# Patient Record
Sex: Male | Born: 1945 | Race: White | Hispanic: No | Smoking: Never smoker
Health system: Southern US, Community
[De-identification: ages and names within clinical notes are randomized; demographics above are authoritative.]

## PROBLEM LIST (undated history)

## (undated) DIAGNOSIS — M4800 Spinal stenosis, site unspecified: Secondary | ICD-10-CM

## (undated) DIAGNOSIS — C61 Malignant neoplasm of prostate: Secondary | ICD-10-CM

## (undated) DIAGNOSIS — J309 Allergic rhinitis, unspecified: Secondary | ICD-10-CM

## (undated) DIAGNOSIS — E119 Type 2 diabetes mellitus without complications: Secondary | ICD-10-CM

## (undated) DIAGNOSIS — I2510 Atherosclerotic heart disease of native coronary artery without angina pectoris: Secondary | ICD-10-CM

## (undated) DIAGNOSIS — N529 Male erectile dysfunction, unspecified: Secondary | ICD-10-CM

## (undated) DIAGNOSIS — N62 Hypertrophy of breast: Secondary | ICD-10-CM

## (undated) DIAGNOSIS — I10 Essential (primary) hypertension: Secondary | ICD-10-CM

## (undated) DIAGNOSIS — K219 Gastro-esophageal reflux disease without esophagitis: Secondary | ICD-10-CM

## (undated) DIAGNOSIS — Z9289 Personal history of other medical treatment: Secondary | ICD-10-CM

## (undated) DIAGNOSIS — E785 Hyperlipidemia, unspecified: Secondary | ICD-10-CM

## (undated) DIAGNOSIS — E669 Obesity, unspecified: Secondary | ICD-10-CM

## (undated) DIAGNOSIS — M1990 Unspecified osteoarthritis, unspecified site: Secondary | ICD-10-CM

## (undated) HISTORY — DX: Personal history of other medical treatment: Z92.89

## (undated) HISTORY — DX: Allergic rhinitis, unspecified: J30.9

## (undated) HISTORY — DX: Unspecified osteoarthritis, unspecified site: M19.90

## (undated) HISTORY — DX: Hypertrophy of breast: N62

## (undated) HISTORY — DX: Obesity, unspecified: E66.9

## (undated) HISTORY — PX: LAPAROSCOPIC CHOLECYSTECTOMY: SUR755

## (undated) HISTORY — DX: Male erectile dysfunction, unspecified: N52.9

## (undated) HISTORY — DX: Hyperlipidemia, unspecified: E78.5

## (undated) HISTORY — DX: Atherosclerotic heart disease of native coronary artery without angina pectoris: I25.10

## (undated) HISTORY — DX: Malignant neoplasm of prostate: C61

## (undated) HISTORY — DX: Spinal stenosis, site unspecified: M48.00

---

## 2001-06-19 ENCOUNTER — Encounter: Payer: Self-pay | Admitting: Emergency Medicine

## 2001-06-19 ENCOUNTER — Emergency Department (HOSPITAL_COMMUNITY): Admission: EM | Admit: 2001-06-19 | Discharge: 2001-06-19 | Payer: Self-pay | Admitting: Emergency Medicine

## 2004-06-02 ENCOUNTER — Ambulatory Visit (HOSPITAL_COMMUNITY): Admission: RE | Admit: 2004-06-02 | Discharge: 2004-06-02 | Payer: Self-pay | Admitting: Gastroenterology

## 2008-12-03 ENCOUNTER — Observation Stay (HOSPITAL_COMMUNITY): Admission: EM | Admit: 2008-12-03 | Discharge: 2008-12-04 | Payer: Self-pay | Admitting: Emergency Medicine

## 2008-12-03 ENCOUNTER — Ambulatory Visit: Payer: Self-pay | Admitting: Internal Medicine

## 2009-07-20 ENCOUNTER — Emergency Department (HOSPITAL_COMMUNITY): Admission: EM | Admit: 2009-07-20 | Discharge: 2009-07-21 | Payer: Self-pay | Admitting: Emergency Medicine

## 2009-07-31 ENCOUNTER — Inpatient Hospital Stay (HOSPITAL_COMMUNITY): Admission: EM | Admit: 2009-07-31 | Discharge: 2009-08-02 | Payer: Self-pay | Admitting: Emergency Medicine

## 2009-07-31 ENCOUNTER — Encounter (INDEPENDENT_AMBULATORY_CARE_PROVIDER_SITE_OTHER): Payer: Self-pay | Admitting: General Surgery

## 2011-03-17 LAB — COMPREHENSIVE METABOLIC PANEL
ALT: 229 U/L — ABNORMAL HIGH (ref 0–53)
AST: 58 U/L — ABNORMAL HIGH (ref 0–37)
Albumin: 2.8 g/dL — ABNORMAL LOW (ref 3.5–5.2)
Albumin: 3 g/dL — ABNORMAL LOW (ref 3.5–5.2)
Albumin: 3.7 g/dL (ref 3.5–5.2)
Alkaline Phosphatase: 121 U/L — ABNORMAL HIGH (ref 39–117)
Alkaline Phosphatase: 89 U/L (ref 39–117)
BUN: 20 mg/dL (ref 6–23)
CO2: 23 mEq/L (ref 19–32)
CO2: 27 mEq/L (ref 19–32)
Calcium: 8.4 mg/dL (ref 8.4–10.5)
Chloride: 102 mEq/L (ref 96–112)
Creatinine, Ser: 1.26 mg/dL (ref 0.4–1.5)
GFR calc Af Amer: >60 mL/min (ref >60)
GFR calc non Af Amer: 51 mL/min — ABNORMAL LOW (ref >60)
GFR calc non Af Amer: 58 mL/min — ABNORMAL LOW (ref >60)
Glucose, Bld: 182 mg/dL — ABNORMAL HIGH (ref 70–99)
Potassium: 3.4 mEq/L — ABNORMAL LOW (ref 3.5–5.1)
Potassium: 3.8 mEq/L (ref 3.5–5.1)
Sodium: 143 mEq/L (ref 135–145)
Total Bilirubin: 3.5 mg/dL — ABNORMAL HIGH (ref 0.3–1.2)
Total Protein: 6.2 g/dL (ref 6.0–8.3)

## 2011-03-17 LAB — CBC
HCT: 34.4 % — ABNORMAL LOW (ref 39.0–52.0)
MCHC: 34.9 g/dL (ref 30.0–36.0)
MCV: 93.2 fL (ref 78.0–100.0)
Platelets: 163 10*3/uL (ref 150–400)
Platelets: 165 10*3/uL (ref 150–400)
WBC: 13.4 10*3/uL — ABNORMAL HIGH (ref 4.0–10.5)

## 2011-03-17 LAB — DIFFERENTIAL
Lymphocytes Relative: 5 % — ABNORMAL LOW (ref 12–46)
Lymphs Abs: 0.6 10*3/uL — ABNORMAL LOW (ref 0.7–4.0)
Neutrophils Relative %: 90 % — ABNORMAL HIGH (ref 43–77)

## 2011-03-17 LAB — GLUCOSE, CAPILLARY

## 2011-03-17 LAB — PROTIME-INR
INR: 1.3 (ref 0.00–1.49)
Prothrombin Time: 16.5 seconds — ABNORMAL HIGH (ref 11.6–15.2)

## 2011-03-17 LAB — POCT CARDIAC MARKERS: Troponin i, poc: <0.05 ng/mL (ref 0.00–0.09)

## 2011-04-24 NOTE — H&P (Signed)
NAME:  Andrew Williamson, Andrew Williamson NO.:  [id number]   MEDICAL RECORD NO.:  [id number]          PATIENT TYPE:  INP   LOCATION:  5127                         FACILITY:  [facility]   PHYSICIAN:  Cherylynn Ridges, M.D.    DATE OF BIRTH:  1946-07-08   DATE OF ADMISSION:  07/31/2009  DATE OF DISCHARGE:                              HISTORY & PHYSICAL   IDENTIFICATION/CHIEF COMPLAINT:  The patient is a 65 year old gentleman  with abdominal pain in the epigastrium and possible cholangitis and  acute cholecystitis.   HISTORY OF PRESENT ILLNESS:  The patient has had 3 episodes of  epigastric abdominal pain with nausea, fevers, and chills.  The first  one was in December 2009.  His last episode was about 2 weeks ago where  he ended up going through the ER at [facility] where  laboratory studies sent over from the outpatient urgent care center did  not demonstrate any abnormality and then his last attack started  yesterday evening,  worsened,  until today he went in to be seen in the  urgent care center at the [facility] at [facility], was  found to have a fever and LFTs demonstrating abnormal LFTs.  He was  tachycardic.  He had a fever of 101 and he was sent to [facility] for a CT scan and subsequent workup.   His past medical history is significant only for:  1. Hypertension.  2. Diabetes for which he is currently not taking any medications.  3. Reflux.   PAST SURGICAL HISTORY:  He has only had wisdom teeth removed and he has  had a colonoscopy.  No other operation.   ALLERGIES:  He is allergic to  ? No other medication.   CURRENT MEDICATIONS:  1. Lisinopril 10 mg a day.  2. Omeprazole.  3. Baby aspirin a day.   REVIEW OF SYSTEMS:  The patient has had dark and maybe yellowish stools,  but cannot confirm that.  He has possibly scleral icterus.  He has had  some chills and fevers up to 101.7 taken by him at home.   PHYSICAL EXAMINATION:  VITAL  SIGNS:  On exam here, his maximum  temperature has been 101.0, he is currently 99.7.  He is complaining of  no pain, maybe a 1-2/10.  HEENT:  He is normocephalic and atraumatic.  He does seem to have a mild  scleral icterus.  NECK:  Supple.  No bruits and no palpable masses.  LUNGS:  Clear to auscultation.  CARDIAC:  Regular rhythm.  He is tachycardic at about 102.  No murmurs,  gallops, or heaves.  ABDOMEN:  Soft, nontender.  No palpable masses.  Nonpalpable spleen or  liver.  No rebound or guarding.  No palpable hernias.  RECTAL:  Not performed.  NEUROLOGIC:  Cranial nerves II through XII are grossly intact.  Deep  tendon reflexes are symmetrical and brisk bilaterally.  EXTREMITIES:  He has got no cyanosis, clubbing, or edema.  He has got no  adenopathy.  PSYCHIATRIC:  He has got normal mentation, normal mood.  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   LABORATORY STUDIES:  His LFTs are elevated globally with an alk phos  over 140, AST and ALT which are elevated, and a total bilirubin 3.5,  lipase is normal.  His white count was 81191 done at the urgent care  clinic, hemoglobin 13, hematocrit 40.  CT scan demonstrates some  pericholecystic fluid and also some fluid around his appendix which  could be run down from the right upper quadrant.   IMPRESSION:  Based on my examination, the patient does not have  peritonitis, probably has some subacute cholecystitis __________  cholangitis with a rising LFTs and total bilirubin.   PLAN:  Get a GI consultation in case the patient needs preoperative  ERCP.  There is no indication that his duct is dilated on the  ultrasound, however, an official report is pending.  CT scan  demonstrating fluid in and around the gallbladder and also in the right  lower quadrant seems to probably be run down from the gallbladder fossa;  however, the appendix can be visualized laparoscopically when the  patient's gallbladder is removed.  The plan is to get GI to  see the  patient and then he will need a laparoscopic cholecystectomy.  I do  think that it can be done laparoscopically.  In the meantime, we will  place him on IV antibiotics.  He has got a doses of ciprofloxacin and  Flagyl.  I think the Zosyn would be fine and we will only put him on  single therapy of Zosyn.  We will see him again tomorrow.  He can have  clear liquids up until midnight, then he can be n.p.o. after midnight  except for sips of his medication.      Cherylynn Ridges, M.D.  Electronically Signed     JOW/MEDQ  D:  07/31/2009  T:  08/01/2009  Job:  [id number]

## 2011-04-24 NOTE — Op Note (Signed)
NAME:  Andrew Williamson, FAHR NO.:  [id number]   MEDICAL RECORD NO.:  [id number]           PATIENT TYPE:   LOCATION:                                 FACILITY:   PHYSICIAN:  Gabrielle Dare. Janee Morn, M.D.DATE OF BIRTH:  12-07-1946   DATE OF PROCEDURE:  08/01/2009  DATE OF DISCHARGE:                               OPERATIVE REPORT   PREOPERATIVE DIAGNOSES:  1. Acute cholecystitis.  2. Possible choledocholithiasis.   POSTOPERATIVE DIAGNOSIS:  Acute cholecystitis.   PROCEDURE:  Laparoscopic cholecystectomy with intraoperative  cholangiogram.   SURGEON:  Gabrielle Dare. Janee Morn, MD   ASSISTANT:  Brayton El, PA-C   ANESTHESIA:  General endotracheal.   HISTORY OF PRESENT ILLNESS:  Mr. Reuss is a 65 year old gentleman who  was admitted to the hospital yesterday with abdominal pain.  He had  elevated transaminases and elevated bilirubin.  He was treated with  intravenous antibiotics and bowel rest.  GI consultation was requested.  This morning, his transaminases are down somewhat, but still abnormal.  Bilirubin is about the same.  We are proceeding with laparoscopic  cholecystectomy with intraoperative cholangiogram for acute  cholecystitis and possible choledocholithiasis.   PROCEDURE IN DETAIL:  Informed consent was obtained.  The patient was  identified in the preop holding area.  He was currently receiving  intravenous antibiotics.  He was brought to the operating room.  General  endotracheal anesthesia was administered by the anesthesia staff.  His  abdomen was prepped and draped in sterile fashion.  We performed a time-  out procedure.  Infraumbilical region was infiltrated with 0.25%  Marcaine with epinephrine.  Infraumbilical incision was made.  Subcutaneous tissues were dissected down revealing the anterior fascia.  This was divided sharply along the midline.  The peritoneal cavity was  entered under direct vision without difficulty. A 0 Vicryl pursestring  suture was  placed around the fascial opening and a Hasson trocar was  inserted into the abdomen.  The abdomen was insufflated with carbon  dioxide in standard fashion.  Under direct vision, an 11-mm epigastric  and two 5-mm lateral ports were placed.  0.25% Marcaine with epinephrine  was used in all port sites.  Laparoscopic exploration revealed a lot of  filmy omental adhesions on the dome of the gallbladder.  These were  carefully taken down using sharp dissection and cautery to achieve good  hemostasis.  This revealed the dome of the gallbladder, which was  retracted superomedially.  There were further filmy omental adhesions  were present along the length of the gallbladder.  These were gradually  and carefully swept down revealing the infundibulum, this was retracted  inferolaterally.  Further dissection swept off the other adhesions  exposed the tapering portion of the infundibulum.  Careful dissection  was begun laterally and progressed medially identifying the cystic duct  and the cystic artery.  Dissection continued until a distal large window  was created between the infundibulum of the gallbladder, cystic duct,  and the liver.  Once we had excellent visualization, a clip was placed  on the infundibular cystic duct junction.  Small nick was made  in the  cystic duct, it was milked out, and there was a bit of grunge in the  bile, but there were no large stones.  Reddick cholangiogram catheter  was inserted and intraoperative cholangiogram was obtained demonstrating  no common bile duct filling defects and good flow of contrast into the  duodenum.  Cholangiogram catheter was removed.  Three clips were  initially placed proximally on the cystic duct and then it was divided.  Upon inspection, however, these clips did not completely traverse with  the cystic duct, so the middle one was removed and cystic duct was  closed with an Endoloop of 0 PDS.  This achieved excellent closure.  At  this time,  the cystic artery was clipped twice proximally, once  distally, and divided.  Gallbladder was then taken off the liver bed  with Bovie cautery achieving excellent hemostasis along the way.  Gallbladder was placed in an Endocatch bag and removed from the abdomen  via the infraumbilical port site.  Liver bed was rechecked and  meticulous hemostasis was ensured.  Clips remained in good position and  the bed was dry.  The area was copiously irrigated and the irrigation  fluid returned clear.  There were 2 small areas of bleeding on the  omentum from where the adhesions had been, these were carefully  cauterized.  Checking underlying structures, the liver bed was rechecked  and was completely dry.  The remainder of the irrigation fluid was  evacuated, the Hasson trocar was removed, and the infraumbilical fascia  was closed under direct vision by tying a 0 Vicryl pursestring suture.  An additional figure-of-eight 0 Vicryl pursestring suture was placed  achieving excellent closure.  The pneumoperitoneum was then released  after removing the ports under direct vision.  All 4 wounds were  copiously irrigated, the skin of the infraumbilical and epigastric  wounds were closed with running 4-0 Vicryl subcuticular stitch, and all  4 wounds were closed with Dermabond.  Sponge, needle, and instrument  counts were all correct.  The patient tolerated the procedure well  without apparent complication.  He was taken to recovery room in stable  condition.  We will check followup CMET in the morning.      Gabrielle Dare Janee Morn, M.D.  Electronically Signed     BET/MEDQ  D:  08/01/2009  T:  08/02/2009  Job:  [id number]   cc:   Venita Lick. Russella Dar, MD, Clementeen Graham

## 2011-04-24 NOTE — H&P (Signed)
NAME:  Andrew Williamson, QUAM NO.:  [id number]   MEDICAL RECORD NO.:  [id number]          PATIENT TYPE:  EMS   LOCATION:  ED                           FACILITY:  [facility]   PHYSICIAN:  Gardiner Barefoot, MD    DATE OF BIRTH:  1946-10-31   DATE OF ADMISSION:  12/03/2008  DATE OF DISCHARGE:                              HISTORY & PHYSICAL   CHIEF COMPLAINTS:  Fever and chills.   HISTORY OF PRESENT ILLNESS:  This is a 65 year old male with a history  of hypertension and diet-controlled diabetes who presented with a fever  102 with chills, started the evening of presentation.  The patient  complains of epigastric bloating, which is consistent with his acid  reflux and some back soreness bilaterally with lower back, which he  equates to playing football with some other family members.  The patient  reports no sick contacts and no travel.  The patient did have emesis one  time when he presented to the emergency room. The patient, otherwise,  feels well and at this time is not having any chills.  The patient also  noted his blood pressure was elevated at home, higher than his typical  baseline.  The patient reports he has not heard of any other family  members that have been sick.   PAST MEDICAL HISTORY:  1. Hypertension.  2. Diabetes.  3. Hypercholesterolemia.  4. Acid reflux disease.   MEDICATIONS:  1. Lisinopril 10 mg daily.  2. Protonix 40 mg daily.  3. Aspirin 81 mg daily.  4. Pravastatin 10 mg daily.   ALLERGIES:  NO KNOWN DRUG ALLERGIES.   FAMILY HISTORY:  Noncontributory.   SOCIAL HISTORY:  No tobacco and occasional alcohol.   REVIEW OF SYSTEMS:  Negative except as per history of present illness.   PHYSICAL EXAM:  VITALS:  Temperature is 102.4, pulse 110-131,  respirations 18-20, blood pressure was 160/102 initially and most recent  is 107/42, O2 sats 95%.  GENERAL:  The patient is awake, alert, oriented x3, appears in no acute  distress does not appear ill,  quite pleasant.  CARDIOVASCULAR:  Regular  rate and rhythm with no murmurs, rubs or gallops.  LUNGS: Clear to auscultation bilaterally.  ABDOMEN: Soft, nontender, nondistended with positive bowel sounds.  No  hepatosplenomegaly.  EXTREMITIES:  No cyanosis, clubbing or edema.   LABORATORY DATA:  Sodium 138, potassium 4.8, chloride 105, bicarb 25,  BUN 22, creatinine 1.5, glucose 147, albumin 4.4, AST 287, ALT 296, alk  phos is 71, total bili is 1.9.  WBCs 12.4 with 89% neutrophils,  hemoglobin 15, lipase 17.  Ultrasound was normal.   IMPRESSION AND PLAN:  1. Transaminitis.  This may be secondary to a gastroenteritis process      with elevated AST, ALT.  He is minimally elevated T bili and alk      phos is normal so unlikely a gallbladder process.  His ultrasound      is normal as well.  I suspect this is a transient process as he now      obviously feels better.  Other  possibilities include medications,      particularly pravastatin, which can cause transaminitis as well and      therefore, if it continues to increase, this may need to be held.      Of note, the patient did complain of some back pain; however, it      does not radiate from the epigastric pain does not radiate.  It      does feel like a muscular pain that he equates to having done      exercise with his family members during the daytime.  2. Hypertension.  The patient's blood pressure has been elevated much      above his typical baseline, more or less tachycardic. I suspect      this is secondary to his acute illness; however, this will be      monitored today well.  He will continue his home medications.  3. Diabetes.  This is diet-controlled, his blood sugar is mildly      elevated.  We will continue to monitor that; however, will hold off      on any new medications.  4. DVT prophylaxis with Lovenox.      Gardiner Barefoot, MD  Electronically Signed     RWC/MEDQ  D:  12/03/2008  T:  12/03/2008  Job:   [id number]   cc:   Oley Balm. Georgina Pillion, M.D.  Fax: [phone]

## 2011-04-24 NOTE — Discharge Summary (Signed)
NAME:  [name], [name]:  [id number]   MEDICAL RECORD NO.:  [id number]          PATIENT TYPE:  INP   LOCATION:  5127                         FACILITY:  [facility]   PHYSICIAN:  Gabrielle Dare. Janee Morn, M.D.DATE OF BIRTH:  1946-07-04   DATE OF ADMISSION:  07/31/2009  DATE OF DISCHARGE:                               DISCHARGE SUMMARY   DISCHARGING PHYSICIAN:  Gabrielle Dare. Janee Morn, MD   HISTORY OF PRESENT ILLNESS:  Andrew Williamson is a patient who is a 65-year-  old male who presented with abdominal pain and nausea and vomiting with  fever and chills.  He was worked up in the emergency room including a CT  scan, which subsequently showed evidence of pericholecystic fluid and  elevated white blood cell count of 17.9 indicating acute cholecystitis.  However, his LFTs were elevated including bilirubin of 3.5.  It was  thought that Gastroenterology may need to be consulted for preoperative  ERCP.  There was no indication that his duct was dilated; however, an  official report was pending and GI was supposedly consulted by the  emergency room physician.   SUMMARY OF HOSPITAL COURSE:  The patient was admitted on July 31, 2009, started on IV fluids and antibiotics; however, on the morning of  August 01, 2009, after seeing the patient, gastroenterologist had not  seen the patient yet, the patient was feeling better and his enzymes  were slightly better.  The decision at that point was made to just go  ahead and perform laparoscopic cholecystectomy with intraoperative  cholangiogram and if necessary postoperative ERCP could be performed.   Procedure performed on August 01, 2009, was laparoscopic cholecystectomy  with intraoperative cholangiogram.  No evidence of common duct  obstruction was identified during that procedure.  The patient tolerated  this well and was admitted to the floor in a stable condition.  Postoperatively, the patient has had no complications.  His total  bilirubin  has dropped down to 1.3 and his other liver enzymes are  essentially normalized.  He is feeling well and tolerating regular diet  and is determined to be stable for discharge as of August 02, 2009.   DISCHARGE DIAGNOSIS:  Acute cholecystitis suspicious for passed common  duct stone, status post laparoscopic cholecystectomy.   PLAN:  The patient will follow up in our office in approximately 2  weeks.  We will otherwise follow discharge instructions and activity  restrictions and he will be given prescription for Percocet pain  medicine to be used as needed for severe pain.     Brayton El, PA-C      Gabrielle Dare. Janee Morn, M.D.  Electronically Signed   KB/MEDQ  D:  08/02/2009  T:  08/02/2009  Job:  [id number]

## 2011-04-27 NOTE — Op Note (Signed)
NAME:  Andrew Williamson, Andrew Williamson                        ACCOUNT NO.:  [id number]   MEDICAL RECORD NO.:  [id number]                   PATIENT TYPE:  AMB   LOCATION:  ENDO                                 FACILITY:  [facility]   PHYSICIAN:  John C. Madilyn Fireman, M.D.                 DATE OF BIRTH:  08-03-1946   DATE OF PROCEDURE:  06/02/2004  DATE OF DISCHARGE:                                 OPERATIVE REPORT   PROCEDURE:  Colonoscopy.   INDICATIONS FOR PROCEDURE:  Average risk colon cancer screening.   DESCRIPTION OF PROCEDURE:  The patient was placed in the left lateral  decubitus position and placed on the pulse monitor with continuous low-flow  oxygen delivered by nasal cannula.  He was sedated with 75 mcg IV fentanyl  and 8 mg IV Versed.  The Olympus video colonoscope was inserted into the  rectum and advanced to the cecum, confirmed by transillumination at  McBurney's point and visualization of the ileocecal valve and appendiceal  orifice.  Prep was excellent.  The cecum, ascending, transverse, descending,  and sigmoid colon all appeared normal with no masses, polyps, diverticula,  or other mucosal abnormalities.  The rectum likewise appeared normal, and  retroflexed view of the anus revealed no obvious internal hemorrhoids.  The  scope was then withdrawn, and the patient returned to the recovery room in  stable condition.  He tolerated the procedure well, and there were no  immediate complications.   IMPRESSION:  Normal colonoscopy.   PLAN:  Next colon screening by sigmoidoscopy in 5 years.                                               John C. Madilyn Fireman, M.D.    JCH/MEDQ  D:  06/02/2004  T:  06/02/2004  Job:  [id number]   cc:   Oley Balm. Georgina Pillion, M.D.  [address]  [place]  [place] [address]  Fax: [phone]

## 2011-09-14 LAB — CBC
MCHC: 34.5 g/dL (ref 30.0–36.0)
MCV: 92.4 fL (ref 78.0–100.0)
MCV: 92.8 fL (ref 78.0–100.0)
RBC: 4.72 MIL/uL (ref 4.22–5.81)
RDW: 13.3 % (ref 11.5–15.5)
WBC: 12.4 10*3/uL — ABNORMAL HIGH (ref 4.0–10.5)

## 2011-09-14 LAB — URINALYSIS, ROUTINE W REFLEX MICROSCOPIC
Glucose, UA: NEGATIVE mg/dL
Hgb urine dipstick: NEGATIVE
Protein, ur: NEGATIVE mg/dL
Specific Gravity, Urine: 1.025 (ref 1.005–1.030)
pH: 6 (ref 5.0–8.0)

## 2011-09-14 LAB — DIFFERENTIAL
Eosinophils Absolute: 0 10*3/uL (ref 0.0–0.7)
Eosinophils Absolute: 0.1 10*3/uL (ref 0.0–0.7)
Lymphocytes Relative: 20 % (ref 12–46)
Lymphocytes Relative: 4 % — ABNORMAL LOW (ref 12–46)
Lymphs Abs: 0.5 10*3/uL — ABNORMAL LOW (ref 0.7–4.0)
Lymphs Abs: 1.5 10*3/uL (ref 0.7–4.0)
Monocytes Relative: 13 % — ABNORMAL HIGH (ref 3–12)
Monocytes Relative: 6 % (ref 3–12)
Neutrophils Relative %: 66 % (ref 43–77)
Neutrophils Relative %: 89 % — ABNORMAL HIGH (ref 43–77)

## 2011-09-14 LAB — COMPREHENSIVE METABOLIC PANEL
ALT: 138 U/L — ABNORMAL HIGH (ref 0–53)
ALT: 296 U/L — ABNORMAL HIGH (ref 0–53)
AST: 287 U/L — ABNORMAL HIGH (ref 0–37)
AST: 50 U/L — ABNORMAL HIGH (ref 0–37)
Albumin: 4.4 g/dL (ref 3.5–5.2)
CO2: 25 mEq/L (ref 19–32)
Calcium: 8.7 mg/dL (ref 8.4–10.5)
Chloride: 105 mEq/L (ref 96–112)
Creatinine, Ser: 1.21 mg/dL (ref 0.4–1.5)
GFR calc Af Amer: >60 mL/min (ref >60)
GFR calc Af Amer: >60 mL/min (ref >60)
GFR calc non Af Amer: >60 mL/min (ref >60)
GFR calc non Af Amer: >60 mL/min (ref >60)
Glucose, Bld: 122 mg/dL — ABNORMAL HIGH (ref 70–99)
Potassium: 4.8 mEq/L (ref 3.5–5.1)
Sodium: 138 mEq/L (ref 135–145)
Sodium: 141 mEq/L (ref 135–145)
Total Bilirubin: 1.9 mg/dL — ABNORMAL HIGH (ref 0.3–1.2)
Total Protein: 5.9 g/dL — ABNORMAL LOW (ref 6.0–8.3)

## 2012-01-10 DIAGNOSIS — B351 Tinea unguium: Secondary | ICD-10-CM | POA: Diagnosis not present

## 2012-01-10 DIAGNOSIS — R7309 Other abnormal glucose: Secondary | ICD-10-CM | POA: Diagnosis not present

## 2012-01-10 DIAGNOSIS — Z125 Encounter for screening for malignant neoplasm of prostate: Secondary | ICD-10-CM | POA: Diagnosis not present

## 2012-01-10 DIAGNOSIS — Z0000 Encounter for general adult medical examination without abnormal findings: Secondary | ICD-10-CM | POA: Diagnosis not present

## 2012-01-10 DIAGNOSIS — I10 Essential (primary) hypertension: Secondary | ICD-10-CM | POA: Diagnosis not present

## 2012-01-10 DIAGNOSIS — E782 Mixed hyperlipidemia: Secondary | ICD-10-CM | POA: Diagnosis not present

## 2012-01-15 DIAGNOSIS — Z1211 Encounter for screening for malignant neoplasm of colon: Secondary | ICD-10-CM | POA: Diagnosis not present

## 2012-02-07 DIAGNOSIS — B351 Tinea unguium: Secondary | ICD-10-CM | POA: Diagnosis not present

## 2012-03-04 DIAGNOSIS — H35349 Macular cyst, hole, or pseudohole, unspecified eye: Secondary | ICD-10-CM | POA: Diagnosis not present

## 2012-03-04 DIAGNOSIS — H5210 Myopia, unspecified eye: Secondary | ICD-10-CM | POA: Diagnosis not present

## 2012-03-04 DIAGNOSIS — E119 Type 2 diabetes mellitus without complications: Secondary | ICD-10-CM | POA: Diagnosis not present

## 2012-03-04 DIAGNOSIS — H43399 Other vitreous opacities, unspecified eye: Secondary | ICD-10-CM | POA: Diagnosis not present

## 2012-03-20 DIAGNOSIS — B351 Tinea unguium: Secondary | ICD-10-CM | POA: Diagnosis not present

## 2012-07-07 DIAGNOSIS — E119 Type 2 diabetes mellitus without complications: Secondary | ICD-10-CM | POA: Diagnosis not present

## 2012-07-07 DIAGNOSIS — L259 Unspecified contact dermatitis, unspecified cause: Secondary | ICD-10-CM | POA: Diagnosis not present

## 2012-07-07 DIAGNOSIS — K219 Gastro-esophageal reflux disease without esophagitis: Secondary | ICD-10-CM | POA: Diagnosis not present

## 2012-07-07 DIAGNOSIS — I10 Essential (primary) hypertension: Secondary | ICD-10-CM | POA: Diagnosis not present

## 2012-09-08 DIAGNOSIS — Z23 Encounter for immunization: Secondary | ICD-10-CM | POA: Diagnosis not present

## 2012-09-23 DIAGNOSIS — I10 Essential (primary) hypertension: Secondary | ICD-10-CM | POA: Diagnosis not present

## 2012-10-20 DIAGNOSIS — E119 Type 2 diabetes mellitus without complications: Secondary | ICD-10-CM | POA: Diagnosis not present

## 2012-10-20 DIAGNOSIS — H35349 Macular cyst, hole, or pseudohole, unspecified eye: Secondary | ICD-10-CM | POA: Diagnosis not present

## 2013-01-12 DIAGNOSIS — I10 Essential (primary) hypertension: Secondary | ICD-10-CM | POA: Diagnosis not present

## 2013-01-12 DIAGNOSIS — Z125 Encounter for screening for malignant neoplasm of prostate: Secondary | ICD-10-CM | POA: Diagnosis not present

## 2013-01-12 DIAGNOSIS — Z23 Encounter for immunization: Secondary | ICD-10-CM | POA: Diagnosis not present

## 2013-01-12 DIAGNOSIS — Z0000 Encounter for general adult medical examination without abnormal findings: Secondary | ICD-10-CM | POA: Diagnosis not present

## 2013-01-12 DIAGNOSIS — E119 Type 2 diabetes mellitus without complications: Secondary | ICD-10-CM | POA: Diagnosis not present

## 2013-01-12 DIAGNOSIS — E785 Hyperlipidemia, unspecified: Secondary | ICD-10-CM | POA: Diagnosis not present

## 2013-03-12 DIAGNOSIS — E119 Type 2 diabetes mellitus without complications: Secondary | ICD-10-CM | POA: Diagnosis not present

## 2013-03-12 DIAGNOSIS — H5200 Hypermetropia, unspecified eye: Secondary | ICD-10-CM | POA: Diagnosis not present

## 2013-03-12 DIAGNOSIS — H52229 Regular astigmatism, unspecified eye: Secondary | ICD-10-CM | POA: Diagnosis not present

## 2013-03-12 DIAGNOSIS — H35349 Macular cyst, hole, or pseudohole, unspecified eye: Secondary | ICD-10-CM | POA: Diagnosis not present

## 2013-03-12 DIAGNOSIS — H5210 Myopia, unspecified eye: Secondary | ICD-10-CM | POA: Diagnosis not present

## 2013-03-12 DIAGNOSIS — H43399 Other vitreous opacities, unspecified eye: Secondary | ICD-10-CM | POA: Diagnosis not present

## 2013-03-12 DIAGNOSIS — H524 Presbyopia: Secondary | ICD-10-CM | POA: Diagnosis not present

## 2013-03-26 DIAGNOSIS — L821 Other seborrheic keratosis: Secondary | ICD-10-CM | POA: Diagnosis not present

## 2013-03-26 DIAGNOSIS — L570 Actinic keratosis: Secondary | ICD-10-CM | POA: Diagnosis not present

## 2013-03-26 DIAGNOSIS — L719 Rosacea, unspecified: Secondary | ICD-10-CM | POA: Diagnosis not present

## 2013-03-26 DIAGNOSIS — L723 Sebaceous cyst: Secondary | ICD-10-CM | POA: Diagnosis not present

## 2013-07-13 DIAGNOSIS — E785 Hyperlipidemia, unspecified: Secondary | ICD-10-CM | POA: Diagnosis not present

## 2013-07-13 DIAGNOSIS — E119 Type 2 diabetes mellitus without complications: Secondary | ICD-10-CM | POA: Diagnosis not present

## 2013-07-13 DIAGNOSIS — K219 Gastro-esophageal reflux disease without esophagitis: Secondary | ICD-10-CM | POA: Diagnosis not present

## 2013-07-13 DIAGNOSIS — I10 Essential (primary) hypertension: Secondary | ICD-10-CM | POA: Diagnosis not present

## 2013-10-27 DIAGNOSIS — Z23 Encounter for immunization: Secondary | ICD-10-CM | POA: Diagnosis not present

## 2014-01-15 DIAGNOSIS — Z23 Encounter for immunization: Secondary | ICD-10-CM | POA: Diagnosis not present

## 2014-01-15 DIAGNOSIS — Z125 Encounter for screening for malignant neoplasm of prostate: Secondary | ICD-10-CM | POA: Diagnosis not present

## 2014-01-15 DIAGNOSIS — I10 Essential (primary) hypertension: Secondary | ICD-10-CM | POA: Diagnosis not present

## 2014-01-15 DIAGNOSIS — E119 Type 2 diabetes mellitus without complications: Secondary | ICD-10-CM | POA: Diagnosis not present

## 2014-01-15 DIAGNOSIS — Z0000 Encounter for general adult medical examination without abnormal findings: Secondary | ICD-10-CM | POA: Diagnosis not present

## 2014-01-15 DIAGNOSIS — Z1331 Encounter for screening for depression: Secondary | ICD-10-CM | POA: Diagnosis not present

## 2014-01-15 DIAGNOSIS — E785 Hyperlipidemia, unspecified: Secondary | ICD-10-CM | POA: Diagnosis not present

## 2014-01-15 DIAGNOSIS — K219 Gastro-esophageal reflux disease without esophagitis: Secondary | ICD-10-CM | POA: Diagnosis not present

## 2014-03-22 DIAGNOSIS — E119 Type 2 diabetes mellitus without complications: Secondary | ICD-10-CM | POA: Diagnosis not present

## 2014-03-22 DIAGNOSIS — H43819 Vitreous degeneration, unspecified eye: Secondary | ICD-10-CM | POA: Diagnosis not present

## 2014-05-07 DIAGNOSIS — J069 Acute upper respiratory infection, unspecified: Secondary | ICD-10-CM | POA: Diagnosis not present

## 2014-06-09 HISTORY — PX: COLONOSCOPY: SHX174

## 2014-06-22 DIAGNOSIS — Z1211 Encounter for screening for malignant neoplasm of colon: Secondary | ICD-10-CM | POA: Diagnosis not present

## 2014-07-19 DIAGNOSIS — Z79899 Other long term (current) drug therapy: Secondary | ICD-10-CM | POA: Diagnosis not present

## 2014-07-19 DIAGNOSIS — E119 Type 2 diabetes mellitus without complications: Secondary | ICD-10-CM | POA: Diagnosis not present

## 2014-07-19 DIAGNOSIS — E785 Hyperlipidemia, unspecified: Secondary | ICD-10-CM | POA: Diagnosis not present

## 2014-07-19 DIAGNOSIS — K219 Gastro-esophageal reflux disease without esophagitis: Secondary | ICD-10-CM | POA: Diagnosis not present

## 2014-07-19 DIAGNOSIS — R972 Elevated prostate specific antigen [PSA]: Secondary | ICD-10-CM | POA: Diagnosis not present

## 2014-07-19 DIAGNOSIS — IMO0002 Reserved for concepts with insufficient information to code with codable children: Secondary | ICD-10-CM | POA: Diagnosis not present

## 2014-07-19 DIAGNOSIS — I10 Essential (primary) hypertension: Secondary | ICD-10-CM | POA: Diagnosis not present

## 2014-09-03 DIAGNOSIS — Z23 Encounter for immunization: Secondary | ICD-10-CM | POA: Diagnosis not present

## 2014-09-20 DIAGNOSIS — M2041 Other hammer toe(s) (acquired), right foot: Secondary | ICD-10-CM | POA: Diagnosis not present

## 2015-01-21 DIAGNOSIS — Z125 Encounter for screening for malignant neoplasm of prostate: Secondary | ICD-10-CM | POA: Diagnosis not present

## 2015-01-21 DIAGNOSIS — K219 Gastro-esophageal reflux disease without esophagitis: Secondary | ICD-10-CM | POA: Diagnosis not present

## 2015-01-21 DIAGNOSIS — Z0000 Encounter for general adult medical examination without abnormal findings: Secondary | ICD-10-CM | POA: Diagnosis not present

## 2015-01-21 DIAGNOSIS — I10 Essential (primary) hypertension: Secondary | ICD-10-CM | POA: Diagnosis not present

## 2015-01-21 DIAGNOSIS — E785 Hyperlipidemia, unspecified: Secondary | ICD-10-CM | POA: Diagnosis not present

## 2015-01-21 DIAGNOSIS — E119 Type 2 diabetes mellitus without complications: Secondary | ICD-10-CM | POA: Diagnosis not present

## 2015-02-28 DIAGNOSIS — E291 Testicular hypofunction: Secondary | ICD-10-CM | POA: Diagnosis not present

## 2015-02-28 DIAGNOSIS — R3912 Poor urinary stream: Secondary | ICD-10-CM | POA: Diagnosis not present

## 2015-02-28 DIAGNOSIS — N401 Enlarged prostate with lower urinary tract symptoms: Secondary | ICD-10-CM | POA: Diagnosis not present

## 2015-02-28 DIAGNOSIS — R972 Elevated prostate specific antigen [PSA]: Secondary | ICD-10-CM | POA: Diagnosis not present

## 2015-03-10 DIAGNOSIS — E291 Testicular hypofunction: Secondary | ICD-10-CM | POA: Diagnosis not present

## 2015-03-10 DIAGNOSIS — R972 Elevated prostate specific antigen [PSA]: Secondary | ICD-10-CM | POA: Diagnosis not present

## 2015-03-28 DIAGNOSIS — H43812 Vitreous degeneration, left eye: Secondary | ICD-10-CM | POA: Diagnosis not present

## 2015-03-28 DIAGNOSIS — H2513 Age-related nuclear cataract, bilateral: Secondary | ICD-10-CM | POA: Diagnosis not present

## 2015-03-28 DIAGNOSIS — E119 Type 2 diabetes mellitus without complications: Secondary | ICD-10-CM | POA: Diagnosis not present

## 2015-04-14 DIAGNOSIS — L821 Other seborrheic keratosis: Secondary | ICD-10-CM | POA: Diagnosis not present

## 2015-04-14 DIAGNOSIS — L718 Other rosacea: Secondary | ICD-10-CM | POA: Diagnosis not present

## 2015-04-14 DIAGNOSIS — L570 Actinic keratosis: Secondary | ICD-10-CM | POA: Diagnosis not present

## 2015-04-19 DIAGNOSIS — E785 Hyperlipidemia, unspecified: Secondary | ICD-10-CM | POA: Diagnosis not present

## 2015-04-19 DIAGNOSIS — E1165 Type 2 diabetes mellitus with hyperglycemia: Secondary | ICD-10-CM | POA: Diagnosis not present

## 2015-04-19 DIAGNOSIS — I10 Essential (primary) hypertension: Secondary | ICD-10-CM | POA: Diagnosis not present

## 2015-04-19 DIAGNOSIS — E119 Type 2 diabetes mellitus without complications: Secondary | ICD-10-CM | POA: Diagnosis not present

## 2015-05-16 DIAGNOSIS — C61 Malignant neoplasm of prostate: Secondary | ICD-10-CM | POA: Diagnosis not present

## 2015-05-16 DIAGNOSIS — R972 Elevated prostate specific antigen [PSA]: Secondary | ICD-10-CM | POA: Diagnosis not present

## 2015-06-09 DIAGNOSIS — C61 Malignant neoplasm of prostate: Secondary | ICD-10-CM | POA: Diagnosis not present

## 2015-06-09 DIAGNOSIS — R3912 Poor urinary stream: Secondary | ICD-10-CM | POA: Diagnosis not present

## 2015-06-09 DIAGNOSIS — N138 Other obstructive and reflux uropathy: Secondary | ICD-10-CM | POA: Diagnosis not present

## 2015-06-09 DIAGNOSIS — N401 Enlarged prostate with lower urinary tract symptoms: Secondary | ICD-10-CM | POA: Diagnosis not present

## 2015-06-27 ENCOUNTER — Other Ambulatory Visit: Payer: Self-pay | Admitting: Urology

## 2015-07-22 DIAGNOSIS — E119 Type 2 diabetes mellitus without complications: Secondary | ICD-10-CM | POA: Diagnosis not present

## 2015-07-22 DIAGNOSIS — I10 Essential (primary) hypertension: Secondary | ICD-10-CM | POA: Diagnosis not present

## 2015-07-22 DIAGNOSIS — C61 Malignant neoplasm of prostate: Secondary | ICD-10-CM | POA: Diagnosis not present

## 2015-07-26 DIAGNOSIS — C61 Malignant neoplasm of prostate: Secondary | ICD-10-CM | POA: Diagnosis not present

## 2015-07-26 DIAGNOSIS — M6281 Muscle weakness (generalized): Secondary | ICD-10-CM | POA: Diagnosis not present

## 2015-07-28 DIAGNOSIS — Z01818 Encounter for other preprocedural examination: Secondary | ICD-10-CM | POA: Diagnosis not present

## 2015-07-28 DIAGNOSIS — C61 Malignant neoplasm of prostate: Secondary | ICD-10-CM | POA: Diagnosis not present

## 2015-08-04 DIAGNOSIS — C61 Malignant neoplasm of prostate: Secondary | ICD-10-CM | POA: Diagnosis not present

## 2015-08-04 DIAGNOSIS — M6281 Muscle weakness (generalized): Secondary | ICD-10-CM | POA: Diagnosis not present

## 2015-08-05 ENCOUNTER — Encounter (HOSPITAL_COMMUNITY): Payer: Self-pay

## 2015-08-05 ENCOUNTER — Encounter (HOSPITAL_COMMUNITY)
Admission: RE | Admit: 2015-08-05 | Discharge: 2015-08-05 | Disposition: A | Discharge status: Home / Self Care | Payer: Medicare Other | Source: Ambulatory Visit | Attending: Urology | Admitting: Urology

## 2015-08-05 DIAGNOSIS — C61 Malignant neoplasm of prostate: Secondary | ICD-10-CM | POA: Diagnosis not present

## 2015-08-05 DIAGNOSIS — Z01818 Encounter for other preprocedural examination: Secondary | ICD-10-CM | POA: Insufficient documentation

## 2015-08-05 HISTORY — DX: Essential (primary) hypertension: I10

## 2015-08-05 HISTORY — DX: Type 2 diabetes mellitus without complications: E11.9

## 2015-08-05 HISTORY — DX: Gastro-esophageal reflux disease without esophagitis: K21.9

## 2015-08-05 LAB — BASIC METABOLIC PANEL
ANION GAP: 6 (ref 5–15)
BUN: 22 mg/dL — ABNORMAL HIGH (ref 6–20)
CALCIUM: 9.4 mg/dL (ref 8.9–10.3)
CO2: 28 mmol/L (ref 22–32)
CREATININE: 1.2 mg/dL (ref 0.61–1.24)
Chloride: 110 mmol/L (ref 101–111)
GFR, EST NON AFRICAN AMERICAN: 60 mL/min — AB (ref >60)
Glucose, Bld: 152 mg/dL — ABNORMAL HIGH (ref 65–99)
Potassium: 4.5 mmol/L (ref 3.5–5.1)
SODIUM: 144 mmol/L (ref 135–145)

## 2015-08-05 LAB — CBC
HEMATOCRIT: 38.6 % — AB (ref 39.0–52.0)
Hemoglobin: 12.9 g/dL — ABNORMAL LOW (ref 13.0–17.0)
MCH: 31.5 pg (ref 26.0–34.0)
MCHC: 33.4 g/dL (ref 30.0–36.0)
MCV: 94.4 fL (ref 78.0–100.0)
Platelets: 206 10*3/uL (ref 150–400)
RBC: 4.09 MIL/uL — ABNORMAL LOW (ref 4.22–5.81)
RDW: 12.7 % (ref 11.5–15.5)
WBC: 7.3 10*3/uL (ref 4.0–10.5)

## 2015-08-05 LAB — ABO/RH: ABO/RH(D): O POS

## 2015-08-05 NOTE — Patient Instructions (Addendum)
Andrew Williamson  08/05/2015   Your procedure is scheduled on: Sept. 1, 2016  Report to [facility] Main  Entrance take [facility]  elevators to 3rd floor to  [place] at  5:15 AM.  Call this number if you have problems the morning of surgery [phone]   Remember: ONLY 1 PERSON MAY GO WITH YOU TO SHORT STAY TO GET  READY MORNING OF [facility].  Do not eat food after midnight [place] night.  Wednesday morning follow clear liquid diet.  Then nothing by mouth after midnight Wednesday night.   CLEAR LIQUID DIET   Foods Allowed                                                                     Foods Excluded  Coffee and tea, regular and decaf                             liquids that you cannot  Plain Jell-O in any flavor                                             see through such as: Fruit ices (not with fruit pulp)                                     milk, soups, orange juice  Iced Popsicles                                    All solid food Carbonated beverages, regular and diet                                    Cranberry, grape and apple juices Sports drinks like Gatorade Lightly seasoned clear broth or consume(fat free) Sugar, honey syrup  Sample Menu Breakfast                                Lunch                                     Supper Cranberry juice                    Beef broth                            Chicken broth Jell-O                                     Grape juice  Apple juice Coffee or tea                        Jell-O                                      Popsicle                                                Coffee or tea                        Coffee or tea  _____________________________________________________________________      Take these medicines the morning of surgery with A SIP OF WATER:  Prilosec WHAT IS A BLOOD TRANSFUSION? Blood Transfusion Information  A transfusion is the replacement of  blood or some of its parts. Blood is made up of multiple cells which provide different functions.  Red blood cells carry oxygen and are used for blood loss replacement.  White blood cells fight against infection.  Platelets control bleeding.  Plasma helps clot blood.  Other blood products are available for specialized needs, such as hemophilia or other clotting disorders. BEFORE THE TRANSFUSION  Who gives blood for transfusions?   Healthy volunteers who are fully evaluated to make sure their blood is safe. This is blood bank blood. Transfusion therapy is the safest it has ever been in the practice of medicine. Before blood is taken from a donor, a complete history is taken to make sure that person has no history of diseases nor engages in risky social behavior (examples are intravenous drug use or sexual activity with multiple partners). The donor's travel history is screened to minimize risk of transmitting infections, such as malaria. The donated blood is tested for signs of infectious diseases, such as HIV and hepatitis. The blood is then tested to be sure it is compatible with you in order to minimize the chance of a transfusion reaction. If you or a relative donates blood, this is often done in anticipation of surgery and is not appropriate for emergency situations. It takes many days to process the donated blood. RISKS AND COMPLICATIONS Although transfusion therapy is very safe and saves many lives, the main dangers of transfusion include:   Getting an infectious disease.  Developing a transfusion reaction. This is an allergic reaction to something in the blood you were given. Every precaution is taken to prevent this. The decision to have a blood transfusion has been considered carefully by your caregiver before blood is given. Blood is not given unless the benefits outweigh the risks. AFTER THE TRANSFUSION  Right after receiving a blood transfusion, you will usually feel much better  and more energetic. This is especially true if your red blood cells have gotten low (anemic). The transfusion raises the level of the red blood cells which carry oxygen, and this usually causes an energy increase.  The nurse administering the transfusion will monitor you carefully for complications. HOME CARE INSTRUCTIONS  No special instructions are needed after a transfusion. You may find your energy is better. Speak with your caregiver about any limitations on activity for underlying diseases you may have. SEEK MEDICAL CARE IF:   Your condition is not improving after your transfusion.  You develop redness or irritation at the intravenous (IV) site. SEEK IMMEDIATE MEDICAL CARE IF:  Any of the following symptoms occur over the next 12 hours:  Shaking chills.  You have a temperature by mouth above 102 F (38.9 C), not controlled by medicine.  Chest, back, or muscle pain.  People around you feel you are not acting correctly or are confused.  Shortness of breath or difficulty breathing.  Dizziness and fainting.  You get a rash or develop hives.  You have a decrease in urine output.  Your urine turns a dark color or changes to pink, red, or brown. Any of the following symptoms occur over the next 10 days:  You have a temperature by mouth above 102 F (38.9 C), not controlled by medicine.  Shortness of breath.  Weakness after normal activity.  The white part of the eye turns yellow (jaundice).  You have a decrease in the amount of urine or are urinating less often.  Your urine turns a dark color or changes to pink, red, or brown. Document Released: 11/23/2000 Document Revised: 02/18/2012 Document Reviewed: 07/12/2008 ExitCare Patient Information 2014 ExitCare, [place].  _______________________________________________________________________                              Dennis Bast may not have any metal on your body including hair pins and              piercings  Do not wear  jewelry,  lotions, powders or perfumes, deodorant                      Men may shave face and neck.   Do not bring valuables to the hospital. [place].  Contacts, dentures or bridgework may not be worn into surgery.  Leave suitcase in the car. After surgery it may be brought to your room.  :  Special Instructions: coughing and deep breathing exercises, leg exercises              Please read over the following fact sheets you were given: _____________________________________________________________________             [facility] - Preparing for Surgery Before surgery, you can play an important role.  Because skin is not sterile, your skin needs to be as free of germs as possible.  You can reduce the number of germs on your skin by washing with CHG (chlorahexidine gluconate) soap before surgery.  CHG is an antiseptic cleaner which kills germs and bonds with the skin to continue killing germs even after washing. Please DO NOT use if you have an allergy to CHG or antibacterial soaps.  If your skin becomes reddened/irritated stop using the CHG and inform your nurse when you arrive at Short Stay. Do not shave (including legs and underarms) for at least 48 hours prior to the first CHG shower.  You may shave your face/neck. Please follow these instructions carefully:  1.  Shower with CHG Soap the night before surgery and the  morning of Surgery.  2.  If you choose to wash your hair, wash your hair first as usual with your  normal  shampoo.  3.  After you shampoo, rinse your hair and body thoroughly to remove the  shampoo.  4.  Use CHG as you would any other liquid soap.  You can apply chg directly  to the skin and wash                       Gently with a scrungie or clean washcloth.  5.  Apply the CHG Soap to your body ONLY FROM THE NECK DOWN.   Do not use on face/ open                           Wound or open sores. Avoid  contact with eyes, ears mouth and genitals (private parts).                       Wash face,  Genitals (private parts) with your normal soap.             6.  Wash thoroughly, paying special attention to the area where your surgery  will be performed.  7.  Thoroughly rinse your body with warm water from the neck down.  8.  DO NOT shower/wash with your normal soap after using and rinsing off  the CHG Soap.                9.  Pat yourself dry with a clean towel.            10.  Wear clean pajamas.            11.  Place clean sheets on your bed the night of your first shower and do not  sleep with pets. Day of Surgery : Do not apply any lotions/deodorants the morning of surgery.  Please wear clean clothes to the hospital/surgery center.  FAILURE TO FOLLOW THESE INSTRUCTIONS MAY RESULT IN THE CANCELLATION OF YOUR SURGERY PATIENT SIGNATURE_________________________________  NURSE SIGNATURE__________________________________  ________________________________________________________________________

## 2015-08-05 NOTE — Progress Notes (Signed)
   08/05/15 1311  OBSTRUCTIVE SLEEP APNEA  Have you ever been diagnosed with sleep apnea through a sleep study? No  Do you snore loudly (loud enough to be heard through closed doors)?  1  Do you often feel tired, fatigued, or sleepy during the daytime? 0  Has anyone observed you stop breathing during your sleep? 0  Do you have, or are you being treated for high blood pressure? 1  Age over 69 years old? 1  Neck circumference greater than 40 cm/16 inches? 0  Gender: 1  Obstructive Sleep Apnea Score 4

## 2015-08-08 NOTE — Progress Notes (Signed)
2012 - EKG at [facility]

## 2015-08-10 NOTE — H&P (Signed)
Reason For Visit Seen today for a preop visit.   Active Problems Problems  1. Preop examination (H37.169)   Assessed By: Jimmey Ralph (Urology); Last Assessed: 27 Jul 2015 2. Prostate cancer (C61)   Assessed By: Jimmey Ralph (Urology); Last Assessed: 27 Jul 2015  History of Present Illness 69 YO male patient of Dr. Ralene Muskrat seen today for preop visit. Scheduled for RRR LAP 08/11/15.    GU hx:  S/P prostate biopsy done for a PSA of 3.9 with a rising level over the last few years.  He has a T1c Nx Mx Gleason 6 tumor in 6/12 cores. His Prostate volume is 73ml. He has an IPSS of 17 and a SHIM that is low at 5 He is able to get an erection but it is not sufficient for penetration. The CAPRA score is 2.      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     Interval Hx:   Today denies f/c, cough/congestion, or CP.   Past Medical History Problems  1. History of arthritis (Z87.39) 2. History of diabetes mellitus (Z86.39) 3. History of esophageal reflux (Z87.19) 4. History of hypercholesterolemia (Z86.39) 5. History of hypertension (Z86.79)  Surgical History Problems  1. History of Cholecystectomy  Current Meds 1. Aspirin 81 MG TABS;  Therapy: (Recorded:21Mar2016) to Recorded 2. Krill Oil 300 MG Oral Capsule;  Therapy: (Recorded:18Aug2016) to Recorded 3. Lisinopril 10 MG Oral Tablet;  Therapy: (Recorded:21Mar2016) to Recorded 4. MetFORMIN HCl - 500 MG Oral Tablet;  Therapy: (Recorded:21Mar2016) to Recorded 5. Multi-Vitamin Oral Tablet;  Therapy: (Recorded:21Mar2016) to Recorded 6. Naproxen TABS;  Therapy: (Recorded:18Aug2016) to Recorded 7. Noritate 1 % External Cream;  Therapy: (Recorded:18Aug2016) to Recorded 8. Omeprazole 40 MG Oral Capsule Delayed Release;  Therapy: (Recorded:21Mar2016) to Recorded 9. Pravastatin Sodium 20 MG Oral Tablet;  Therapy: (Recorded:21Mar2016) to Recorded  Allergies Medication  1. Thimerosal  Family History Problems  1. Family  history of congestive heart failure (Z82.49) : Father 2. Family history of kidney stones (Z84.1) : Mother 3. Family history of myocardial infarction (Z82.49) : Mother  Social History Problems  1. Chews tobacco (Z72.0) 2. Daily caffeine consumption, 4-5 servings a day 3. Father deceased 27. Minimum alcohol consumption 5. Mother deceased 60. Never a smoker 7. Retired 46. Widower  Review of Systems Genitourinary, constitutional, skin, eye, otolaryngeal, hematologic/lymphatic, cardiovascular, pulmonary, endocrine, musculoskeletal, gastrointestinal, neurological and psychiatric system(s) were reviewed and pertinent findings if present are noted and are otherwise negative.    Vitals Vital Signs [Data Includes: Last 1 Day]  Recorded: 18Aug2016 09:42AM  Blood Pressure: 136 / 67 Temperature: 97.9 F Heart Rate: 78  Physical Exam Constitutional: Well nourished and well developed . No acute distress. The patient appears well hydrated.  ENT:. The ears and nose are normal in appearance.  Neck: The appearance of the neck is normal.  Pulmonary: No respiratory distress.  Cardiovascular: Heart rate and rhythm are normal.  Abdomen: The abdomen is rounded. The abdomen is soft and nontender. No suprapubic tenderness.  Skin: Normal skin turgor.  Neuro/Psych:. Mood and affect are appropriate.    Results/Data Urine [Data Includes: Last 1 Day]   [id number]  COLOR YELLOW   APPEARANCE CLEAR   SPECIFIC GRAVITY 1.025   pH 5.5   GLUCOSE NEGATIVE   BILIRUBIN NEGATIVE   KETONE NEGATIVE   BLOOD NEGATIVE   PROTEIN NEGATIVE   NITRITE NEGATIVE   LEUKOCYTE ESTERASE NEGATIVE    The following clinical lab reports were reviewed:  UA: negative.  Assessment Assessed  1. Prostate cancer (C61) 2. Preop examination (Z01.818)  Plan Health Maintenance  1. UA With REFLEX; [Do Not Release]; Status:Complete;   Done: [id number] 09:35AM  Cleared to proceed with upcoming RRR LAP with Dr. Gaspar Bidding Electronically signed by : Jimmey Ralph, ANP-C; Jul 28 2015  9:53AM EST

## 2015-08-11 ENCOUNTER — Encounter (HOSPITAL_COMMUNITY): Payer: Self-pay | Admitting: *Deleted

## 2015-08-11 ENCOUNTER — Inpatient Hospital Stay (HOSPITAL_COMMUNITY): Payer: Medicare Other | Admitting: Registered Nurse

## 2015-08-11 ENCOUNTER — Encounter (HOSPITAL_COMMUNITY)
Admission: RE | Disposition: A | Discharge status: Home / Self Care | Payer: Self-pay | Source: Ambulatory Visit | Attending: Urology

## 2015-08-11 ENCOUNTER — Inpatient Hospital Stay (HOSPITAL_COMMUNITY)
Admission: RE | Admit: 2015-08-11 | Discharge: 2015-08-12 | DRG: 708 | Disposition: A | Discharge status: Home / Self Care | Payer: Medicare Other | Source: Ambulatory Visit | Attending: Urology | Admitting: Urology

## 2015-08-11 DIAGNOSIS — M1990 Unspecified osteoarthritis, unspecified site: Secondary | ICD-10-CM | POA: Diagnosis present

## 2015-08-11 DIAGNOSIS — F17220 Nicotine dependence, chewing tobacco, uncomplicated: Secondary | ICD-10-CM | POA: Diagnosis present

## 2015-08-11 DIAGNOSIS — K219 Gastro-esophageal reflux disease without esophagitis: Secondary | ICD-10-CM | POA: Diagnosis present

## 2015-08-11 DIAGNOSIS — E119 Type 2 diabetes mellitus without complications: Secondary | ICD-10-CM | POA: Diagnosis present

## 2015-08-11 DIAGNOSIS — C61 Malignant neoplasm of prostate: Principal | ICD-10-CM | POA: Diagnosis present

## 2015-08-11 DIAGNOSIS — Z79899 Other long term (current) drug therapy: Secondary | ICD-10-CM | POA: Diagnosis not present

## 2015-08-11 DIAGNOSIS — Z7982 Long term (current) use of aspirin: Secondary | ICD-10-CM | POA: Diagnosis not present

## 2015-08-11 DIAGNOSIS — I10 Essential (primary) hypertension: Secondary | ICD-10-CM | POA: Diagnosis present

## 2015-08-11 DIAGNOSIS — E291 Testicular hypofunction: Secondary | ICD-10-CM | POA: Diagnosis present

## 2015-08-11 HISTORY — PX: ROBOT ASSISTED LAPAROSCOPIC RADICAL PROSTATECTOMY: SHX5141

## 2015-08-11 LAB — HEMOGLOBIN AND HEMATOCRIT, BLOOD
HCT: 34.2 % — ABNORMAL LOW (ref 39.0–52.0)
Hemoglobin: 11.6 g/dL — ABNORMAL LOW (ref 13.0–17.0)

## 2015-08-11 LAB — GLUCOSE, CAPILLARY
GLUCOSE-CAPILLARY: 149 mg/dL — AB (ref 65–99)
GLUCOSE-CAPILLARY: 138 mg/dL — AB (ref 65–99)
Glucose-Capillary: 124 mg/dL — ABNORMAL HIGH (ref 65–99)
Glucose-Capillary: 124 mg/dL — ABNORMAL HIGH (ref 65–99)
Glucose-Capillary: 140 mg/dL — ABNORMAL HIGH (ref 65–99)

## 2015-08-11 LAB — TYPE AND SCREEN
ABO/RH(D): O POS
ANTIBODY SCREEN: NEGATIVE

## 2015-08-11 SURGERY — ROBOTIC ASSISTED LAPAROSCOPIC RADICAL PROSTATECTOMY
Anesthesia: General

## 2015-08-11 MED ORDER — EPHEDRINE SULFATE 50 MG/ML IJ SOLN
INTRAMUSCULAR | Status: AC
  Filled 2015-08-11: qty 1

## 2015-08-11 MED ORDER — CEFAZOLIN SODIUM-DEXTROSE 2-3 GM-% IV SOLR
2.0000 g | INTRAVENOUS | Status: AC
  Administered 2015-08-11: 2 g via INTRAVENOUS

## 2015-08-11 MED ORDER — HEPARIN SODIUM (PORCINE) 1000 UNIT/ML IJ SOLN
INTRAMUSCULAR | Status: AC
  Filled 2015-08-11: qty 1

## 2015-08-11 MED ORDER — METFORMIN HCL 500 MG PO TABS
1000.0000 mg | ORAL_TABLET | Freq: Two times a day (BID) | ORAL | Status: DC
  Administered 2015-08-11 – 2015-08-12 (×2): 1000 mg via ORAL
  Filled 2015-08-11 (×3): qty 2

## 2015-08-11 MED ORDER — BUPIVACAINE-EPINEPHRINE 0.25% -1:200000 IJ SOLN
INTRAMUSCULAR | Status: DC | PRN
  Administered 2015-08-11: 30 mL

## 2015-08-11 MED ORDER — ROCURONIUM BROMIDE 100 MG/10ML IV SOLN
INTRAVENOUS | Status: AC
  Filled 2015-08-11: qty 1

## 2015-08-11 MED ORDER — NEOSTIGMINE METHYLSULFATE 10 MG/10ML IV SOLN
INTRAVENOUS | Status: AC
  Filled 2015-08-11: qty 1

## 2015-08-11 MED ORDER — LIDOCAINE HCL (CARDIAC) 20 MG/ML IV SOLN
INTRAVENOUS | Status: AC
  Filled 2015-08-11: qty 5

## 2015-08-11 MED ORDER — HYDROMORPHONE HCL 1 MG/ML IJ SOLN
INTRAMUSCULAR | Status: DC | PRN
  Administered 2015-08-11 (×4): 0.5 mg via INTRAVENOUS

## 2015-08-11 MED ORDER — ROCURONIUM BROMIDE 100 MG/10ML IV SOLN
INTRAVENOUS | Status: DC | PRN
  Administered 2015-08-11: 30 mg via INTRAVENOUS
  Administered 2015-08-11: 10 mg via INTRAVENOUS
  Administered 2015-08-11: 20 mg via INTRAVENOUS

## 2015-08-11 MED ORDER — HYDROMORPHONE HCL 2 MG/ML IJ SOLN
INTRAMUSCULAR | Status: AC
  Filled 2015-08-11: qty 1

## 2015-08-11 MED ORDER — SODIUM CHLORIDE 0.9 % IR SOLN
Status: DC | PRN
  Administered 2015-08-11: 1000 mL

## 2015-08-11 MED ORDER — SULFAMETHOXAZOLE-TRIMETHOPRIM 800-160 MG PO TABS: 1.0000 | ORAL_TABLET | Freq: Two times a day (BID) | ORAL | Status: DC

## 2015-08-11 MED ORDER — PRAVASTATIN SODIUM 20 MG PO TABS
20.0000 mg | ORAL_TABLET | Freq: Every morning | ORAL | Status: DC
  Administered 2015-08-11 – 2015-08-12 (×2): 20 mg via ORAL
  Filled 2015-08-11 (×2): qty 1

## 2015-08-11 MED ORDER — SODIUM CHLORIDE 0.9 % IJ SOLN
INTRAMUSCULAR | Status: AC
  Filled 2015-08-11: qty 10

## 2015-08-11 MED ORDER — LACTATED RINGERS IR SOLN
Status: DC | PRN
  Administered 2015-08-11: 1000 mL

## 2015-08-11 MED ORDER — MIDAZOLAM HCL 5 MG/5ML IJ SOLN
INTRAMUSCULAR | Status: DC | PRN
  Administered 2015-08-11: 2 mg via INTRAVENOUS

## 2015-08-11 MED ORDER — HYDROMORPHONE HCL 1 MG/ML IJ SOLN
INTRAMUSCULAR | Status: AC
  Filled 2015-08-11: qty 1

## 2015-08-11 MED ORDER — PROPOFOL 10 MG/ML IV BOLUS
INTRAVENOUS | Status: DC | PRN
  Administered 2015-08-11: 170 mg via INTRAVENOUS

## 2015-08-11 MED ORDER — HYDROCODONE-ACETAMINOPHEN 5-325 MG PO TABS
1.0000 | ORAL_TABLET | ORAL | Status: DC | PRN
Start: 2015-08-11 — End: 2016-10-17

## 2015-08-11 MED ORDER — BUPIVACAINE-EPINEPHRINE (PF) 0.25% -1:200000 IJ SOLN
INTRAMUSCULAR | Status: AC
  Filled 2015-08-11: qty 30

## 2015-08-11 MED ORDER — LACTATED RINGERS IV SOLN
INTRAVENOUS | Status: DC | PRN
  Administered 2015-08-11: 07:00:00 via INTRAVENOUS

## 2015-08-11 MED ORDER — ONDANSETRON HCL 4 MG/2ML IJ SOLN
INTRAMUSCULAR | Status: AC
  Filled 2015-08-11: qty 2

## 2015-08-11 MED ORDER — HYDROMORPHONE HCL 1 MG/ML IJ SOLN
0.2500 mg | INTRAMUSCULAR | Status: DC | PRN
  Administered 2015-08-11 (×2): 0.5 mg via INTRAVENOUS

## 2015-08-11 MED ORDER — LIDOCAINE HCL (CARDIAC) 20 MG/ML IV SOLN
INTRAVENOUS | Status: DC | PRN
  Administered 2015-08-11: 100 mg via INTRAVENOUS

## 2015-08-11 MED ORDER — ONDANSETRON HCL 4 MG/2ML IJ SOLN: 4.0000 mg | INTRAMUSCULAR | Status: DC | PRN

## 2015-08-11 MED ORDER — ONDANSETRON HCL 4 MG/2ML IJ SOLN
INTRAMUSCULAR | Status: DC | PRN
Start: 2015-08-11 — End: 2015-08-11
  Administered 2015-08-11: 4 mg via INTRAVENOUS

## 2015-08-11 MED ORDER — CEFAZOLIN SODIUM-DEXTROSE 2-3 GM-% IV SOLR
INTRAVENOUS | Status: AC
  Filled 2015-08-11: qty 50

## 2015-08-11 MED ORDER — GLYCOPYRROLATE 0.2 MG/ML IJ SOLN
INTRAMUSCULAR | Status: DC | PRN
  Administered 2015-08-11: .8 mg via INTRAVENOUS

## 2015-08-11 MED ORDER — LISINOPRIL 10 MG PO TABS
10.0000 mg | ORAL_TABLET | Freq: Every morning | ORAL | Status: DC
  Administered 2015-08-11 – 2015-08-12 (×2): 10 mg via ORAL
  Filled 2015-08-11 (×2): qty 1

## 2015-08-11 MED ORDER — INFLUENZA VAC SPLIT QUAD 0.5 ML IM SUSY
0.5000 mL | PREFILLED_SYRINGE | INTRAMUSCULAR | Status: DC
  Filled 2015-08-11 (×2): qty 0.5

## 2015-08-11 MED ORDER — LACTATED RINGERS IV SOLN: INTRAVENOUS | Status: DC

## 2015-08-11 MED ORDER — HYDROCODONE-ACETAMINOPHEN 5-325 MG PO TABS
1.0000 | ORAL_TABLET | ORAL | Status: DC | PRN
Start: 2015-08-11 — End: 2015-08-11

## 2015-08-11 MED ORDER — PROPOFOL 10 MG/ML IV BOLUS
INTRAVENOUS | Status: AC
  Filled 2015-08-11: qty 20

## 2015-08-11 MED ORDER — NEOSTIGMINE METHYLSULFATE 10 MG/10ML IV SOLN
INTRAVENOUS | Status: DC | PRN
  Administered 2015-08-11: 5 mg via INTRAVENOUS

## 2015-08-11 MED ORDER — EPHEDRINE SULFATE 50 MG/ML IJ SOLN
INTRAMUSCULAR | Status: DC | PRN
  Administered 2015-08-11: 5 mg via INTRAVENOUS

## 2015-08-11 MED ORDER — SUCCINYLCHOLINE CHLORIDE 20 MG/ML IJ SOLN
INTRAMUSCULAR | Status: DC | PRN
  Administered 2015-08-11: 100 mg via INTRAVENOUS

## 2015-08-11 MED ORDER — PANTOPRAZOLE SODIUM 40 MG PO TBEC
80.0000 mg | DELAYED_RELEASE_TABLET | Freq: Every day | ORAL | Status: DC
  Administered 2015-08-12: 80 mg via ORAL
  Filled 2015-08-11: qty 2

## 2015-08-11 MED ORDER — PROMETHAZINE HCL 25 MG/ML IJ SOLN
6.2500 mg | INTRAMUSCULAR | Status: DC | PRN
Start: 2015-08-11 — End: 2015-08-11

## 2015-08-11 MED ORDER — FENTANYL CITRATE (PF) 100 MCG/2ML IJ SOLN
INTRAMUSCULAR | Status: DC | PRN
  Administered 2015-08-11 (×5): 50 ug via INTRAVENOUS

## 2015-08-11 MED ORDER — SODIUM CHLORIDE 0.9 % IV BOLUS (SEPSIS)
1000.0000 mL | Freq: Once | INTRAVENOUS | Status: AC
  Administered 2015-08-11: 1000 mL via INTRAVENOUS

## 2015-08-11 MED ORDER — ACETAMINOPHEN 500 MG PO TABS
1000.0000 mg | ORAL_TABLET | Freq: Four times a day (QID) | ORAL | Status: AC
  Administered 2015-08-11 – 2015-08-12 (×4): 1000 mg via ORAL
  Filled 2015-08-11 (×6): qty 2

## 2015-08-11 MED ORDER — MIDAZOLAM HCL 2 MG/2ML IJ SOLN
INTRAMUSCULAR | Status: AC
  Filled 2015-08-11: qty 4

## 2015-08-11 MED ORDER — INSULIN ASPART 100 UNIT/ML ~~LOC~~ SOLN: 0.0000 [IU] | Freq: Three times a day (TID) | SUBCUTANEOUS | Status: DC

## 2015-08-11 MED ORDER — FENTANYL CITRATE (PF) 250 MCG/5ML IJ SOLN
INTRAMUSCULAR | Status: AC
  Filled 2015-08-11: qty 25

## 2015-08-11 MED ORDER — OXYCODONE HCL 5 MG PO TABS: 5.0000 mg | ORAL_TABLET | ORAL | Status: DC | PRN

## 2015-08-11 MED ORDER — HYDROMORPHONE HCL 1 MG/ML IJ SOLN: 0.5000 mg | INTRAMUSCULAR | Status: DC | PRN

## 2015-08-11 MED ORDER — GLYCOPYRROLATE 0.2 MG/ML IJ SOLN
INTRAMUSCULAR | Status: AC
  Filled 2015-08-11: qty 4

## 2015-08-11 MED ORDER — SODIUM CHLORIDE 0.45 % IV SOLN
INTRAVENOUS | Status: DC
  Administered 2015-08-11 (×2): via INTRAVENOUS

## 2015-08-11 SURGICAL SUPPLY — 50 items
APPLICATOR SURGIFLO ENDO (HEMOSTASIS) ×3 IMPLANT
CABLE HIGH FREQUENCY MONO STRZ (ELECTRODE) ×3 IMPLANT
CATH FOLEY 2WAY SLVR 18FR 30CC (CATHETERS) ×3 IMPLANT
CATH ROBINSON RED A/P 16FR (CATHETERS) ×3 IMPLANT
CATH TIEMANN FOLEY 18FR 5CC (CATHETERS) ×3 IMPLANT
CHLORAPREP W/TINT 26ML (MISCELLANEOUS) ×3 IMPLANT
CLOTH BEACON ORANGE TIMEOUT ST (SAFETY) ×3 IMPLANT
COVER SURGICAL LIGHT HANDLE (MISCELLANEOUS) ×3 IMPLANT
COVER TIP SHEARS 8 DVNC (MISCELLANEOUS) ×1 IMPLANT
COVER TIP SHEARS 8MM DA VINCI (MISCELLANEOUS) ×2
CUTTER ECHEON FLEX ENDO 45 340 (ENDOMECHANICALS) ×3 IMPLANT
DECANTER SPIKE VIAL GLASS SM (MISCELLANEOUS) ×3 IMPLANT
DRAPE SURG IRRIG POUCH 19X23 (DRAPES) ×3 IMPLANT
DRSG TEGADERM 2-3/8X2-3/4 SM (GAUZE/BANDAGES/DRESSINGS) IMPLANT
DRSG TEGADERM 4X4.75 (GAUZE/BANDAGES/DRESSINGS) ×3 IMPLANT
DRSG TEGADERM 6X8 (GAUZE/BANDAGES/DRESSINGS) IMPLANT
ELECT REM PT RETURN 9FT ADLT (ELECTROSURGICAL) ×3
ELECTRODE REM PT RTRN 9FT ADLT (ELECTROSURGICAL) ×1 IMPLANT
GLOVE BIO SURGEON STRL SZ 6.5 (GLOVE) ×2 IMPLANT
GLOVE BIO SURGEONS STRL SZ 6.5 (GLOVE) ×1
GLOVE SURG SS PI 8.0 STRL IVOR (GLOVE) ×6 IMPLANT
GOWN STRL REUS W/TWL LRG LVL3 (GOWN DISPOSABLE) ×6 IMPLANT
GOWN STRL REUS W/TWL XL LVL3 (GOWN DISPOSABLE) ×6 IMPLANT
HOLDER FOLEY CATH W/STRAP (MISCELLANEOUS) ×3 IMPLANT
IV LACTATED RINGERS 1000ML (IV SOLUTION) ×3 IMPLANT
KIT ACCESSORY DA VINCI DISP (KITS) ×2
KIT ACCESSORY DVNC DISP (KITS) ×1 IMPLANT
LIQUID BAND (GAUZE/BANDAGES/DRESSINGS) ×3 IMPLANT
MANIFOLD NEPTUNE II (INSTRUMENTS) ×3 IMPLANT
NDL SAFETY ECLIPSE 18X1.5 (NEEDLE) ×1 IMPLANT
NEEDLE HYPO 18GX1.5 SHARP (NEEDLE) ×2
PACK ROBOT UROLOGY CUSTOM (CUSTOM PROCEDURE TRAY) ×3 IMPLANT
PAD POSITIONING PINK XL (MISCELLANEOUS) ×3 IMPLANT
RELOAD GREEN ECHELON 45 (STAPLE) ×3 IMPLANT
SEALER TISSUE G2 CVD JAW 45CM (ENDOMECHANICALS) ×3 IMPLANT
SET TUBE IRRIG SUCTION NO TIP (IRRIGATION / IRRIGATOR) ×3 IMPLANT
SHEET LAVH (DRAPES) ×3 IMPLANT
SOLUTION ELECTROLUBE (MISCELLANEOUS) ×3 IMPLANT
SURGIFLO W/THROMBIN 8M KIT (HEMOSTASIS) ×3 IMPLANT
SUT MNCRL AB 4-0 PS2 18 (SUTURE) ×6 IMPLANT
SUT VIC AB 0 CT1 36 (SUTURE) ×3 IMPLANT
SUT VIC AB 2-0 SH 27 (SUTURE) ×2
SUT VIC AB 2-0 SH 27X BRD (SUTURE) ×1 IMPLANT
SUT VICRYL 0 UR6 27IN ABS (SUTURE) ×6 IMPLANT
SUT VLOC BARB 180 ABS3/0GR12 (SUTURE) ×6
SUTURE VLOC BRB 180 ABS3/0GR12 (SUTURE) ×2 IMPLANT
SYR 27GX1/2 1ML LL SAFETY (SYRINGE) ×3 IMPLANT
TAPE CLOTH 4X10 WHT NS (GAUZE/BANDAGES/DRESSINGS) ×3 IMPLANT
TOWEL OR NON WOVEN STRL DISP B (DISPOSABLE) ×6 IMPLANT
WATER STERILE IRR 1500ML POUR (IV SOLUTION) ×3 IMPLANT

## 2015-08-11 NOTE — Interval H&P Note (Signed)
History and Physical Interval Note:    08/11/2015 7:03 AM  Andrew Williamson  has presented today for surgery, with the diagnosis of PROSTATE CANCER   The various methods of treatment have been discussed with the patient and family. After consideration of risks, benefits and other options for treatment, the patient has consented to  Procedure(s): ROBOTIC [place] (N/A) as a surgical intervention .  The patient's history has been reviewed, patient examined, no change in status, stable for surgery.  I have reviewed the patient's chart and labs.  Questions were answered to the patient's satisfaction.     Vivia Rosenburg J

## 2015-08-11 NOTE — Discharge Instructions (Signed)

## 2015-08-11 NOTE — Progress Notes (Signed)
Utilization review completed.  

## 2015-08-11 NOTE — Progress Notes (Signed)
Patient ID: Andrew Williamson, male   DOB: May 02, 1946, 70 y.o.   MRN: [id number]   He is doing well post op.   He had some dizziness and nausea but that has resolved.  Hgb 11.9.   Good UOP with clearing urine.  BP 161/74 mmHg  Pulse 84  Temp(Src) 97.6 F (36.4 C) (Oral)  Resp 15  Ht 5\' 9"  (1.753 m)  Wt 94.802 kg (209 lb)  BMI 30.85 kg/m2  SpO2 98%   Imp: Doing well post RALP.  Plan:  Anticipate drain removal and discharge tomorrow.

## 2015-08-11 NOTE — Transfer of Care (Signed)
Immediate Anesthesia Transfer of Care Note  Patient: Andrew Williamson  Procedure(s) Performed: Procedure(s): ROBOTIC ASSISTED LAPAROSCOPIC RADICAL PROSTATECTOMY (N/A)  Patient Location: PACU  Anesthesia Type:General  Level of Consciousness: awake, alert , oriented and patient cooperative  Airway & Oxygen Therapy: Patient Spontanous Breathing and Patient connected to face mask oxygen  Post-op Assessment: Report given to RN, Post -op Vital signs reviewed and stable and Patient moving all extremities  Post vital signs: Reviewed and stable  Last Vitals: There were no vitals filed for this visit.  Complications: No apparent anesthesia complications

## 2015-08-11 NOTE — Discharge Summary (Signed)
Date of admission: 08/11/2015  Date of discharge: 08/12/2015  Admission diagnosis: Prostate Cancer  Discharge diagnosis: Prostate Cancer  History and Physical: For full details, please see admission history and physical. Briefly, Andrew Williamson is a 69 y.o. gentleman with localized prostate cancer.  After discussing management/treatment options, he elected to proceed with surgical treatment.  Hospital Course: Andrew Williamson was taken to the operating room on 08/11/2015 and underwent a robotic assisted laparoscopic radical prostatectomy. He tolerated this procedure well and without complications. Postoperatively, he was able to be transferred to a regular hospital room following recovery from anesthesia.  He was able to begin ambulating the night of surgery. He remained hemodynamically stable overnight.  He had excellent urine output with appropriately minimal output from his pelvic drain and his pelvic drain was removed on POD #1.  He was transitioned to oral pain medication, tolerated a clear liquid diet, and had met all discharge criteria and was able to be discharged home later on POD#1.  Laboratory values:  Recent Labs  08/11/15 1048  HGB 11.6*  HCT 34.2*    Disposition: Home  Discharge instruction: He was instructed to be ambulatory but to refrain from heavy lifting, strenuous activity, or driving. He was instructed on urethral catheter care.  Discharge medications:     Medication List    STOP taking these medications        aspirin EC 81 MG tablet     KRILL OIL PO     multivitamin with minerals Tabs tablet     naproxen sodium 220 MG tablet  Commonly known as:  ANAPROX      TAKE these medications        HYDROcodone-acetaminophen 5-325 MG per tablet  Commonly known as:  NORCO/VICODIN  Take 1-2 tablets by mouth every 4 (four) hours as needed for moderate pain.     lisinopril 10 MG tablet  Commonly known as:  PRINIVIL,ZESTRIL  Take 10 mg by mouth every morning.     metFORMIN 500 MG tablet  Commonly known as:  GLUCOPHAGE  Take 2 tablets by mouth 2 (two) times daily.     metronidazole 1 % cream  Commonly known as:  NORITATE  Apply 1 application topically daily as needed (rosacea flare up.).     omeprazole 40 MG capsule  Commonly known as:  PRILOSEC  Take 1 capsule by mouth every morning.     pravastatin 20 MG tablet  Commonly known as:  PRAVACHOL  Take 20 mg by mouth every morning.     sulfamethoxazole-trimethoprim 800-160 MG per tablet  Commonly known as:  BACTRIM DS,SEPTRA DS  Take 1 tablet by mouth 2 (two) times daily. Start the day prior to foley removal appointment        Followup: He will followup in 1 week for catheter removal and to discuss his surgical pathology results.

## 2015-08-11 NOTE — Anesthesia Postprocedure Evaluation (Signed)
  Anesthesia Post-op Note  Patient: Andrew Williamson  Procedure(s) Performed: Procedure(s) (LRB): ROBOTIC ASSISTED LAPAROSCOPIC RADICAL PROSTATECTOMY (N/A)  Patient Location: PACU  Anesthesia Type: General  Level of Consciousness: awake and alert   Airway and Oxygen Therapy: Patient Spontanous Breathing  Post-op Pain: mild  Post-op Assessment: Post-op Vital signs reviewed, Patient's Cardiovascular Status Stable, Respiratory Function Stable, Patent Airway and No signs of Nausea or vomiting  Last Vitals:  Filed Vitals:   08/11/15 1032  BP: 163/74  Pulse: 95  Temp: 36.4 C  Resp: 14    Post-op Vital Signs: stable   Complications: No apparent anesthesia complications

## 2015-08-11 NOTE — Anesthesia Procedure Notes (Signed)
Procedure Name: Intubation Date/Time: 08/11/2015 7:28 AM Performed by: Carleene Cooper A Pre-anesthesia Checklist: Patient identified, Emergency Drugs available, Suction available, Patient being monitored and Timeout performed Patient Re-evaluated:Patient Re-evaluated prior to inductionOxygen Delivery Method: Circle system utilized Preoxygenation: Pre-oxygenation with 100% oxygen Intubation Type: IV induction Ventilation: Mask ventilation without difficulty Laryngoscope Size: Glidescope and 4 Grade View: Grade I Tube type: Glide rite Tube size: 7.5 mm Number of attempts: 1 Airway Equipment and Method: Video-laryngoscopy Placement Confirmation: ETT inserted through vocal cords under direct vision,  positive ETCO2 and breath sounds checked- equal and bilateral Secured at: 22 cm Tube secured with: Tape Dental Injury: Teeth and Oropharynx as per pre-operative assessment  Comments: Elective Glidescope intubation due to the presence of an anterior airway.

## 2015-08-11 NOTE — Anesthesia Preprocedure Evaluation (Signed)
Anesthesia Evaluation  Patient identified by MRN, date of birth, ID band Patient awake    Reviewed: Allergy & Precautions, NPO status , Patient's Chart, lab work & pertinent test results  Airway Mallampati: II  TM Distance: >3 FB Neck ROM: Full    Dental no notable dental hx.    Pulmonary neg pulmonary ROS,  breath sounds clear to auscultation  Pulmonary exam normal       Cardiovascular hypertension, Normal cardiovascular examRhythm:Regular Rate:Normal     Neuro/Psych negative neurological ROS  negative psych ROS   GI/Hepatic Neg liver ROS, GERD-  Medicated,  Endo/Other  diabetes  Renal/GU negative Renal ROS  negative genitourinary   Musculoskeletal negative musculoskeletal ROS (+)   Abdominal   Peds negative pediatric ROS (+)  Hematology negative hematology ROS (+)   Anesthesia Other Findings   Reproductive/Obstetrics negative OB ROS                             Anesthesia Physical Anesthesia Plan  ASA: II  Anesthesia Plan: General   Post-op Pain Management:    Induction: Intravenous  Airway Management Planned: Oral ETT  Additional Equipment:   Intra-op Plan:   Post-operative Plan: Extubation in OR  Informed Consent: I have reviewed the patients History and Physical, chart, labs and discussed the procedure including the risks, benefits and alternatives for the proposed anesthesia with the patient or authorized representative who has indicated his/her understanding and acceptance.   Dental advisory given  Plan Discussed with: CRNA and Surgeon  Anesthesia Plan Comments:         Anesthesia Quick Evaluation

## 2015-08-11 NOTE — Op Note (Addendum)
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 Robotic assisted laparoscopic radical prostatectomy bilateral nerve sparing  Surgeon: Irine Seal M.D.  Assistant: Debbrah Alar PA   Resident assistant:  Jearld Adjutant MD   Anesthesia: General  Complications: None  EBL: 100 mL   Specimens: 1. Prostate and seminal vesicles  Disposition of specimens: Pathology  Drains: 1. 20 Fr coude catheter 2. # 19 Blake pelvic drain  Indication: Andrew Williamson is a 69 y.o. year old patient with clinically localized prostate cancer.  After a thorough review of the management options for treatment of prostate cancer, he elected to proceed with surgical therapy and the above procedure(s).  We have discussed the potential benefits and risks of the procedure, side effects of the proposed treatment, the likelihood of the patient achieving the goals of the procedure, and any potential problems that might occur during the procedure or recuperation. Informed consent has been obtained.  Description of procedure:  The patient was taken to the operating room and a general anesthetic was administered. He was given preoperative antibiotics, placed in the dorsal lithotomy position.  PAS hose were placed and a red rubber rectal catheter was placed, secured with tape and attached to an asepto syringe.  He was then prepped with chloroprep on the abdomen and betadine on the genitalia and then draped in the usual sterile fashion . Next a preoperative timeout was performed.   A urethral catheter was placed into the bladder and a site was selected near the umbilicus for placement of the camera port. This was placed using a standard open Hassan technique which allowed entry into the peritoneal cavity under direct vision and without  difficulty. A 12 mm port was placed and a pneumoperitoneum established. The camera was then used to inspect the abdomen and there was no evidence of any intra-abdominal injuries or other abnormalities. The remaining abdominal ports were then placed. 8 mm robotic ports were placed in the right lower quadrant, left lower quadrant, and far left lateral abdominal wall. A 5 mm port was placed in the right upper quadrant and a 12 mm port was placed in the right lateral abdominal wall for laparoscopic assistance. All ports were placed under direct vision without difficulty. The surgical cart was then docked.   Utilizing the cautery scissors, the bladder was reflected posteriorly allowing entry into the space of Retzius and identification of the endopelvic fascia and prostate. The periprostatic fat was then removed from the prostate allowing full exposure of the endopelvic fascia. The endopelvic fascia was then incised from the apex back to the base of the prostate bilaterally and the underlying levator muscle fibers were swept laterally off the prostate thereby isolating the dorsal venous complex. The dorsal vein was then stapled and divided with a 45 mm Flex Echelon stapler. Attention then turned to the bladder neck which was divided anteriorly thereby allowing entry into the bladder and exposure of the urethral catheter. The catheter balloon was deflated and the catheter was brought into the operative field and used to retract the prostate anteriorly. The posterior bladder neck was then examined and was divided allowing further dissection between the bladder and prostate posteriorly until the vasa deferentia and seminal vessels were identified. The vasa deferentia were isolated, divided, and lifted anteriorly. The seminal vesicles were dissected down to their tips with care to control the seminal vascular  arterial blood supply. These structures were then lifted anteriorly and the space between Denonvillier's fascia and  the anterior rectum was developed with a combination of sharp and blunt dissection. This isolated the vascular pedicles of the prostate.  The lateral prostatic fascia was then sharply incised allowing release of the neurovascular bundles bilaterally. The vascular pedicles of the prostate were then ligated with the Enseal  between the prostate and neurovascular bundles and divided with sharp cold scissor dissection resulting in bilateral neurovascular bundle preservation. The neurovascular bundles were then separated off the apex of the prostate and urethra bilaterally.  The urethra was then sharply transected allowing the prostate specimen to be disarticulated. The pelvis was copiously irrigated and hemostasis was ensured. There was no evidence for rectal injury.  Attention then turned to the urethral anastomosis. A 2-0 Vicryl slip knot was placed between Denonvillier's fascia, the posterior bladder neck, and the posterior urethra to reapproximate these structures. A double-armed 3-0 Monocryl suture was then used to perform a 360 running tension-free anastomosis between the bladder neck and urethra.   A new urethral catheter was then placed into the bladder and irrigated. There were no blood clots within the bladder and the anastomosis appeared to be watertight. A #19 Blake drain was then brought through the left lateral 8 mm port site and positioned appropriately within the pelvis. It was secured to the skin with a nylon suture. The surgical cart was then undocked. The right lateral 12 mm port site was closed at the fascial level with a 0 Vicryl suture placed laparoscopically. All remaining ports were then removed under direct vision.  The prostate specimen was removed intact within the Endopouch retrieval bag via the periumbilical camera port site. This fascial opening was closed with running 0 Vicryl sutures. 0.25% Marcaine was then injected into all port sites and all incisions were reapproximated at  the skin level with subculicular 4-0 monocryl with 2-0 vicryl deep subcutaneous layer in the midline wound and Dermabond. The patient appeared to tolerate the procedure well and without complications. The patient was able to be extubated and transferred to the recovery unit in satisfactory condition.

## 2015-08-12 ENCOUNTER — Encounter (HOSPITAL_COMMUNITY): Payer: Self-pay | Admitting: Urology

## 2015-08-12 LAB — HEMOGLOBIN AND HEMATOCRIT, BLOOD
HEMATOCRIT: 32.4 % — AB (ref 39.0–52.0)
HEMOGLOBIN: 11.1 g/dL — AB (ref 13.0–17.0)

## 2015-08-12 LAB — BASIC METABOLIC PANEL
ANION GAP: 6 (ref 5–15)
BUN: 10 mg/dL (ref 6–20)
CALCIUM: 8.5 mg/dL — AB (ref 8.9–10.3)
CO2: 28 mmol/L (ref 22–32)
Chloride: 110 mmol/L (ref 101–111)
Creatinine, Ser: 0.98 mg/dL (ref 0.61–1.24)
GLUCOSE: 115 mg/dL — AB (ref 65–99)
POTASSIUM: 3.8 mmol/L (ref 3.5–5.1)
Sodium: 144 mmol/L (ref 135–145)

## 2015-08-12 LAB — CREATININE, FLUID (PLEURAL, PERITONEAL, JP DRAINAGE): CREAT FL: 0.9 mg/dL

## 2015-08-12 LAB — GLUCOSE, CAPILLARY
Glucose-Capillary: 117 mg/dL — ABNORMAL HIGH (ref 65–99)
Glucose-Capillary: 118 mg/dL — ABNORMAL HIGH (ref 65–99)

## 2015-08-12 MED ORDER — LIP MEDEX EX OINT
TOPICAL_OINTMENT | CUTANEOUS | Status: DC | PRN
  Filled 2015-08-12: qty 7

## 2015-08-12 NOTE — Progress Notes (Signed)
1 Day Post-Op Subjective: Patient reports feeling fine. Hungry  Objective: Vital signs in last 24 hours: Temp:  [97.4 F (36.3 C)-98.6 F (37 C)] 98.6 F (37 C) (09/02 0458) Pulse Rate:  [84-95] 91 (09/02 0458) Resp:  [14-20] 20 (09/02 0458) BP: (114-163)/(45-74) 119/45 mmHg (09/02 0458) SpO2:  [95 %-100 %] 95 % (09/02 0458) Weight:  [94.802 kg (209 lb)] 94.802 kg (209 lb) (09/01 1134)  Intake/Output from previous day: 09/01 0701 - 09/02 0700 In: 5917.9 [P.O.:720; I.V.:4197.9; IV Piggyback:1000] Out: 9935 [Urine:4125; Drains:170; Blood:125] Intake/Output this shift: Total I/O In: 1327.1 [I.V.:1327.1] Out: 7017 [Urine:3500; Drains:40]  Physical Exam:  Constitutional: Vital signs reviewed. WD WN in NAD   Eyes: PERRL, No scleral icterus.   Pulmonary/Chest: Normal effort Abdominal: Soft. Non-tender,wounds look fine.    Lab Results:  Recent Labs  08/11/15 1048 08/12/15 0454  HGB 11.6* 11.1*  HCT 34.2* 32.4*   BMET  Recent Labs  08/12/15 0454  NA 144  K 3.8  CL 110  CO2 28  GLUCOSE 115*  BUN 10  CREATININE 0.98  CALCIUM 8.5*   No results for input(s): LABPT, INR in the last 72 hours. No results for input(s): LABURIN in the last 72 hours. No results found for this or any previous visit.  Studies/Results: No results found.  Assessment/Plan:   Will SL IV, place on reg diet. JP out later if drain fluid cr  nml.   LOS: 1 day   Andrew Williamson 08/12/2015, 6:34 AM

## 2015-09-03 DIAGNOSIS — Z23 Encounter for immunization: Secondary | ICD-10-CM | POA: Diagnosis not present

## 2015-09-08 DIAGNOSIS — M6281 Muscle weakness (generalized): Secondary | ICD-10-CM | POA: Diagnosis not present

## 2015-09-08 DIAGNOSIS — C61 Malignant neoplasm of prostate: Secondary | ICD-10-CM | POA: Diagnosis not present

## 2015-09-23 DIAGNOSIS — M6281 Muscle weakness (generalized): Secondary | ICD-10-CM | POA: Diagnosis not present

## 2015-09-23 DIAGNOSIS — C61 Malignant neoplasm of prostate: Secondary | ICD-10-CM | POA: Diagnosis not present

## 2015-10-21 DIAGNOSIS — M6281 Muscle weakness (generalized): Secondary | ICD-10-CM | POA: Diagnosis not present

## 2015-10-21 DIAGNOSIS — C61 Malignant neoplasm of prostate: Secondary | ICD-10-CM | POA: Diagnosis not present

## 2015-12-13 DIAGNOSIS — C61 Malignant neoplasm of prostate: Secondary | ICD-10-CM | POA: Diagnosis not present

## 2015-12-21 DIAGNOSIS — Z0000 Encounter for general adult medical examination without abnormal findings: Secondary | ICD-10-CM | POA: Diagnosis not present

## 2015-12-21 DIAGNOSIS — R3912 Poor urinary stream: Secondary | ICD-10-CM | POA: Diagnosis not present

## 2015-12-21 DIAGNOSIS — Z8546 Personal history of malignant neoplasm of prostate: Secondary | ICD-10-CM | POA: Diagnosis not present

## 2015-12-21 DIAGNOSIS — N5231 Erectile dysfunction following radical prostatectomy: Secondary | ICD-10-CM | POA: Diagnosis not present

## 2015-12-21 DIAGNOSIS — N393 Stress incontinence (female) (male): Secondary | ICD-10-CM | POA: Diagnosis not present

## 2016-01-26 DIAGNOSIS — Z79899 Other long term (current) drug therapy: Secondary | ICD-10-CM | POA: Diagnosis not present

## 2016-01-26 DIAGNOSIS — Z0000 Encounter for general adult medical examination without abnormal findings: Secondary | ICD-10-CM | POA: Diagnosis not present

## 2016-01-26 DIAGNOSIS — E119 Type 2 diabetes mellitus without complications: Secondary | ICD-10-CM | POA: Diagnosis not present

## 2016-01-26 DIAGNOSIS — I10 Essential (primary) hypertension: Secondary | ICD-10-CM | POA: Diagnosis not present

## 2016-01-26 DIAGNOSIS — E7800 Pure hypercholesterolemia, unspecified: Secondary | ICD-10-CM | POA: Diagnosis not present

## 2016-01-26 DIAGNOSIS — Z23 Encounter for immunization: Secondary | ICD-10-CM | POA: Diagnosis not present

## 2016-01-26 DIAGNOSIS — M549 Dorsalgia, unspecified: Secondary | ICD-10-CM | POA: Diagnosis not present

## 2016-01-26 DIAGNOSIS — K219 Gastro-esophageal reflux disease without esophagitis: Secondary | ICD-10-CM | POA: Diagnosis not present

## 2016-03-13 DIAGNOSIS — Z8546 Personal history of malignant neoplasm of prostate: Secondary | ICD-10-CM | POA: Diagnosis not present

## 2016-03-19 DIAGNOSIS — Z0000 Encounter for general adult medical examination without abnormal findings: Secondary | ICD-10-CM | POA: Diagnosis not present

## 2016-03-19 DIAGNOSIS — Z8546 Personal history of malignant neoplasm of prostate: Secondary | ICD-10-CM | POA: Diagnosis not present

## 2016-03-19 DIAGNOSIS — N393 Stress incontinence (female) (male): Secondary | ICD-10-CM | POA: Diagnosis not present

## 2016-03-19 DIAGNOSIS — N5231 Erectile dysfunction following radical prostatectomy: Secondary | ICD-10-CM | POA: Diagnosis not present

## 2016-04-16 DIAGNOSIS — M545 Low back pain: Secondary | ICD-10-CM | POA: Diagnosis not present

## 2016-04-16 DIAGNOSIS — M5416 Radiculopathy, lumbar region: Secondary | ICD-10-CM | POA: Diagnosis not present

## 2016-04-19 DIAGNOSIS — H43812 Vitreous degeneration, left eye: Secondary | ICD-10-CM | POA: Diagnosis not present

## 2016-04-19 DIAGNOSIS — H2513 Age-related nuclear cataract, bilateral: Secondary | ICD-10-CM | POA: Diagnosis not present

## 2016-04-19 DIAGNOSIS — E119 Type 2 diabetes mellitus without complications: Secondary | ICD-10-CM | POA: Diagnosis not present

## 2016-04-19 DIAGNOSIS — H01024 Squamous blepharitis left upper eyelid: Secondary | ICD-10-CM | POA: Diagnosis not present

## 2016-04-30 DIAGNOSIS — M545 Low back pain: Secondary | ICD-10-CM | POA: Diagnosis not present

## 2016-05-11 DIAGNOSIS — M545 Low back pain: Secondary | ICD-10-CM | POA: Diagnosis not present

## 2016-05-16 DIAGNOSIS — M4806 Spinal stenosis, lumbar region: Secondary | ICD-10-CM | POA: Diagnosis not present

## 2016-05-16 DIAGNOSIS — M545 Low back pain: Secondary | ICD-10-CM | POA: Diagnosis not present

## 2016-05-17 DIAGNOSIS — M4806 Spinal stenosis, lumbar region: Secondary | ICD-10-CM | POA: Diagnosis not present

## 2016-05-17 DIAGNOSIS — M545 Low back pain: Secondary | ICD-10-CM | POA: Diagnosis not present

## 2016-05-21 DIAGNOSIS — M4806 Spinal stenosis, lumbar region: Secondary | ICD-10-CM | POA: Diagnosis not present

## 2016-05-24 DIAGNOSIS — M545 Low back pain: Secondary | ICD-10-CM | POA: Diagnosis not present

## 2016-05-24 DIAGNOSIS — M4806 Spinal stenosis, lumbar region: Secondary | ICD-10-CM | POA: Diagnosis not present

## 2016-05-25 DIAGNOSIS — M4806 Spinal stenosis, lumbar region: Secondary | ICD-10-CM | POA: Diagnosis not present

## 2016-05-25 DIAGNOSIS — M545 Low back pain: Secondary | ICD-10-CM | POA: Diagnosis not present

## 2016-05-29 DIAGNOSIS — M545 Low back pain: Secondary | ICD-10-CM | POA: Diagnosis not present

## 2016-05-29 DIAGNOSIS — M4806 Spinal stenosis, lumbar region: Secondary | ICD-10-CM | POA: Diagnosis not present

## 2016-05-31 DIAGNOSIS — M4806 Spinal stenosis, lumbar region: Secondary | ICD-10-CM | POA: Diagnosis not present

## 2016-05-31 DIAGNOSIS — M545 Low back pain: Secondary | ICD-10-CM | POA: Diagnosis not present

## 2016-06-05 DIAGNOSIS — M545 Low back pain: Secondary | ICD-10-CM | POA: Diagnosis not present

## 2016-06-05 DIAGNOSIS — M4806 Spinal stenosis, lumbar region: Secondary | ICD-10-CM | POA: Diagnosis not present

## 2016-06-11 DIAGNOSIS — M4806 Spinal stenosis, lumbar region: Secondary | ICD-10-CM | POA: Diagnosis not present

## 2016-06-11 DIAGNOSIS — M545 Low back pain: Secondary | ICD-10-CM | POA: Diagnosis not present

## 2016-06-13 DIAGNOSIS — M4806 Spinal stenosis, lumbar region: Secondary | ICD-10-CM | POA: Diagnosis not present

## 2016-07-02 DIAGNOSIS — M4806 Spinal stenosis, lumbar region: Secondary | ICD-10-CM | POA: Diagnosis not present

## 2016-07-12 DIAGNOSIS — Z8546 Personal history of malignant neoplasm of prostate: Secondary | ICD-10-CM | POA: Diagnosis not present

## 2016-07-19 DIAGNOSIS — N393 Stress incontinence (female) (male): Secondary | ICD-10-CM | POA: Diagnosis not present

## 2016-07-19 DIAGNOSIS — N5231 Erectile dysfunction following radical prostatectomy: Secondary | ICD-10-CM | POA: Diagnosis not present

## 2016-07-19 DIAGNOSIS — Z8546 Personal history of malignant neoplasm of prostate: Secondary | ICD-10-CM | POA: Diagnosis not present

## 2016-07-26 DIAGNOSIS — M4806 Spinal stenosis, lumbar region: Secondary | ICD-10-CM | POA: Diagnosis not present

## 2016-07-26 DIAGNOSIS — E7800 Pure hypercholesterolemia, unspecified: Secondary | ICD-10-CM | POA: Diagnosis not present

## 2016-07-26 DIAGNOSIS — Z79899 Other long term (current) drug therapy: Secondary | ICD-10-CM | POA: Diagnosis not present

## 2016-07-26 DIAGNOSIS — Z7984 Long term (current) use of oral hypoglycemic drugs: Secondary | ICD-10-CM | POA: Diagnosis not present

## 2016-07-26 DIAGNOSIS — I10 Essential (primary) hypertension: Secondary | ICD-10-CM | POA: Diagnosis not present

## 2016-07-26 DIAGNOSIS — K219 Gastro-esophageal reflux disease without esophagitis: Secondary | ICD-10-CM | POA: Diagnosis not present

## 2016-07-26 DIAGNOSIS — E119 Type 2 diabetes mellitus without complications: Secondary | ICD-10-CM | POA: Diagnosis not present

## 2016-07-31 DIAGNOSIS — M4806 Spinal stenosis, lumbar region: Secondary | ICD-10-CM | POA: Diagnosis not present

## 2016-08-24 DIAGNOSIS — M4806 Spinal stenosis, lumbar region: Secondary | ICD-10-CM | POA: Diagnosis not present

## 2016-08-28 DIAGNOSIS — Z23 Encounter for immunization: Secondary | ICD-10-CM | POA: Diagnosis not present

## 2016-10-05 DIAGNOSIS — R142 Eructation: Secondary | ICD-10-CM | POA: Diagnosis not present

## 2016-10-05 DIAGNOSIS — R079 Chest pain, unspecified: Secondary | ICD-10-CM | POA: Diagnosis not present

## 2016-10-17 ENCOUNTER — Encounter: Payer: Self-pay | Admitting: Physician Assistant

## 2016-10-17 ENCOUNTER — Ambulatory Visit (INDEPENDENT_AMBULATORY_CARE_PROVIDER_SITE_OTHER): Payer: Medicare Other | Admitting: Physician Assistant

## 2016-10-17 ENCOUNTER — Encounter (INDEPENDENT_AMBULATORY_CARE_PROVIDER_SITE_OTHER): Payer: Self-pay

## 2016-10-17 VITALS — BP 130/60 | HR 87 | Ht 69.0 in | Wt 210.8 lb

## 2016-10-17 DIAGNOSIS — I10 Essential (primary) hypertension: Secondary | ICD-10-CM

## 2016-10-17 DIAGNOSIS — E7800 Pure hypercholesterolemia, unspecified: Secondary | ICD-10-CM | POA: Diagnosis not present

## 2016-10-17 DIAGNOSIS — R072 Precordial pain: Secondary | ICD-10-CM | POA: Diagnosis not present

## 2016-10-17 NOTE — Patient Instructions (Addendum)
Medication Instructions:  Your physician recommends that you continue on your current medications as directed. Please refer to the Current Medication list given to you today.  Labwork: NONE  Testing/Procedures: 1. Your physician has requested that you have en exercise stress myoview. For further information please visit [email or web address]. Please follow instruction sheet, as given.  2.3 Your physician has requested that you have an echocardiogram. Echocardiography is a painless test that uses sound waves to create images of your heart. It provides your doctor with information about the size and shape of your heart and how well your heart's chambers and valves are working. This procedure takes approximately one hour. There are no restrictions for this procedure.  Follow-Up: DR. Marlou Porch AS NEEDED   Any Other Special Instructions Will Be Listed Below (If Applicable).  If you need a refill on your cardiac medications before your next appointment, please call your pharmacy.

## 2016-10-17 NOTE — Progress Notes (Signed)
Cardiology Office Note:    Date:  10/17/2016   ID:  Andrew Williamson, DOB Apr 11, 1946, MRN [id number]  PCP:  Lujean Amel, MD  Cardiologist:  New - seen by Dr. Candee Furbish today  Electrophysiologist:  n/a Urologist: Dr. Irine Seal  Referring MD: Lujean Amel, MD   Chief Complaint  Patient presents with  . Chest Pain    History of Present Illness:    Andrew Williamson is a 70 y.o. male with a hx of HTN, DM2, HL, prostate cancer status post radical prostatectomy in 9/16 who is referred by his PCP for evaluation of chest pain.  He has a history of spinal stenosis. He rides a stationary bike for exercise. He usually rides every day of the week for 30 minutes. About 2 weeks ago, he started to notice epigastric discomfort described as pressure with some sensation in his neck. This would last until he stopped exercising. It did not cause him to stop exercising. He was not short of breath. He was eating right before exercise. He changed the timing of his meals after seeing his PCP. Since that time, he has not had recurrent symptoms. He denies any other symptoms of exertional chest discomfort. He denies orthopnea, PND or edema. He denies decreased exercise tolerance. He denies syncope.  PAD Screen 10/17/2016  Previous PAD dx? No  Previous surgical procedure? No  Pain with walking? Yes  Subsides with rest? No  Feet/toe relief with dangling? No  Painful, non-healing ulcers? No  Extremities discolored? No     Prior CV studies that were reviewed today include:    None   Past Medical History:  Diagnosis Date  . Allergic rhinitis   . 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 DJD (degenerative joint disease)    hips  . ED (erectile dysfunction)   . GERD (gastroesophageal reflux disease)   . Gynecomastia, male   . HLD (hyperlipidemia)    hx of ? elevated LFTs on statin Rx  . Hypertension   . Obesity   . Prostate cancer [facility])    s/p radical  prostatectomy  . Spinal stenosis     Past Surgical History:  Procedure Laterality Date  . COLONOSCOPY  06/2014   FU 10 years  . LAPAROSCOPIC CHOLECYSTECTOMY     2012  . ROBOT ASSISTED LAPAROSCOPIC RADICAL PROSTATECTOMY N/A 08/11/2015   Procedure: ROBOTIC ASSISTED LAPAROSCOPIC RADICAL PROSTATECTOMY;  Surgeon: Irine Seal, MD;  Location: [facility];  Service: Urology;  Laterality: N/A;    Current Medications: Current Meds  Medication Sig  . gabapentin (NEURONTIN) 300 MG capsule Take 300 mg by mouth 3 (three) times daily.   Marland Kitchen lisinopril (PRINIVIL,ZESTRIL) 10 MG tablet Take 10 mg by mouth every morning.  . metFORMIN (GLUCOPHAGE) 500 MG tablet Take 2 tablets by mouth 2 (two) times daily.  . metronidazole (NORITATE) 1 % cream Apply 1 application topically daily as needed (rosacea flare up.).  Marland Kitchen Multiple Vitamins-Minerals (CENTRUM SILVER PO) Take 1 tablet by mouth daily.  Marland Kitchen omeprazole (PRILOSEC) 40 MG capsule Take 1 capsule by mouth every morning.  . pravastatin (PRAVACHOL) 20 MG tablet Take 20 mg by mouth every morning.     Allergies:   Thimerosal   Social History   Social History  . Marital status: Widowed    Spouse name: N/A  . Number of children: N/A  . Years of education: N/A   Social History Main Topics  . Smoking status: Never Smoker  . Smokeless tobacco:  Current User    Types: Chew  . Alcohol use Yes     Comment: rarely  . Drug use: No  . Sexual activity: Not Asked   Other Topics Concern  . 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   ROS:     Please see the history of present illness.    Review of Systems  Cardiovascular: Positive for chest pain.   All other systems reviewed and are negative.   EKGs/Labs/Other Test Reviewed:    EKG:  EKG is  ordered today.  The ekg ordered today demonstrates NSR, HR 87, normal axis, IVCD, PAC, QTc 450 ms  ECG from primary care dated 10/05/16 was personally reviewed and demonstrates NSR, HR 81, normal axis, nonspecific ST-T wave changes, QTc 416; no significant change when compared to prior tracing dated 07/09/11  Recent Labs: No results found for requested labs within last 8760 hours.  Labs from PCP dated 07/26/16: Hemoglobin A1c 6.1, BUN 21, creatinine 1.03, K4.1, ALT 21  Recent Lipid Panel No results found for: CHOL, TRIG, HDL, CHOLHDL, VLDL, LDLCALC, LDLDIRECT Labs from PCP dated 01/26/16: TC 145, TG 151, HDL 36, LDL 79  Physical Exam:    VS:  BP 130/60 (BP Location: Left Arm, Cuff Size: Normal)   Pulse 87   Ht 5\' 9"  (1.753 m)   Wt 210 lb 12.8 oz (95.6 kg)   BMI 31.13 kg/m     Wt Readings from Last 3 Encounters:  10/17/16 210 lb 12.8 oz (95.6 kg)  08/11/15 209 lb (94.8 kg)  08/05/15 209 lb (94.8 kg)     Physical Exam  Constitutional: He is oriented to person, place, and time. He appears well-developed and well-nourished. No distress.  HENT:  Head: Normocephalic and atraumatic.  Eyes: No scleral icterus.  Neck: No JVD present. Carotid bruit is not present. No thyromegaly present.  Cardiovascular: Normal rate, regular rhythm, S1 normal and S2 normal.   No murmur heard. Pulses:      Dorsalis pedis pulses are 2+ on the right side, and 2+ on the left side.       Posterior tibial pulses are 2+ on the right side, and 2+ on the left side.  Pulmonary/Chest: Breath sounds normal. He has no wheezes. He has no rhonchi. He has no rales.  Abdominal: Soft. There is no hepatomegaly. There is no tenderness.  Musculoskeletal: He exhibits no edema.  Neurological: He is alert and oriented  to person, place, and time.  Skin: Skin is warm and dry.  Psychiatric: He has a normal mood and affect.    ASSESSMENT:    1. Precordial pain   2. Essential hypertension   3. Pure hypercholesterolemia    PLAN:    In order of problems listed above:  1. Chest pain - He presents for evaluation of chest pain with riding his stationary bike.  Symptoms were mild and did not interrupt his exercise.  He noted excessive belching and changed his eating habits. Since that time he  has not had recurrent chest pain. His symptoms have some typical features.  He has a lot of CRFs including DM, HTN, HL, FHx CAD.  Reviewed with Dr. Candee Furbish who also saw the patient.  -  Echocardiogram   -  ETT-Myoview  -  FU prn unless testing is abnormal  2. HTN - BP is controlled on current Rx.   3. HL - Optimal LDL on Pravastatin.     Medication Adjustments/Labs and Tests Ordered: Current medicines are reviewed at length with the patient today.  Concerns regarding medicines are outlined above.  Medication changes, Labs and Tests ordered today are outlined in the Patient Instructions noted below. Patient Instructions  Medication Instructions:  Your physician recommends that you continue on your current medications as directed. Please refer to the Current Medication list given to you today.  Labwork: NONE  Testing/Procedures: 1. Your physician has requested that you have en exercise stress myoview. For further information please visit [email or web address]. Please follow instruction sheet, as given.  2.3 Your physician has requested that you have an echocardiogram. Echocardiography is a painless test that uses sound waves to create images of your heart. It provides your doctor with information about the size and shape of your heart and how well your heart's chambers and valves are working. This procedure takes approximately one hour. There are no restrictions for this procedure.  Follow-Up: DR. Marlou Porch AS  NEEDED   Any Other Special Instructions Will Be Listed Below (If Applicable).  If you need a refill on your cardiac medications before your next appointment, please call your pharmacy.  Signed, [name], PA-C  10/17/2016 3:41 PM    [facility] [address] [place], [place], [place]  [address] Phone: [phone]; Fax: [phone]   Personally seen and examined. Agree with above.  Given his current risk factors as displayed above, we will proceed with nuclear stress test. He is willing to proceed. Heart is regular rate and rhythm.  Candee Furbish, MD

## 2016-10-22 DIAGNOSIS — M48061 Spinal stenosis, lumbar region without neurogenic claudication: Secondary | ICD-10-CM | POA: Diagnosis not present

## 2016-11-08 ENCOUNTER — Telehealth (HOSPITAL_COMMUNITY): Payer: Self-pay | Admitting: *Deleted

## 2016-11-08 NOTE — Telephone Encounter (Signed)
Patient given detailed instructions per Myocardial Perfusion Study Information Sheet for the test on 11/13/16 at 1000. Patient notified to arrive 15 minutes early and that it is imperative to arrive on time for appointment to keep from having the test rescheduled.  If you need to cancel or reschedule your appointment, please call the office within 24 hours of your appointment. Failure to do so may result in a cancellation of your appointment, and a $50 no show fee. Patient verbalized understanding.Brooklee Michelin, Ranae [place]ms

## 2016-11-13 ENCOUNTER — Ambulatory Visit (HOSPITAL_COMMUNITY): Payer: Medicare Other | Attending: Cardiology

## 2016-11-13 ENCOUNTER — Encounter: Payer: Self-pay | Admitting: Physician Assistant

## 2016-11-13 ENCOUNTER — Encounter: Payer: Self-pay | Admitting: *Deleted

## 2016-11-13 ENCOUNTER — Ambulatory Visit (HOSPITAL_BASED_OUTPATIENT_CLINIC_OR_DEPARTMENT_OTHER): Payer: Medicare Other

## 2016-11-13 ENCOUNTER — Other Ambulatory Visit: Payer: Self-pay

## 2016-11-13 DIAGNOSIS — I10 Essential (primary) hypertension: Secondary | ICD-10-CM | POA: Insufficient documentation

## 2016-11-13 DIAGNOSIS — R072 Precordial pain: Secondary | ICD-10-CM | POA: Diagnosis not present

## 2016-11-13 DIAGNOSIS — E119 Type 2 diabetes mellitus without complications: Secondary | ICD-10-CM | POA: Diagnosis not present

## 2016-11-13 DIAGNOSIS — E785 Hyperlipidemia, unspecified: Secondary | ICD-10-CM | POA: Diagnosis not present

## 2016-11-13 LAB — MYOCARDIAL PERFUSION IMAGING
CHL CUP NUCLEAR SDS: 6
CHL CUP RESTING HR STRESS: 81 {beats}/min
CSEPEDS: 0 s
CSEPPHR: 144 {beats}/min
Estimated workload: 7 METS
Exercise duration (min): 6 min
LVDIAVOL: 89 mL (ref 62–150)
LVSYSVOL: 40 mL
MPHR: 150 {beats}/min
Percent HR: 96 %
RATE: 0.22
RPE: 18
SRS: 4
SSS: 8
TID: 1

## 2016-11-13 MED ORDER — TECHNETIUM TC 99M TETROFOSMIN IV KIT
32.9000 | PACK | Freq: Once | INTRAVENOUS | Status: AC | PRN
  Administered 2016-11-13: 32.9 via INTRAVENOUS
  Filled 2016-11-13: qty 33

## 2016-11-13 MED ORDER — TECHNETIUM TC 99M TETROFOSMIN IV KIT
10.4000 | PACK | Freq: Once | INTRAVENOUS | Status: AC | PRN
  Administered 2016-11-13: 10.4 via INTRAVENOUS
  Filled 2016-11-13: qty 11

## 2016-11-14 ENCOUNTER — Telehealth: Payer: Self-pay | Admitting: *Deleted

## 2016-11-14 ENCOUNTER — Encounter: Payer: Self-pay | Admitting: Physician Assistant

## 2016-11-14 MED ORDER — NITROGLYCERIN 0.4 MG SL SUBL: 0.4000 mg | SUBLINGUAL_TABLET | SUBLINGUAL | 3 refills | Status: DC | PRN

## 2016-11-14 NOTE — Telephone Encounter (Signed)
Pt notified of echo and myoview results and findings by phone with verbal understanding. Pt s/w [name], PA about abnormal myoview results as well as to the recommendation for a cath. Per Brynda Rim. PA pt needs to be seen to discuss cath, Rx NTG has been called into [facility]. I have gone over as to how and when to use NTG. Pt agreeable to plan of care with verbal understanding to instructions. Pt scheduled to see Scoot W. PA 12/12 @ 11:45;

## 2016-11-20 ENCOUNTER — Encounter: Payer: Self-pay | Admitting: Physician Assistant

## 2016-11-20 ENCOUNTER — Encounter: Payer: Self-pay | Admitting: *Deleted

## 2016-11-20 ENCOUNTER — Ambulatory Visit (INDEPENDENT_AMBULATORY_CARE_PROVIDER_SITE_OTHER): Payer: Medicare Other | Admitting: Physician Assistant

## 2016-11-20 VITALS — BP 130/60 | HR 78 | Ht 69.0 in | Wt 215.0 lb

## 2016-11-20 DIAGNOSIS — E119 Type 2 diabetes mellitus without complications: Secondary | ICD-10-CM | POA: Insufficient documentation

## 2016-11-20 DIAGNOSIS — I259 Chronic ischemic heart disease, unspecified: Secondary | ICD-10-CM

## 2016-11-20 DIAGNOSIS — E7800 Pure hypercholesterolemia, unspecified: Secondary | ICD-10-CM | POA: Insufficient documentation

## 2016-11-20 DIAGNOSIS — I10 Essential (primary) hypertension: Secondary | ICD-10-CM | POA: Diagnosis not present

## 2016-11-20 DIAGNOSIS — I209 Angina pectoris, unspecified: Secondary | ICD-10-CM

## 2016-11-20 LAB — CBC
HEMATOCRIT: 39.2 % (ref 38.5–50.0)
HEMOGLOBIN: 13.3 g/dL (ref 13.2–17.1)
MCH: 31.5 pg (ref 27.0–33.0)
MCHC: 33.9 g/dL (ref 32.0–36.0)
MCV: 92.9 fL (ref 80.0–100.0)
MPV: 10.2 fL (ref 7.5–12.5)
Platelets: 230 10*3/uL (ref 140–400)
RBC: 4.22 MIL/uL (ref 4.20–5.80)
RDW: 13 % (ref 11.0–15.0)
WBC: 8 10*3/uL (ref 3.8–10.8)

## 2016-11-20 MED ORDER — ISOSORBIDE MONONITRATE ER 30 MG PO TB24: 15.0000 mg | ORAL_TABLET | Freq: Every day | ORAL | 3 refills | Status: DC

## 2016-11-20 NOTE — Progress Notes (Signed)
Cardiology Office Note:    Date:  11/20/2016   ID:  [name], DOB July 14, 1946, MRN [id number]  PCP:  Lujean Amel, MD  Cardiologist:   Dr. Candee Furbish   Electrophysiologist:  n/a Urologist: Dr. Irine Seal  Referring MD: Lujean Amel, MD   Chief Complaint  Patient presents with  . Follow-up    chest pain, abnormal Myoview    History of Present Illness:    Andrew Williamson is a 70 y.o. male with a hx of HTN, DM2, HL, prostate cancer status post radical prostatectomy in 9/16.  He was recently seen by Dr. Candee Furbish and me for evaluation of chest pain.  This would occur while riding a stationary bike but seemed to resolve after he stopped eating before exercise.  ETT-Myoview was arranged.  This returned abnormal with possible inferolateral ischemia.  He returns to discuss his test results and to arrange cardiac catheterization.    He is here alone today. He continues to ride a stationary bike. He notes some discomfort in his chest while exercising. This is unchanged. He denies rest symptoms. He denies orthopnea, PND or edema. He denies significant dyspnea. He denies syncope.   Prior CV studies that were reviewed today include:    Echo 12/17:  EF 55-60, normal wall motion, grade 1 diastolic dysfunction  Myoview 12/17: EF 55, inf-lateral ischemia; intermediate risk  Past Medical History:  Diagnosis Date  . Allergic rhinitis   . 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 DJD (degenerative joint disease)    hips  . ED (erectile dysfunction)   . GERD (gastroesophageal reflux disease)   . Gynecomastia, male   . History of echocardiogram    Echo 12/17: EF 55-60, normal wall motion, grade 1 diastolic dysfunction  . History of nuclear stress test    Myoview 12/17: EF 55, inf-lateral ischemia; intermediate risk  . HLD (hyperlipidemia)    hx of ? elevated LFTs on statin Rx  . Hypertension   . Obesity   . Prostate cancer [facility])    s/p  radical prostatectomy  . Spinal stenosis     Past Surgical History:  Procedure Laterality Date  . COLONOSCOPY  06/2014   FU 10 years  . LAPAROSCOPIC CHOLECYSTECTOMY     2012  . ROBOT ASSISTED LAPAROSCOPIC RADICAL PROSTATECTOMY N/A 08/11/2015   Procedure: ROBOTIC ASSISTED LAPAROSCOPIC RADICAL PROSTATECTOMY;  Surgeon: Irine Seal, MD;  Location: [facility];  Service: Urology;  Laterality: N/A;    Current Medications: Current Meds  Medication Sig  . aspirin EC 81 MG tablet Take 81 mg by mouth daily.  Marland Kitchen gabapentin (NEURONTIN) 300 MG capsule Take 300 mg by mouth 3 (three) times daily.   Marland Kitchen lisinopril (PRINIVIL,ZESTRIL) 10 MG tablet Take 10 mg by mouth every morning.  . metFORMIN (GLUCOPHAGE) 500 MG tablet Take 2 tablets by mouth 2 (two) times daily.  . metronidazole (NORITATE) 1 % cream Apply 1 application topically daily as needed (rosacea flare up.).  Marland Kitchen Multiple Vitamins-Minerals (CENTRUM SILVER PO) Take 1 tablet by mouth daily.  . nitroGLYCERIN (NITROSTAT) 0.4 MG SL tablet Place 1 tablet (0.4 mg total) under the tongue every 5 (five) minutes as needed.  Marland Kitchen omeprazole (PRILOSEC) 40 MG capsule Take 1 capsule by mouth every morning.  . pravastatin (PRAVACHOL) 20 MG tablet Take 20 mg by mouth every morning.     Allergies:   Thimerosal   Social History   Social History  . Marital status: Widowed  Spouse name: N/A  . Number of children: N/A  . Years of education: N/A   Social History Main Topics  . Smoking status: Never Smoker  . Smokeless tobacco: Current User    Types: Chew  . Alcohol use Yes     Comment: rarely  . Drug use: No  . Sexual activity: Not Asked   Other Topics Concern  . 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   ROS:   Please see the history of present illness.    ROS All other systems reviewed and are negative.   EKGs/Labs/Other Test Reviewed:    EKG:  EKG  ordered today.  The ekg ordered today demonstrates NSR, HR 78, normal axis, QTC 430 ms   Recent Labs: No results found for requested labs within last 8760 hours.  Labs from PCP dated 07/26/16: Hemoglobin A1c 6.1, BUN 21, creatinine 1.03, K4.1, ALT 21  Recent Lipid Panel No results found for: CHOL, TRIG, HDL, CHOLHDL, VLDL, LDLCALC, LDLDIRECT Labs from PCP dated 01/26/16: TC 145, TG 151, HDL 36, LDL 79  Physical Exam:    VS:  BP 130/60   Pulse 78   Ht 5\' 9"  (1.753 m)   Wt 215 lb (97.5 kg)   BMI 31.75 kg/m     Wt Readings from Last 3 Encounters:  11/20/16 215 lb (97.5 kg)  11/13/16 210 lb (95.3 kg)  10/17/16 210 lb 12.8 oz (95.6 kg)     Physical Exam  Constitutional: He is oriented to person, place, and time. He appears well-developed and well-nourished. No distress.  HENT:  Head: Normocephalic and atraumatic.  Eyes: No scleral icterus.  Neck: No JVD present.  Cardiovascular: Normal rate, regular rhythm and normal heart sounds.   No murmur heard. Pulmonary/Chest: Effort normal. He has no wheezes. He has no rales.  Abdominal: Soft. There is no tenderness.  Musculoskeletal: He exhibits no edema.  Neurological: He is alert and oriented to person, place, and time.  Skin: Skin is warm and dry.  Psychiatric: He has a normal mood and affect.    ASSESSMENT:    1. Chest pain due to myocardial ischemia, unspecified ischemic chest pain type ([name])   2. Essential hypertension   3. Pure hypercholesterolemia   4. Type 2 diabetes mellitus without complication, without long-term current use of insulin (HCC)    PLAN:    In order of problems listed  above:  1. Chest pain - He does have a history of exertional chest discomfort. This seems to be with more extreme activities. Recent nuclear stress test indicates probable inferolateral ischemia. Risk factors for coronary artery disease including diabetes, hypertension and family history of CAD. I have reviewed his stress test with Dr. Marlou Porch. We have recommended proceeding with cardiac catheterization.  Risks and benefits of cardiac catheterization have been discussed with the patient.  These include bleeding, infection, kidney damage, stroke, heart attack, death.  The patient understands these risks and is willing to proceed. He would like to wait  until he comes back from a trip to [place] next week.  As his symptoms have been stable, he should be safe to travel.  He knows to go to the ED if his symptoms change.   -  Proceed with LHC with Dr. Irish Lack 12/26  -  Add Imdur 15 QD - he does not take PDE-5 inhibitors  2. HTN - BP stable.   3. HL -  Continue statin.  4. DM - Hold Metformin 24 hours before and 48 hours after his catheterization.    Medication Adjustments/Labs and Tests Ordered: Current medicines are reviewed at length with the patient today.  Concerns regarding medicines are outlined above.  Medication changes, Labs and Tests ordered today are outlined in the Patient Instructions noted below. Patient Instructions  Medication Instructions:  1. START IMDUR 15 MG DAILY (THIS WILL BE 1/2 TABLET OF THE 30 MG TABLET)  Labwork: TODAY BMET, CBC , PT/INR  Testing/Procedures: Your physician has requested that you have a cardiac catheterization. Cardiac catheterization is used to diagnose and/or treat various heart conditions. Doctors may recommend this procedure for a number of different reasons. The most common reason is to evaluate chest pain. Chest pain can be a symptom of coronary artery disease (CAD), and cardiac catheterization can show whether plaque is narrowing or blocking your  heart's arteries. This procedure is also used to evaluate the valves, as well as measure the blood flow and oxygen levels in different parts of your heart. For further information please visit HugeFiesta.[email or web address]. Please follow instruction sheet, as given.  Follow-Up: 12/21/16 @ 12:15 WITH SCOTT WEAVER, PAC SAME DAY DR. Marlou Porch IS THE OFFICE  Any Other Special Instructions Will Be Listed Below (If Applicable).  If you need a refill on your cardiac medications before your next appointment, please call your pharmacy.  Signed, [name], PA-C  11/20/2016 4:46 PM    [facility] [address], [place], [place]  [address] Phone: [phone]; Fax: [phone]

## 2016-11-20 NOTE — Patient Instructions (Addendum)
Medication Instructions:  1. START IMDUR 15 MG DAILY (THIS WILL BE 1/2 TABLET OF THE 30 MG TABLET)  Labwork: TODAY BMET, CBC , PT/INR  Testing/Procedures: Your physician has requested that you have a cardiac catheterization. Cardiac catheterization is used to diagnose and/or treat various heart conditions. Doctors may recommend this procedure for a number of different reasons. The most common reason is to evaluate chest pain. Chest pain can be a symptom of coronary artery disease (CAD), and cardiac catheterization can show whether plaque is narrowing or blocking your heart's arteries. This procedure is also used to evaluate the valves, as well as measure the blood flow and oxygen levels in different parts of your heart. For further information please visit HugeFiesta.[email or web address]. Please follow instruction sheet, as given.  Follow-Up: 12/21/16 @ 12:15 WITH SCOTT WEAVER, PAC SAME DAY DR. Marlou Porch IS THE OFFICE  Any Other Special Instructions Will Be Listed Below (If Applicable).  If you need a refill on your cardiac medications before your next appointment, please call your pharmacy.

## 2016-11-21 ENCOUNTER — Telehealth: Payer: Self-pay | Admitting: Physician Assistant

## 2016-11-21 ENCOUNTER — Telehealth: Payer: Self-pay | Admitting: Cardiology

## 2016-11-21 LAB — BASIC METABOLIC PANEL
BUN: 22 mg/dL (ref 7–25)
CALCIUM: 9.5 mg/dL (ref 8.6–10.3)
CO2: 26 mmol/L (ref 20–31)
Chloride: 105 mmol/L (ref 98–110)
Creat: 1.22 mg/dL — ABNORMAL HIGH (ref 0.70–1.18)
Glucose, Bld: 120 mg/dL — ABNORMAL HIGH (ref 65–99)
POTASSIUM: 4.5 mmol/L (ref 3.5–5.3)
SODIUM: 144 mmol/L (ref 135–146)

## 2016-11-21 LAB — PROTIME-INR
INR: 1
PROTHROMBIN TIME: 10.5 s (ref 9.0–11.5)

## 2016-11-21 NOTE — Telephone Encounter (Signed)
[address] [place], [place]   11/21/2016 7:53 PM

## 2016-11-21 NOTE — Telephone Encounter (Signed)
New message  Pt is calling in regards to instructions to take an asprin prior to procedure for cath  Is the asprin regular 325mg  or 81mg ?  Please call back and advise

## 2016-11-21 NOTE — Telephone Encounter (Signed)
Pt called to verify his ASA dose of the day of the cath. I verified with the pt Aspirin 81 mg the morning of cath. Pt said thank you.

## 2016-11-21 NOTE — Telephone Encounter (Signed)
S/w pt who asked to change his cath date and time due to he cannot get his family to take him on 12/04/16 as originally scheduled. Pt has been reschedule to 12/05/16 @ 10 am and will need to arrive by 8 am at [facility]. Pt thanked me for moving the date and time. Pt aware to still follow instructions that were given to him the other day.

## 2016-11-21 NOTE — Telephone Encounter (Signed)
Andrew Williamson is calling to have his heart cath rescheduled . He is asking that it not be scheduled for early morning . Please call

## 2016-11-22 ENCOUNTER — Telehealth: Payer: Self-pay | Admitting: Cardiology

## 2016-11-22 NOTE — Telephone Encounter (Signed)
Patient made of lab results. Patient was told that his kidney function (BUN, Creatinine) is stable/within acceptable limits, his glucose (sugar) is high, and that all other parameters are within acceptable limits and no further intervention or testing required. Patient instructed to continue with current treatment plan. Patient verbalizes understanding and appreciated the call.

## 2016-11-22 NOTE — Telephone Encounter (Signed)
Mr.Santillana is calling to get test result s

## 2016-12-04 ENCOUNTER — Encounter: Payer: Self-pay | Admitting: *Deleted

## 2016-12-05 ENCOUNTER — Other Ambulatory Visit (HOSPITAL_COMMUNITY): Payer: Medicare Other

## 2016-12-05 ENCOUNTER — Ambulatory Visit (HOSPITAL_COMMUNITY)
Admission: RE | Admit: 2016-12-05 | Discharge: 2016-12-05 | Disposition: A | Discharge status: Home / Self Care | Payer: Medicare Other | Source: Ambulatory Visit | Attending: Interventional Cardiology | Admitting: Interventional Cardiology

## 2016-12-05 ENCOUNTER — Other Ambulatory Visit: Payer: Self-pay | Admitting: *Deleted

## 2016-12-05 ENCOUNTER — Encounter (HOSPITAL_COMMUNITY)
Admission: RE | Disposition: A | Discharge status: Home / Self Care | Payer: Self-pay | Source: Ambulatory Visit | Attending: Interventional Cardiology

## 2016-12-05 DIAGNOSIS — I2510 Atherosclerotic heart disease of native coronary artery without angina pectoris: Secondary | ICD-10-CM

## 2016-12-05 DIAGNOSIS — J309 Allergic rhinitis, unspecified: Secondary | ICD-10-CM | POA: Insufficient documentation

## 2016-12-05 DIAGNOSIS — Z6831 Body mass index (BMI) 31.0-31.9, adult: Secondary | ICD-10-CM | POA: Diagnosis not present

## 2016-12-05 DIAGNOSIS — K219 Gastro-esophageal reflux disease without esophagitis: Secondary | ICD-10-CM | POA: Diagnosis not present

## 2016-12-05 DIAGNOSIS — R809 Proteinuria, unspecified: Secondary | ICD-10-CM | POA: Insufficient documentation

## 2016-12-05 DIAGNOSIS — R9439 Abnormal result of other cardiovascular function study: Secondary | ICD-10-CM

## 2016-12-05 DIAGNOSIS — Z8249 Family history of ischemic heart disease and other diseases of the circulatory system: Secondary | ICD-10-CM | POA: Diagnosis not present

## 2016-12-05 DIAGNOSIS — E7800 Pure hypercholesterolemia, unspecified: Secondary | ICD-10-CM | POA: Diagnosis not present

## 2016-12-05 DIAGNOSIS — Z7982 Long term (current) use of aspirin: Secondary | ICD-10-CM | POA: Diagnosis not present

## 2016-12-05 DIAGNOSIS — I10 Essential (primary) hypertension: Secondary | ICD-10-CM | POA: Insufficient documentation

## 2016-12-05 DIAGNOSIS — E785 Hyperlipidemia, unspecified: Secondary | ICD-10-CM | POA: Insufficient documentation

## 2016-12-05 DIAGNOSIS — Z7984 Long term (current) use of oral hypoglycemic drugs: Secondary | ICD-10-CM | POA: Diagnosis not present

## 2016-12-05 DIAGNOSIS — F17220 Nicotine dependence, chewing tobacco, uncomplicated: Secondary | ICD-10-CM | POA: Diagnosis not present

## 2016-12-05 DIAGNOSIS — E119 Type 2 diabetes mellitus without complications: Secondary | ICD-10-CM | POA: Diagnosis not present

## 2016-12-05 DIAGNOSIS — I259 Chronic ischemic heart disease, unspecified: Secondary | ICD-10-CM

## 2016-12-05 DIAGNOSIS — Z8546 Personal history of malignant neoplasm of prostate: Secondary | ICD-10-CM | POA: Diagnosis not present

## 2016-12-05 DIAGNOSIS — E669 Obesity, unspecified: Secondary | ICD-10-CM | POA: Diagnosis not present

## 2016-12-05 DIAGNOSIS — N529 Male erectile dysfunction, unspecified: Secondary | ICD-10-CM | POA: Diagnosis not present

## 2016-12-05 DIAGNOSIS — M1990 Unspecified osteoarthritis, unspecified site: Secondary | ICD-10-CM | POA: Diagnosis not present

## 2016-12-05 HISTORY — PX: CARDIAC CATHETERIZATION: SHX172

## 2016-12-05 LAB — GLUCOSE, CAPILLARY
GLUCOSE-CAPILLARY: 159 mg/dL — AB (ref 65–99)
GLUCOSE-CAPILLARY: 107 mg/dL — AB (ref 65–99)

## 2016-12-05 LAB — POCT ACTIVATED CLOTTING TIME
ACTIVATED CLOTTING TIME: 252 s
Activated Clotting Time: 252 seconds

## 2016-12-05 SURGERY — LEFT HEART CATH AND CORONARY ANGIOGRAPHY

## 2016-12-05 MED ORDER — SODIUM CHLORIDE 0.9 % WEIGHT BASED INFUSION
3.0000 mL/kg/h | INTRAVENOUS | Status: AC
  Administered 2016-12-05: 3 mL/kg/h via INTRAVENOUS

## 2016-12-05 MED ORDER — FENTANYL CITRATE (PF) 100 MCG/2ML IJ SOLN
INTRAMUSCULAR | Status: AC
  Filled 2016-12-05: qty 2

## 2016-12-05 MED ORDER — IOPAMIDOL (ISOVUE-370) INJECTION 76%
INTRAVENOUS | Status: AC
  Filled 2016-12-05: qty 100

## 2016-12-05 MED ORDER — NITROGLYCERIN 0.4 MG SL SUBL: 0.4000 mg | SUBLINGUAL_TABLET | SUBLINGUAL | 3 refills | Status: DC | PRN

## 2016-12-05 MED ORDER — NITROGLYCERIN 1 MG/10 ML FOR IR/CATH LAB
INTRA_ARTERIAL | Status: DC | PRN
  Administered 2016-12-05: 200 ug via INTRACORONARY

## 2016-12-05 MED ORDER — IOPAMIDOL (ISOVUE-370) INJECTION 76%
INTRAVENOUS | Status: DC | PRN
  Administered 2016-12-05: 125 mL via INTRA_ARTERIAL

## 2016-12-05 MED ORDER — SODIUM CHLORIDE 0.9 % WEIGHT BASED INFUSION: 1.0000 mL/kg/h | INTRAVENOUS | Status: DC

## 2016-12-05 MED ORDER — METOPROLOL TARTRATE 5 MG/5ML IV SOLN
INTRAVENOUS | Status: AC
  Filled 2016-12-05: qty 5

## 2016-12-05 MED ORDER — SODIUM CHLORIDE 0.9% FLUSH: 3.0000 mL | Freq: Two times a day (BID) | INTRAVENOUS | Status: DC

## 2016-12-05 MED ORDER — NITROGLYCERIN 1 MG/10 ML FOR IR/CATH LAB
INTRA_ARTERIAL | Status: AC
  Filled 2016-12-05: qty 10

## 2016-12-05 MED ORDER — VERAPAMIL HCL 2.5 MG/ML IV SOLN
INTRAVENOUS | Status: AC
  Filled 2016-12-05: qty 2

## 2016-12-05 MED ORDER — SODIUM CHLORIDE 0.9 % IV SOLN: INTRAVENOUS | Status: AC

## 2016-12-05 MED ORDER — FENTANYL CITRATE (PF) 100 MCG/2ML IJ SOLN
INTRAMUSCULAR | Status: DC | PRN
  Administered 2016-12-05 (×3): 25 ug via INTRAVENOUS

## 2016-12-05 MED ORDER — LIDOCAINE HCL (PF) 1 % IJ SOLN
INTRAMUSCULAR | Status: DC | PRN
  Administered 2016-12-05: 2 mL via INTRADERMAL

## 2016-12-05 MED ORDER — ADENOSINE 12 MG/4ML IV SOLN
INTRAVENOUS | Status: AC
  Filled 2016-12-05: qty 16

## 2016-12-05 MED ORDER — HEPARIN (PORCINE) IN NACL 2-0.9 UNIT/ML-% IJ SOLN
INTRAMUSCULAR | Status: DC | PRN
  Administered 2016-12-05: 1000 mL

## 2016-12-05 MED ORDER — MIDAZOLAM HCL 2 MG/2ML IJ SOLN
INTRAMUSCULAR | Status: AC
  Filled 2016-12-05: qty 2

## 2016-12-05 MED ORDER — HEPARIN SODIUM (PORCINE) 1000 UNIT/ML IJ SOLN
INTRAMUSCULAR | Status: DC | PRN
Start: 2016-12-05 — End: 2016-12-05
  Administered 2016-12-05 (×2): 5000 [IU] via INTRAVENOUS
  Administered 2016-12-05: 2000 [IU] via INTRAVENOUS

## 2016-12-05 MED ORDER — HEPARIN (PORCINE) IN NACL 2-0.9 UNIT/ML-% IJ SOLN
INTRAMUSCULAR | Status: AC
  Filled 2016-12-05: qty 1000

## 2016-12-05 MED ORDER — ASPIRIN 81 MG PO CHEW: 81.0000 mg | CHEWABLE_TABLET | ORAL | Status: DC

## 2016-12-05 MED ORDER — LIDOCAINE HCL (PF) 1 % IJ SOLN
INTRAMUSCULAR | Status: AC
  Filled 2016-12-05: qty 30

## 2016-12-05 MED ORDER — SODIUM CHLORIDE 0.9% FLUSH: 3.0000 mL | INTRAVENOUS | Status: DC | PRN

## 2016-12-05 MED ORDER — VERAPAMIL HCL 2.5 MG/ML IV SOLN
INTRAVENOUS | Status: DC | PRN
  Administered 2016-12-05: 10 mL via INTRA_ARTERIAL

## 2016-12-05 MED ORDER — HEPARIN SODIUM (PORCINE) 1000 UNIT/ML IJ SOLN
INTRAMUSCULAR | Status: AC
  Filled 2016-12-05: qty 1

## 2016-12-05 MED ORDER — METFORMIN HCL 500 MG PO TABS: 1000.0000 mg | ORAL_TABLET | Freq: Two times a day (BID) | ORAL | 0 refills | Status: DC

## 2016-12-05 MED ORDER — SODIUM CHLORIDE 0.9 % IV SOLN: 250.0000 mL | INTRAVENOUS | Status: DC | PRN

## 2016-12-05 MED ORDER — MIDAZOLAM HCL 2 MG/2ML IJ SOLN
INTRAMUSCULAR | Status: DC | PRN
  Administered 2016-12-05: 2 mg via INTRAVENOUS
  Administered 2016-12-05: 1 mg via INTRAVENOUS

## 2016-12-05 SURGICAL SUPPLY — 14 items
CATH INFINITI 5 FR JL3.5 (CATHETERS) ×3 IMPLANT
CATH INFINITI JR4 5F (CATHETERS) ×3 IMPLANT
CATH LAUNCHER 6FR 3DRIGHT (CATHETERS) ×2 IMPLANT
CATH OPTICROSS 40MHZ (CATHETERS) ×3 IMPLANT
CATHETER LAUNCHER 6FR 3DRIGHT (CATHETERS) ×6
DEVICE RAD COMP TR BAND LRG (VASCULAR PRODUCTS) ×3 IMPLANT
GLIDESHEATH SLEND SS 6F .021 (SHEATH) ×3 IMPLANT
GUIDEWIRE INQWIRE 1.5J.035X260 (WIRE) ×1 IMPLANT
INQWIRE 1.5J .035X260CM (WIRE) ×3
KIT HEART LEFT (KITS) ×3 IMPLANT
PACK CARDIAC CATHETERIZATION (CUSTOM PROCEDURE TRAY) ×3 IMPLANT
TRANSDUCER W/STOPCOCK (MISCELLANEOUS) ×3 IMPLANT
TUBING CIL FLEX 10 FLL-RA (TUBING) ×3 IMPLANT
WIRE ASAHI PROWATER 180CM (WIRE) ×3 IMPLANT

## 2016-12-05 NOTE — Interval H&P Note (Signed)
Cath Lab Visit (complete for each Cath Lab visit)  Clinical Evaluation Leading to the Procedure:   ACS: No.  Non-ACS:    Anginal Classification: CCS III  Anti-ischemic medical therapy: Minimal Therapy (1 class of medications)  Non-Invasive Test Results: Intermediate-risk stress test findings: cardiac mortality 1-3%/year  Prior CABG: No previous CABG      History and Physical Interval Note:  12/05/2016 10:09 AM  [name]  has presented today for surgery, with the diagnosis of cp  The various methods of treatment have been discussed with the patient and family. After consideration of risks, benefits and other options for treatment, the patient has consented to  Procedure(s): Left Heart Cath and Coronary Angiography (N/A) as a surgical intervention .  The patient's history has been reviewed, patient examined, no change in status, stable for surgery.  I have reviewed the patient's chart and labs.  Questions were answered to the patient's satisfaction.     Larae Grooms

## 2016-12-05 NOTE — Discharge Instructions (Signed)

## 2016-12-05 NOTE — H&P (View-Only) (Signed)
Cardiology Office Note:    Date:  11/20/2016   ID:  [name], DOB 12-23-1945, MRN [id number]  PCP:  Lujean Amel, MD  Cardiologist:   Dr. Candee Furbish   Electrophysiologist:  n/a Urologist: Dr. Irine Seal  Referring MD: Lujean Amel, MD   Chief Complaint  Patient presents with  . Follow-up    chest pain, abnormal Myoview    History of Present Illness:    Andrew Williamson is a 70 y.o. male with a hx of HTN, DM2, HL, prostate cancer status post radical prostatectomy in 9/16.  He was recently seen by Dr. Candee Furbish and me for evaluation of chest pain.  This would occur while riding a stationary bike but seemed to resolve after he stopped eating before exercise.  ETT-Myoview was arranged.  This returned abnormal with possible inferolateral ischemia.  He returns to discuss his test results and to arrange cardiac catheterization.    He is here alone today. He continues to ride a stationary bike. He notes some discomfort in his chest while exercising. This is unchanged. He denies rest symptoms. He denies orthopnea, PND or edema. He denies significant dyspnea. He denies syncope.   Prior CV studies that were reviewed today include:    Echo 12/17:  EF 55-60, normal wall motion, grade 1 diastolic dysfunction  Myoview 12/17: EF 55, inf-lateral ischemia; intermediate risk  Past Medical History:  Diagnosis Date  . Allergic rhinitis   . 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 DJD (degenerative joint disease)    hips  . ED (erectile dysfunction)   . GERD (gastroesophageal reflux disease)   . Gynecomastia, male   . History of echocardiogram    Echo 12/17: EF 55-60, normal wall motion, grade 1 diastolic dysfunction  . History of nuclear stress test    Myoview 12/17: EF 55, inf-lateral ischemia; intermediate risk  . HLD (hyperlipidemia)    hx of ? elevated LFTs on statin Rx  . Hypertension   . Obesity   . Prostate cancer [facility])    s/p  radical prostatectomy  . Spinal stenosis     Past Surgical History:  Procedure Laterality Date  . COLONOSCOPY  06/2014   FU 10 years  . LAPAROSCOPIC CHOLECYSTECTOMY     2012  . ROBOT ASSISTED LAPAROSCOPIC RADICAL PROSTATECTOMY N/A 08/11/2015   Procedure: ROBOTIC ASSISTED LAPAROSCOPIC RADICAL PROSTATECTOMY;  Surgeon: Irine Seal, MD;  Location: [facility];  Service: Urology;  Laterality: N/A;    Current Medications: Current Meds  Medication Sig  . aspirin EC 81 MG tablet Take 81 mg by mouth daily.  Marland Kitchen gabapentin (NEURONTIN) 300 MG capsule Take 300 mg by mouth 3 (three) times daily.   Marland Kitchen lisinopril (PRINIVIL,ZESTRIL) 10 MG tablet Take 10 mg by mouth every morning.  . metFORMIN (GLUCOPHAGE) 500 MG tablet Take 2 tablets by mouth 2 (two) times daily.  . metronidazole (NORITATE) 1 % cream Apply 1 application topically daily as needed (rosacea flare up.).  Marland Kitchen Multiple Vitamins-Minerals (CENTRUM SILVER PO) Take 1 tablet by mouth daily.  . nitroGLYCERIN (NITROSTAT) 0.4 MG SL tablet Place 1 tablet (0.4 mg total) under the tongue every 5 (five) minutes as needed.  Marland Kitchen omeprazole (PRILOSEC) 40 MG capsule Take 1 capsule by mouth every morning.  . pravastatin (PRAVACHOL) 20 MG tablet Take 20 mg by mouth every morning.     Allergies:   Thimerosal   Social History   Social History  . Marital status: Widowed  Spouse name: N/A  . Number of children: N/A  . Years of education: N/A   Social History Main Topics  . Smoking status: Never Smoker  . Smokeless tobacco: Current User    Types: Chew  . Alcohol use Yes     Comment: rarely  . Drug use: No  . Sexual activity: Not Asked   Other Topics Concern  . 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   ROS:   Please see the history of present illness.    ROS All other systems reviewed and are negative.   EKGs/Labs/Other Test Reviewed:    EKG:  EKG  ordered today.  The ekg ordered today demonstrates NSR, HR 78, normal axis, QTC 430 ms   Recent Labs: No results found for requested labs within last 8760 hours.  Labs from PCP dated 07/26/16: Hemoglobin A1c 6.1, BUN 21, creatinine 1.03, K4.1, ALT 21  Recent Lipid Panel No results found for: CHOL, TRIG, HDL, CHOLHDL, VLDL, LDLCALC, LDLDIRECT Labs from PCP dated 01/26/16: TC 145, TG 151, HDL 36, LDL 79  Physical Exam:    VS:  BP 130/60   Pulse 78   Ht 5\' 9"  (1.753 m)   Wt 215 lb (97.5 kg)   BMI 31.75 kg/m     Wt Readings from Last 3 Encounters:  11/20/16 215 lb (97.5 kg)  11/13/16 210 lb (95.3 kg)  10/17/16 210 lb 12.8 oz (95.6 kg)     Physical Exam  Constitutional: He is oriented to person, place, and time. He appears well-developed and well-nourished. No distress.  HENT:  Head: Normocephalic and atraumatic.  Eyes: No scleral icterus.  Neck: No JVD present.  Cardiovascular: Normal rate, regular rhythm and normal heart sounds.   No murmur heard. Pulmonary/Chest: Effort normal. He has no wheezes. He has no rales.  Abdominal: Soft. There is no tenderness.  Musculoskeletal: He exhibits no edema.  Neurological: He is alert and oriented to person, place, and time.  Skin: Skin is warm and dry.  Psychiatric: He has a normal mood and affect.    ASSESSMENT:    1. Chest pain due to myocardial ischemia, unspecified ischemic chest pain type ([place])   2. Essential hypertension   3. Pure hypercholesterolemia   4. Type 2 diabetes mellitus without complication, without long-term current use of insulin (HCC)    PLAN:    In order of problems listed  above:  1. Chest pain - He does have a history of exertional chest discomfort. This seems to be with more extreme activities. Recent nuclear stress test indicates probable inferolateral ischemia. Risk factors for coronary artery disease including diabetes, hypertension and family history of CAD. I have reviewed his stress test with Dr. Marlou Porch. We have recommended proceeding with cardiac catheterization.  Risks and benefits of cardiac catheterization have been discussed with the patient.  These include bleeding, infection, kidney damage, stroke, heart attack, death.  The patient understands these risks and is willing to proceed. He would like to wait  until he comes back from a trip to [place] next week.  As his symptoms have been stable, he should be safe to travel.  He knows to go to the ED if his symptoms change.   -  Proceed with LHC with Dr. Irish Lack 12/26  -  Add Imdur 15 QD - he does not take PDE-5 inhibitors  2. HTN - BP stable.   3. HL -  Continue statin.  4. DM - Hold Metformin 24 hours before and 48 hours after his catheterization.    Medication Adjustments/Labs and Tests Ordered: Current medicines are reviewed at length with the patient today.  Concerns regarding medicines are outlined above.  Medication changes, Labs and Tests ordered today are outlined in the Patient Instructions noted below. Patient Instructions  Medication Instructions:  1. START IMDUR 15 MG DAILY (THIS WILL BE 1/2 TABLET OF THE 30 MG TABLET)  Labwork: TODAY BMET, CBC , PT/INR  Testing/Procedures: Your physician has requested that you have a cardiac catheterization. Cardiac catheterization is used to diagnose and/or treat various heart conditions. Doctors may recommend this procedure for a number of different reasons. The most common reason is to evaluate chest pain. Chest pain can be a symptom of coronary artery disease (CAD), and cardiac catheterization can show whether plaque is narrowing or blocking your  heart's arteries. This procedure is also used to evaluate the valves, as well as measure the blood flow and oxygen levels in different parts of your heart. For further information please visit HugeFiesta.[email or web address]. Please follow instruction sheet, as given.  Follow-Up: 12/21/16 @ 12:15 WITH Suzy Kugel, PAC SAME DAY DR. Marlou Porch IS THE OFFICE  Any Other Special Instructions Will Be Listed Below (If Applicable).  If you need a refill on your cardiac medications before your next appointment, please call your pharmacy.  Signed, [name], PA-C  11/20/2016 4:46 PM    [facility] [place], [place], [place]  [address] Phone: [phone]; Fax: [phone]

## 2016-12-06 ENCOUNTER — Encounter (HOSPITAL_COMMUNITY): Payer: Self-pay | Admitting: Interventional Cardiology

## 2016-12-06 MED FILL — Adenosine IV Soln 12 MG/4ML: INTRAVENOUS | Qty: 8 | Status: AC

## 2016-12-11 ENCOUNTER — Other Ambulatory Visit: Payer: Self-pay | Admitting: *Deleted

## 2016-12-11 ENCOUNTER — Encounter: Payer: Self-pay | Admitting: Cardiothoracic Surgery

## 2016-12-11 ENCOUNTER — Institutional Professional Consult (permissible substitution) (INDEPENDENT_AMBULATORY_CARE_PROVIDER_SITE_OTHER): Payer: Medicare Other | Admitting: Cardiothoracic Surgery

## 2016-12-11 VITALS — BP 115/68 | HR 90 | Resp 16 | Ht 69.0 in | Wt 205.0 lb

## 2016-12-11 DIAGNOSIS — I2510 Atherosclerotic heart disease of native coronary artery without angina pectoris: Secondary | ICD-10-CM

## 2016-12-11 NOTE — Patient Instructions (Signed)
Stop Lisinopril and Glucophage two days before surgery   Coronary Artery Bypass Grafting Coronary artery bypass grafting (CABG) is a procedure done to bypass or fix arteries of the heart (coronary arteries) that have become narrow or blocked. This narrowing is usually the result of plaque that has built up in the walls of the vessels. The coronary arteries supply the heart with the oxygen and nutrients it needs to pump blood to your body. In the CABG procedure, a section of blood vessel from another part of the body (usually the leg, arm, or chest wall) is removed and then inserted where it will allow blood to bypass the damaged part of the coronary artery.  LET [facility] CARE PROVIDER KNOW ABOUT:  Any allergies you have.   All medicines you are taking, including blood thinners, vitamins, herbs, eye drops, creams, and over-the-counter medicines.   Use of steroids (by mouth or creams).   Previous problems you or members of your family have had with the use of anesthetics.   Any blood disorders you have.   Previous surgeries you have had.   Medical conditions you have.  RISKS AND COMPLICATIONS Generally, this is a safe procedure. However, problems can occur and include:   Blood loss.   Stroke.   Infection.   Pain at the surgical site.   Heart attack during or after surgery.   Kidney failure.  BEFORE THE PROCEDURE  Take medicines only as directed by your health care provider. You may be asked to start new medicines and stop taking others. Do not stop medicines or adjust dosages on your own.  Do not eat or drink anything after midnight the night before the procedure or as directed by your health care provider. Ask your health care provider if it is okay to take a sip of water with any needed medicines. PROCEDURE The surgeon may use either an open technique or a minimally invasive technique for this surgery. Traditional open surgery   You will be given medicine  to make you sleep through the procedure (general anesthetic).  Once you are asleep, a cut (incision) will be made down the front of the chest through the breastbone (sternum). The sternum will be spread open so your heart can be seen.  You will then be placed on a heart-lung bypass machine. This machine will provide oxygen to your blood while the heart is undergoing surgery.  Your heart will then be temporarily stopped so that the surgeon can do the next steps.  A section of vein will likely be taken from your leg and used to bypass the blocked arteries of your heart. Sometimes parts of an artery from inside your chest wall or from your arm will be used, either alone or in combination with leg veins.  When the bypasses are done, you will be taken off the machine.  Your heart will be restarted and will take over again normally.  The sac around the heart will be closed.  Your chest will then be closed with stitches or staples.  Tubes will remain in your chest and will be connected to a suction device in order to help drain fluid and reinflate the lungs.  AFTER THE PROCEDURE  You will be taken to a recovery area to be monitored.  You may wake up with a tube in your throat to help your breathing. You may be connected to a breathing machine. You will not be able to talk while the tube is in place. The tube  will be taken out as soon as it is safe to do so.  You will be groggy and may have some pain. You will be given pain medicine to help control the pain. This information is not intended to replace advice given to you by your health care provider. Make sure you discuss any questions you have with your health care provider. Document Released: 09/05/2005 Document Revised: 12/17/2014 Document Reviewed: 05/05/2013 Elsevier Interactive Patient Education  2017 [employer]  Coronary Artery Bypass Grafting, Care After Refer to this sheet in the next few weeks. These instructions provide you  with information on caring for yourself after your procedure. Your health care provider may also give you more specific instructions. Your treatment has been planned according to current medical practices, but problems sometimes occur. Call your health care provider if you have any problems or questions after your procedure. WHAT TO EXPECT AFTER THE PROCEDURE Recovery from surgery will be different for everyone. Some people feel well after 3 or 4 weeks, while for others it takes longer. After your procedure, it is typical to have the following:  Nausea and a lack of appetite.   Constipation.  Weakness and fatigue.   Depression or irritability.   Pain or discomfort at your incision site. HOME CARE INSTRUCTIONS  Take medicines only as directed by your health care provider. Do not stop taking medicines or start any new medicines without first checking with your health care provider.  Take your pulse as directed by your health care provider.  Perform deep breathing as directed by your health care provider. If you were given a device called an incentive spirometer, use it to practice deep breathing several times a day. Support your chest with a pillow or your arms when you take deep breaths or cough.  Keep incision areas clean, dry, and protected. Remove or change any bandages (dressings) only as directed by your health care provider. You may have skin adhesive strips over the incision areas. Do not take the strips off. They will fall off on their own.  Check incision areas daily for any swelling, redness, or drainage.  If incisions were made in your legs, do the following:  Avoid crossing your legs.   Avoid sitting for long periods of time. Change positions every 30 minutes.   Elevate your legs when you are sitting.  Wear compression stockings as directed by your health care provider. These stockings help keep blood clots from forming in your legs.  Take showers once your health  care provider approves. Until then, only take sponge baths. Pat incisions dry. Do not rub incisions with a washcloth or towel. Do not take baths, swim, or use a hot tub until your health care provider approves.  Eat foods that are high in fiber, such as raw fruits and vegetables, whole grains, beans, and nuts. Meats should be lean cut. Avoid canned, processed, and fried foods.  Drink enough fluid to keep your urine clear or pale yellow.  Weigh yourself every day. This helps identify if you are retaining fluid that may make your heart and lungs work harder.  Rest and limit activity as directed by your health care provider. You may be instructed to:  Stop any activity at once if you have chest pain, shortness of breath, irregular heartbeats, or dizziness. Get help right away if you have any of these symptoms.  Move around frequently for short periods or take short walks as directed by your health care provider. Increase your activities gradually.  You may need physical therapy or cardiac rehabilitation to help strengthen your muscles and build your endurance.  Avoid lifting, pushing, or pulling anything heavier than 10 lb (4.5 kg) for at least 6 weeks after surgery.  Do not drive until your health care provider approves.  Ask your health care provider when you may return to work.  Ask your health care provider when you may resume sexual activity.  Keep all follow-up visits as directed by your health care provider. This is important. SEEK MEDICAL CARE IF:  You have swelling, redness, increasing pain, or drainage at the site of an incision.  You have a fever.  You have swelling in your ankles or legs.  You have pain in your legs.   You gain 2 or more pounds (0.9 kg) a day.  You are nauseous or vomit.  You have diarrhea. SEEK IMMEDIATE MEDICAL CARE IF:  You have chest pain that goes to your jaw or arms.  You have shortness of breath.   You have a fast or irregular heartbeat.    You notice a "clicking" in your breastbone (sternum) when you move.   You have numbness or weakness in your arms or legs.  You feel dizzy or light-headed.  MAKE SURE YOU:  Understand these instructions.  Will watch your condition.  Will get help right away if you are not doing well or get worse. This information is not intended to replace advice given to you by your health care provider. Make sure you discuss any questions you have with your health care provider. Document Released: 06/15/2005 Document Revised: 12/17/2014 Document Reviewed: 05/05/2013 [employer]vier Interactive Patient Education  2017 [employer].

## 2016-12-11 NOTE — Progress Notes (Signed)
[address]       [place] [address]             [phone]                    Shannan H Dannenberg [place] Medical Record [id number] Date of Birth: 03/25/1946  Referring: Jettie Booze, MD  Primary cardiologist: Dr Luther Parody Primary Care: Lujean Amel, MD  Chief Complaint:    Chief Complaint  Patient presents with  . Coronary Artery Disease    CATH 12/05/16, ECHO 11/13/16    History of Present Illness:    Andrew Williamson 71 y.o. male is seen in the office  today for coronary artery disease. He was recently seen by Dr. Candee Furbish  for evaluation of chest pain.  The chest pain occurred  while riding a stationary bike but resolved   after he stopped eating before exercise.  ETT-Myoview was done, abnormal with possible inferolateral ischemia. Recent cardiac cath was done.     He  denies rest symptoms. He denies orthopnea, PND or edema. He denies significant dyspnea. He denies syncope.   He has no previous cardiac history. He has hx of HTN, DM2, HL, prostate cancer status post radical prostatectomy in 9/16  Patient has dx of spinal stenosis, this has slowed him down physically for past year, He has had back  injections x2 and started riding stational bike when symptoms started   Current Activity/ Functional Status:  Patient is independent with mobility/ambulation, transfers, ADL's, IADL's.   Zubrod Score: At the time of surgery this patient's most appropriate activity status/level should be described as: []     0    Normal activity, no symptoms [x]     1    Restricted in physical strenuous activity but ambulatory, able to do out light work []     2    Ambulatory and capable of self care, unable to do work activities, up and about               >50 % of waking hours                              []     3    Only limited self care, in bed greater than 50% of waking hours []     4    Completely disabled, no self care, confined to bed or chair []     5     Moribund   Past Medical History:  Diagnosis Date  . Allergic rhinitis   . 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 DJD (degenerative joint disease)    hips  . ED (erectile dysfunction)   . GERD (gastroesophageal reflux disease)   . Gynecomastia, male   . History of echocardiogram    Echo 12/17: EF 55-60, normal wall motion, grade 1 diastolic dysfunction  . History of nuclear stress test    Myoview 12/17: EF 55, inf-lateral ischemia; intermediate risk  . HLD (hyperlipidemia)    hx of ? elevated LFTs on statin Rx  . Hypertension   . Obesity   . Prostate cancer [facility])    s/p radical prostatectomy  . Spinal stenosis     Past Surgical History:  Procedure Laterality Date  . CARDIAC CATHETERIZATION N/A 12/05/2016   Procedure: Left Heart Cath and Coronary Angiography;  Surgeon: Jettie Booze, MD;  Location: [facility]  INVASIVE CV LAB;  Service: Cardiovascular;  Laterality: N/A;  . CARDIAC CATHETERIZATION N/A 12/05/2016   Procedure: Intravascular Ultrasound/IVUS;  Surgeon: Jettie Booze, MD;  Location: [facility] CV LAB;  Service: Cardiovascular;  Laterality: N/A;  . COLONOSCOPY  06/2014   FU 10 years  . LAPAROSCOPIC CHOLECYSTECTOMY     2012  . ROBOT ASSISTED LAPAROSCOPIC RADICAL PROSTATECTOMY N/A 08/11/2015   Procedure: ROBOTIC ASSISTED LAPAROSCOPIC RADICAL PROSTATECTOMY;  Surgeon: Irine Seal, MD;  Location: [facility] ORS;  Service: Urology;  Laterality: N/A;    Family History  Problem Relation Age of Onset  . Heart attack Mother 39  . Heart failure Father 68    cardiac arrest during surgery >> CHF  . Peripheral Artery Disease Father   . Heart attack Maternal Uncle     Social History   Social History  . Marital status: Widowed    Spouse name: N/A  . Number of children: N/A  . Years of education: N/A   Occupational History  . Not on file.   Social History Main Topics  . Smoking status: Never Smoker  . Smokeless tobacco: Current User     Types: Chew  . Alcohol use Yes     Comment: rarely  . Drug use: No  . Sexual activity: Not on file   Other Topics Concern  . 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 Never Smoker  Smokeless Tobacco  . Current User  . Types: Chew    History  Alcohol Use  . Yes    Comment: rarely     Allergies  Allergen Reactions  . Thimerosal Other (See Comments)    Red eyes     Current Outpatient Prescriptions  Medication Sig Dispense Refill  . aspirin EC 81 MG tablet Take 81 mg by mouth every evening.     . gabapentin (NEURONTIN) 300 MG capsule Take 300 mg by mouth 3 (three) times daily.     . isosorbide mononitrate (IMDUR) 30 MG 24 hr tablet Take 0.5 tablets (15 mg total) by mouth daily. 90 tablet 3  . lisinopril (PRINIVIL,ZESTRIL) 10 MG tablet Take 10 mg by mouth every morning.  0  . metFORMIN (GLUCOPHAGE) 500 MG tablet Take 2 tablets (1,000 mg total) by mouth 2 (two) times daily.  0  . metronidazole (NORITATE) 1 % cream Apply 1 application topically daily as needed (rosacea flare up.).    Marland Kitchen Multiple Vitamins-Minerals (CENTRUM SILVER PO) Take 1 tablet by mouth daily.    . nitroGLYCERIN (NITROSTAT) 0.4 MG SL tablet Place 1 tablet (0.4 mg total) under the tongue every 5 (five) minutes as needed. 25 tablet 3  . omeprazole (PRILOSEC) 40 MG capsule Take 1 capsule by mouth every morning.  11  . pravastatin (PRAVACHOL) 20 MG tablet Take 20 mg by mouth every morning.  0   No current facility-administered medications for this visit.      Review of Systems:     Cardiac Review of Systems: Y or N  Chest Pain [ y with exercise   ]  Resting SOB [ n  ] Exertional SOB  [ n ]  Orthopnea [ n ]   Pedal Edema [ n  ]  Palpitations [ n ] Syncope  [ n ]     Presyncope [n   ]  General Review of Systems: [Y] = yes [  ]=no Constitional: recent weight change [n  ];  Wt loss over the last 3 months [   ] anorexia [  ]; fatigue Blue.Reese  ]; nausea [  ]; night sweats [  ]; fever [  ]; or chills [  ];          Dental: poor dentition[n  ]; Last Dentist visit:   Eye : blurred vision [n  ]; diplopia [   ]; vision changes [  ];  Amaurosis fugax[n  ]; Resp: cough [n  ];  wheezing[  n];  hemoptysis[n  ]; shortness of breath[n  ]; paroxysmal nocturnal dyspnea[  ]; dyspnea on exertion[  ]; or orthopnea[  ];  GI:  gallstones[  ], vomiting[  ];  dysphagia[  ]; melena[  ];  hematochezia [  ]; heartburn[  ];   Hx of  Colonoscopy[y  ]; GU: kidney stones [  n]; hematuria[  ];   dysuria [n  ];  nocturia[  ];  history of     obstruction [  ]; urinary frequency Blue.Reese  ]             Skin: rash, swelling[  ];, hair loss[  ];  peripheral edema[  ];  or itching[  ]; Musculosketetal: myalgias[n  ];  joint swelling[nn  ];  joint erythema[  ];  joint pain[  ];  back pain[  ];  Heme/Lymph: bruising[  n];  bleeding[  ];  anemia[n  ];  Neuro: TIA[  ];  headaches[  ];  stroke[  ];  vertigo[  ];  seizures[  ];   paresthesias[  ];  difficulty walking[  ];  Psych:depression[  ]; anxiety[  ];  Endocrine: diabetes[  ];  thyroid dysfunction[  ];  Immunizations: Flu up to date [ y ]; Pneumococcal up to date Blue.Reese  ];  Other:  Physical Exam: BP 115/68 (BP Location: Right Arm, Patient Position: Sitting, Cuff Size: Large)   Pulse 90   Resp 16   Ht 5\' 9"  (1.753 m)   Wt 205 lb (93 kg)   SpO2 95%   BMI 30.27 kg/m   PHYSICAL EXAMINATION: General appearance: alert, cooperative, appears older than stated age and no distress Head: Normocephalic, without obvious abnormality, atraumatic Neck: no adenopathy, no carotid bruit, no JVD, supple, symmetrical, trachea midline and thyroid not enlarged, symmetric, no tenderness/mass/nodules Lymph nodes: Cervical, supraclavicular, and axillary nodes normal. Resp:  clear to auscultation bilaterally Back: symmetric, mild curvature. ROM normal. No CVA tenderness. Cardio: regular rate and rhythm, S1, S2 normal, no murmur, click, rub or gallop GI: soft, non-tender; bowel sounds normal; no masses,  no organomegaly Extremities: extremities normal, atraumatic, no cyanosis or edema and Homans sign is negative, no sign of DVT Neurologic: Grossly normal  Diagnostic Studies & Laboratory data:     Recent Radiology Findings:  No chest xray   Recent Lab Findings: Lab Results  Component Value Date   WBC 8.0 11/20/2016   HGB 13.3 11/20/2016   HCT 39.2 11/20/2016   PLT 230 11/20/2016   GLUCOSE 120 (H) 11/20/2016   ALT 149 (H) 08/02/2009   AST 58 (H) 08/02/2009   NA 144 11/20/2016   K 4.5 11/20/2016   CL 105 11/20/2016   CREATININE 1.22 (H) 11/20/2016   BUN 22 11/20/2016   CO2 26 11/20/2016  INR 1.0 11/20/2016   Chronic Kidney Disease   Stage I     GFR >90  Stage II    GFR 60-89  Stage IIIA GFR 45-59  Stage IIIB GFR 30-44  Stage IV   GFR 15-29  Stage V    GFR  <15  Lab Results  Component Value Date   CREATININE 1.22 (H) 11/20/2016   Mild cr elevation 0 .9 last year ,  [id number] 1.22  CATH:  The left ventricular systolic function is normal.  LV end diastolic pressure is normal.  The left ventricular ejection fraction is 55-65% by visual estimate.  There is no aortic valve stenosis.  Ost LM to LM lesion, 50 %stenosed. Significant by IVUS which showed ostial Left main to have a luminal area of 4.5 mm2. Significant catheter dampening with 6 Fr Guide.  Proximal Cx lesion, 95 %stenosed just after ramus takeoff.  Ost LAD to Prox LAD lesion, 40 %stenosed. Moderate disease in ostial ramus.  Mid Cx lesion, 95 %stenosed.  Flowsheet Row Most Recent Value  AO Systolic Pressure [id number] mmHg  AO Diastolic Pressure 58 mmHg  AO Mean 82 mmHg  LV Systolic Pressure 123456 mmHg  LV Diastolic Pressure 5 mmHg  LV EDP 12 mmHg  Arterial Occlusion Pressure  Extended Systolic Pressure [id number] mmHg  Arterial Occlusion Pressure Extended Diastolic Pressure 60 mmHg  Arterial Occlusion Pressure Extended Mean Pressure 85 mmHg  Left Ventricular Apex Extended Systolic Pressure [id number] mmHg  Left Ventricular Apex Extended Diastolic Pressure 5 mmHg  Left Ventricular Apex Extended EDP Pressure 15 mmHg      Given multivessel CAD including bifurcation disease at the left main, will plan for cardiac surgery consultation.  He does not have angina with regular household activities.  He had minimal angina with the stress test, now on Imdur.  He lost his SL NTG so I called in another prescription.  Plan for outpatient surgical consultation.    ECHO:  [facility]                        [address], [place] [address]                            [phone]  ------------------------------------------------------------------- Transthoracic Echocardiography  Patient:    Videl, Nager MR #:       [id number] Study Date: 11/13/2016 Gender:     M Age:        87 Height:     175.3 cm Weight:     95.6 kg BSA:        2.19 m^2 Pt. Status: Room:   ATTENDING    Alto Denver, Scott T  REFERRING    [name] T  PERFORMING   [facility], Outpatient  SONOGRAPHER  [facility], RDCS  cc:  ------------------------------------------------------------------- LV EF: 55% -   60%  ------------------------------------------------------------------- Indications:      Chest Pain (R07.9).  ------------------------------------------------------------------- History:   Risk factors:  Family history of coronary artery disease. Hypertension. Diabetes mellitus. Dyslipidemia.  ------------------------------------------------------------------- Study Conclusions  - Left ventricle: The cavity size was normal. Wall thickness was   normal. Systolic function was normal. The estimated ejection   fraction  was in the range of 55% to  60%. Wall motion was normal;   there were no regional wall motion abnormalities. Doppler   parameters are consistent with abnormal left ventricular   relaxation (grade 1 diastolic dysfunction).  Impressions:  - Normal LV systolic function; grade 1 diastolic dysfunction.  ------------------------------------------------------------------- Study data:  No prior study was available for comparison.  Study status:  Routine.  Procedure:  Transthoracic echocardiography. Image quality was adequate.          Transthoracic echocardiography.  M-mode, complete 2D, spectral Doppler, and color Doppler.  Birthdate:  Patient birthdate: 06/22/1946.  Age:  Patient is 71 yr old.  Sex:  Gender: male.    BMI: 31.1 kg/m^2.  Blood pressure:     130/60  Patient status:  Outpatient.  Study date: Study date: 11/13/2016. Study time: 08:56 AM.  Location:  [facility]  -------------------------------------------------------------------  ------------------------------------------------------------------- Left ventricle:  The cavity size was normal. Wall thickness was normal. Systolic function was normal. The estimated ejection fraction was in the range of 55% to 60%. Wall motion was normal; there were no regional wall motion abnormalities. Doppler parameters are consistent with abnormal left ventricular relaxation (grade 1 diastolic dysfunction).  ------------------------------------------------------------------- Aortic valve:   Trileaflet; mildly thickened leaflets. Mobility was not restricted.  Doppler:  Transvalvular velocity was within the normal range. There was no stenosis. There was no regurgitation.   ------------------------------------------------------------------- Aorta:  Aortic root: The aortic root was normal in size.  ------------------------------------------------------------------- Mitral valve:   Structurally normal valve.   Mobility was  not restricted.  Doppler:  Transvalvular velocity was within the normal range. There was no evidence for stenosis. There was no regurgitation.    Peak gradient (D): 2 mm Hg.  ------------------------------------------------------------------- Left atrium:  The atrium was normal in size.  ------------------------------------------------------------------- Right ventricle:  The cavity size was normal. Systolic function was normal.  ------------------------------------------------------------------- Pulmonic valve:    Doppler:  Transvalvular velocity was within the normal range. There was no evidence for stenosis.  ------------------------------------------------------------------- Tricuspid valve:   Structurally normal valve.    Doppler: Transvalvular velocity was within the normal range. There was trivial regurgitation.  ------------------------------------------------------------------- Right atrium:  The atrium was normal in size.  ------------------------------------------------------------------- Pericardium:  There was no pericardial effusion.  ------------------------------------------------------------------- Systemic veins: Inferior vena cava: The vessel was normal in size. The respirophasic diameter changes were in the normal range (= 50%), consistent with normal central venous pressure. Diameter: 9.61 mm.  ------------------------------------------------------------------- Measurements   IVC                                   Value        Reference  ID                                    9.61  mm     ---------    Left ventricle                        Value        Reference  LV ID, ED, PLAX chordal        (L)    37.7  mm     43 - 52  LV ID, ES, PLAX chordal               23.3  mm  23 - 38  LV fx shortening, PLAX chordal        38    %      >=29  LV PW thickness, ED                   9.35  mm     ---------  IVS/LV PW ratio, ED                   1             <=1.3  Stroke volume, 2D                     98    ml     ---------  Stroke volume/bsa, 2D                 45    ml/m^2 ---------  LV e&', lateral                        9.32  cm/s   ---------  LV E/e&', lateral                      8.27         ---------  LV e&', medial                         5.98  cm/s   ---------  LV E/e&', medial                       12.89        ---------  LV e&', average                        7.65  cm/s   ---------  LV E/e&', average                      10.08        ---------  Longitudinal strain, TDI              13    %      ---------    Ventricular septum                    Value        Reference  IVS thickness, ED                     9.36  mm     ---------    LVOT                                  Value        Reference  LVOT ID, S                            24    mm     ---------  LVOT area                             4.52  cm^2   ---------  LVOT peak velocity, S                 92.4  cm/s   ---------  LVOT mean velocity, S                 60.8  cm/s   ---------  LVOT VTI, S                           21.6  cm     ---------    Aorta                                 Value        Reference  Aortic root ID, ED                    34    mm     ---------  Ascending aorta ID, A-P, S            34    mm     ---------    Left atrium                           Value        Reference  LA ID, A-P, ES                        30    mm     ---------  LA ID/bsa, A-P                        1.37  cm/m^2 <=2.2  LA volume, S                          35.8  ml     ---------  LA volume/bsa, S                      16.4  ml/m^2 ---------  LA volume, ES, 1-p A4C                34.1  ml     ---------  LA volume/bsa, ES, 1-p A4C            15.6  ml/m^2 ---------  LA volume, ES, 1-p A2C                35.6  ml     ---------  LA volume/bsa, ES, 1-p A2C            16.3  ml/m^2 ---------    Mitral valve                          Value        Reference  Mitral E-wave peak velocity            77.1  cm/s   ---------  Mitral A-wave peak velocity           94.1  cm/s   ---------  Mitral deceleration time       (H)    282   ms     150 - 230  Mitral peak gradient, D               2     mm Hg  ---------  Mitral E/A ratio, peak  0.8          ---------    Right ventricle                       Value        Reference  TAPSE                                 20.2  mm     ---------  RV s&', lateral, S                     13.7  cm/s   ---------  Legend: (L)  and  (H)  mark values outside specified reference range.  ------------------------------------------------------------------- Prepared and Electronically Authenticated by  Kirk Ruths  Myoview: Study Highlights    Nuclear stress EF: 55%.  Blood pressure demonstrated a normal response to exercise.  There was 44mm of J point depression with upsloping ST segments in the inferolateral leads  There is a small defect of moderate severity present in the basal inferolateral and mid inferolateral location. The defect is partially reversible. This is worrisome for ischemia.  This is an intermediate risk study.  The left ventricular ejection fraction is normal (55-65%).      Assessment / Plan:   CAD with exercise induced angina in patient with mild renal insufficiency, DM and post ive stress test. I have recommended proceeding with CABG as best treatment options with critical circumflex and intermediate cad and 50 % left main. Risks and options have been discussed in detail. Patient is agreeable with proceeding. Plan CABG January 9.      I  spent 40 minutes counseling the patient face to face and 50% or more the  time was spent in counseling and coordination of care. The total time spent in the appointment was 60 minutes.  [name] MD      [address] [place] [address] Office [phone]   Beeper [phone]  12/11/2016 9:04 PM

## 2016-12-14 NOTE — Pre-Procedure Instructions (Signed)
    Andrew Williamson  12/14/2016      [name], [place] [address] Phone: [phone] Fax: [phone]    Your procedure is scheduled on 12/18/16.  Report to [facility] Admitting at 630 A.M.  Call this number if you have problems the morning of surgery:  [phone]   Remember:  Do not eat food or drink liquids after midnight.  Take these medicines the morning of surgery with A SIP OF WATER ---tylenol,neurontin,imdur Do not wear lotions, powders, or perfumes, or deoderant.  Do not shave 48 hours prior to surgery.  Men may shave face and neck.  Do not bring valuables to the hospital.  [facility] is not responsible for any belongings or valuables.  Contacts, dentures or bridgework may not be worn into surgery.  Leave your suitcase in the car.  After surgery it may be brought to your room.  For patients admitted to the hospital, discharge time will be determined by your treatment team.  Patients discharged the day of surgery will not be allowed to drive home.   Name and phone number of your driver:    Special instructions: -- no nsaids   Please read over the following fact sheets that you were given. MRSA Information

## 2016-12-17 ENCOUNTER — Ambulatory Visit (HOSPITAL_COMMUNITY)
Admission: RE | Admit: 2016-12-17 | Discharge: 2016-12-17 | Disposition: A | Discharge status: Home / Self Care | Payer: Medicare Other | Source: Ambulatory Visit | Attending: Cardiothoracic Surgery | Admitting: Cardiothoracic Surgery

## 2016-12-17 ENCOUNTER — Ambulatory Visit (HOSPITAL_BASED_OUTPATIENT_CLINIC_OR_DEPARTMENT_OTHER)
Admission: RE | Admit: 2016-12-17 | Discharge: 2016-12-17 | Disposition: A | Discharge status: Home / Self Care | Payer: Medicare Other | Source: Ambulatory Visit | Attending: Cardiothoracic Surgery | Admitting: Cardiothoracic Surgery

## 2016-12-17 ENCOUNTER — Encounter (HOSPITAL_COMMUNITY): Payer: Self-pay

## 2016-12-17 ENCOUNTER — Encounter (HOSPITAL_COMMUNITY)
Admission: RE | Admit: 2016-12-17 | Discharge: 2016-12-17 | Disposition: A | Discharge status: Home / Self Care | Payer: Medicare Other | Source: Ambulatory Visit | Attending: Cardiothoracic Surgery | Admitting: Cardiothoracic Surgery

## 2016-12-17 DIAGNOSIS — R918 Other nonspecific abnormal finding of lung field: Secondary | ICD-10-CM | POA: Diagnosis not present

## 2016-12-17 DIAGNOSIS — N182 Chronic kidney disease, stage 2 (mild): Secondary | ICD-10-CM | POA: Diagnosis not present

## 2016-12-17 DIAGNOSIS — I2510 Atherosclerotic heart disease of native coronary artery without angina pectoris: Secondary | ICD-10-CM | POA: Diagnosis not present

## 2016-12-17 DIAGNOSIS — I25119 Atherosclerotic heart disease of native coronary artery with unspecified angina pectoris: Secondary | ICD-10-CM | POA: Diagnosis not present

## 2016-12-17 DIAGNOSIS — E1122 Type 2 diabetes mellitus with diabetic chronic kidney disease: Secondary | ICD-10-CM | POA: Diagnosis not present

## 2016-12-17 DIAGNOSIS — E8770 Fluid overload, unspecified: Secondary | ICD-10-CM | POA: Diagnosis not present

## 2016-12-17 DIAGNOSIS — Z8249 Family history of ischemic heart disease and other diseases of the circulatory system: Secondary | ICD-10-CM | POA: Diagnosis not present

## 2016-12-17 DIAGNOSIS — E119 Type 2 diabetes mellitus without complications: Secondary | ICD-10-CM

## 2016-12-17 DIAGNOSIS — I4891 Unspecified atrial fibrillation: Secondary | ICD-10-CM | POA: Diagnosis not present

## 2016-12-17 DIAGNOSIS — I10 Essential (primary) hypertension: Secondary | ICD-10-CM | POA: Insufficient documentation

## 2016-12-17 DIAGNOSIS — Z9079 Acquired absence of other genital organ(s): Secondary | ICD-10-CM | POA: Diagnosis not present

## 2016-12-17 DIAGNOSIS — F17220 Nicotine dependence, chewing tobacco, uncomplicated: Secondary | ICD-10-CM | POA: Diagnosis not present

## 2016-12-17 DIAGNOSIS — D62 Acute posthemorrhagic anemia: Secondary | ICD-10-CM | POA: Diagnosis not present

## 2016-12-17 DIAGNOSIS — I129 Hypertensive chronic kidney disease with stage 1 through stage 4 chronic kidney disease, or unspecified chronic kidney disease: Secondary | ICD-10-CM | POA: Diagnosis not present

## 2016-12-17 DIAGNOSIS — E785 Hyperlipidemia, unspecified: Secondary | ICD-10-CM | POA: Diagnosis not present

## 2016-12-17 DIAGNOSIS — Z8546 Personal history of malignant neoplasm of prostate: Secondary | ICD-10-CM | POA: Diagnosis not present

## 2016-12-17 LAB — CBC
HCT: 38.4 % — ABNORMAL LOW (ref 39.0–52.0)
Hemoglobin: 13.2 g/dL (ref 13.0–17.0)
MCH: 31.3 pg (ref 26.0–34.0)
MCHC: 34.4 g/dL (ref 30.0–36.0)
MCV: 91 fL (ref 78.0–100.0)
Platelets: 206 10*3/uL (ref 150–400)
RBC: 4.22 MIL/uL (ref 4.22–5.81)
RDW: 12.6 % (ref 11.5–15.5)
WBC: 9.6 10*3/uL (ref 4.0–10.5)

## 2016-12-17 LAB — URINALYSIS, ROUTINE W REFLEX MICROSCOPIC
Bilirubin Urine: NEGATIVE
Glucose, UA: NEGATIVE mg/dL
Hgb urine dipstick: NEGATIVE
Ketones, ur: NEGATIVE mg/dL
Leukocytes, UA: NEGATIVE
Nitrite: NEGATIVE
Protein, ur: NEGATIVE mg/dL
Specific Gravity, Urine: 1.023 (ref 1.005–1.030)
pH: 5 (ref 5.0–8.0)

## 2016-12-17 LAB — PULMONARY FUNCTION TEST
DL/VA % pred: 72 %
DL/VA: 3.23 ml/min/mmHg/L
DLCO unc % pred: 62 %
DLCO unc: 18.6 ml/min/mmHg
FEF 25-75 Post: 5.26 L/sec
FEF 25-75 Pre: 5.37 L/sec
FEF2575-%Change-Post: -1 %
FEF2575-%Pred-Post: 232 %
FEF2575-%Pred-Pre: 237 %
FEV1-%Change-Post: 2 %
FEV1-%Pred-Post: 118 %
FEV1-%Pred-Pre: 115 %
FEV1-Post: 3.53 L
FEV1-Pre: 3.44 L
FEV1FVC-%Change-Post: 1 %
FEV1FVC-%Pred-Pre: 119 %
FEV6-%Change-Post: 1 %
FEV6-%Pred-Post: 104 %
FEV6-%Pred-Pre: 102 %
FEV6-Post: 3.99 L
FEV6-Pre: 3.92 L
FEV6FVC-%Change-Post: 0 %
FEV6FVC-%Pred-Post: 106 %
FEV6FVC-%Pred-Pre: 106 %
FVC-%Change-Post: 1 %
FVC-%Pred-Post: 98 %
FVC-%Pred-Pre: 97 %
FVC-Post: 4 L
FVC-Pre: 3.96 L
Post FEV1/FVC ratio: 88 %
Post FEV6/FVC ratio: 100 %
Pre FEV1/FVC ratio: 87 %
Pre FEV6/FVC Ratio: 100 %
RV % pred: 88 %
RV: 2.09 L
TLC % pred: 97 %
TLC: 6.46 L

## 2016-12-17 LAB — BLOOD GAS, ARTERIAL
Acid-Base Excess: 0.7 mmol/L (ref 0.0–2.0)
Bicarbonate: 24.7 mmol/L (ref 20.0–28.0)
Drawn by: 470591
FIO2: 0.21
O2 Saturation: 95.2 %
Patient temperature: 98.6
pCO2 arterial: 39.5 mmHg (ref 32.0–48.0)
pH, Arterial: 7.413 (ref 7.350–7.450)
pO2, Arterial: 76.9 mmHg — ABNORMAL LOW (ref 83.0–108.0)

## 2016-12-17 LAB — VAS US DOPPLER PRE CABG
LEFT ECA DIAS: -19 cm/s
LEFT VERTEBRAL DIAS: -17 cm/s
Left CCA dist dias: -27 cm/s
Left CCA dist sys: -141 cm/s
Left CCA prox dias: 27 cm/s
Left CCA prox sys: 132 cm/s
Left ICA dist dias: -24 cm/s
Left ICA dist sys: -86 cm/s
Left ICA prox dias: -23 cm/s
Left ICA prox sys: -101 cm/s
RIGHT ECA DIAS: -17 cm/s
RIGHT VERTEBRAL DIAS: -11 cm/s
Right CCA prox dias: 22 cm/s
Right CCA prox sys: 125 cm/s
Right cca dist sys: -93 cm/s

## 2016-12-17 LAB — COMPREHENSIVE METABOLIC PANEL
ALT: 19 U/L (ref 17–63)
AST: 20 U/L (ref 15–41)
Albumin: 4.2 g/dL (ref 3.5–5.0)
Alkaline Phosphatase: 60 U/L (ref 38–126)
Anion gap: 10 (ref 5–15)
BUN: 27 mg/dL — ABNORMAL HIGH (ref 6–20)
CO2: 25 mmol/L (ref 22–32)
Calcium: 9.4 mg/dL (ref 8.9–10.3)
Chloride: 106 mmol/L (ref 101–111)
Creatinine, Ser: 1.27 mg/dL — ABNORMAL HIGH (ref 0.61–1.24)
GFR calc Af Amer: >60 mL/min (ref >60)
GFR calc non Af Amer: 56 mL/min — ABNORMAL LOW (ref >60)
Glucose, Bld: 201 mg/dL — ABNORMAL HIGH (ref 65–99)
Potassium: 3.8 mmol/L (ref 3.5–5.1)
Sodium: 141 mmol/L (ref 135–145)
Total Bilirubin: 0.4 mg/dL (ref 0.3–1.2)
Total Protein: 6.7 g/dL (ref 6.5–8.1)

## 2016-12-17 LAB — PROTIME-INR
INR: 1.01
Prothrombin Time: 13.3 seconds (ref 11.4–15.2)

## 2016-12-17 LAB — SURGICAL PCR SCREEN
MRSA, PCR: NEGATIVE
Staphylococcus aureus: NEGATIVE

## 2016-12-17 LAB — GLUCOSE, CAPILLARY: Glucose-Capillary: 187 mg/dL — ABNORMAL HIGH (ref 65–99)

## 2016-12-17 LAB — APTT: aPTT: 26 seconds (ref 24–36)

## 2016-12-17 LAB — ABO/RH: ABO/RH(D): O POS

## 2016-12-17 MED ORDER — ALBUTEROL SULFATE (2.5 MG/3ML) 0.083% IN NEBU
2.5000 mg | INHALATION_SOLUTION | Freq: Once | RESPIRATORY_TRACT | Status: AC
  Administered 2016-12-17: 2.5 mg via RESPIRATORY_TRACT

## 2016-12-17 NOTE — Progress Notes (Signed)
Pre-op Cardiac Surgery  Carotid Findings:  1-39% ICA plaquing. Vertebral artery flow is antegrade.  Upper Extremity Right Left  Brachial Pressures 165T 156T  Radial Waveforms T T  Ulnar Waveforms T T  Palmar Arch (Allen's Test) WNL Doppler signal remains normal with radial compression and diminishes 50% with ulnar compression   Findings:      Lower  Extremity Right Left  Dorsalis Pedis    Anterior Tibial 159T 148T  Posterior Tibial 173T 179T  Ankle/Brachial Indices 1.05 1.08    Findings:  ABIs within normal limits.

## 2016-12-18 ENCOUNTER — Encounter (HOSPITAL_COMMUNITY): Payer: Self-pay | Admitting: Certified Registered Nurse Anesthetist

## 2016-12-18 LAB — HEMOGLOBIN A1C
Hgb A1c MFr Bld: 6.4 % — ABNORMAL HIGH (ref 4.8–5.6)
Mean Plasma Glucose: 137 mg/dL

## 2016-12-18 MED ORDER — DEXTROSE 5 % IV SOLN
1.5000 g | INTRAVENOUS | Status: AC
  Administered 2016-12-19: .75 g via INTRAVENOUS
  Administered 2016-12-19: 1.5 g via INTRAVENOUS
  Filled 2016-12-18: qty 1.5

## 2016-12-18 MED ORDER — METOPROLOL TARTRATE 12.5 MG HALF TABLET
12.5000 mg | ORAL_TABLET | Freq: Once | ORAL | Status: AC
  Administered 2016-12-19: 12.5 mg via ORAL
  Filled 2016-12-18: qty 1

## 2016-12-18 MED ORDER — DEXTROSE 5 % IV SOLN
750.0000 mg | INTRAVENOUS | Status: DC
  Filled 2016-12-18: qty 750

## 2016-12-18 MED ORDER — TRANEXAMIC ACID (OHS) PUMP PRIME SOLUTION
2.0000 mg/kg | INTRAVENOUS | Status: DC
  Filled 2016-12-18: qty 1.87

## 2016-12-18 MED ORDER — PLASMA-LYTE 148 IV SOLN
INTRAVENOUS | Status: AC
  Administered 2016-12-19: 400 mL
  Filled 2016-12-18: qty 2.5

## 2016-12-18 MED ORDER — MAGNESIUM SULFATE 50 % IJ SOLN
40.0000 meq | INTRAMUSCULAR | Status: DC
  Filled 2016-12-18: qty 10

## 2016-12-18 MED ORDER — POTASSIUM CHLORIDE 2 MEQ/ML IV SOLN
80.0000 meq | INTRAVENOUS | Status: DC
  Filled 2016-12-18: qty 40

## 2016-12-18 MED ORDER — DEXMEDETOMIDINE HCL IN NACL 400 MCG/100ML IV SOLN
0.1000 ug/kg/h | INTRAVENOUS | Status: AC
  Administered 2016-12-19: .3 ug/kg/h via INTRAVENOUS
  Filled 2016-12-18: qty 100

## 2016-12-18 MED ORDER — TRANEXAMIC ACID (OHS) BOLUS VIA INFUSION
15.0000 mg/kg | INTRAVENOUS | Status: AC
  Administered 2016-12-19: 1401 mg via INTRAVENOUS
  Filled 2016-12-18: qty 1401

## 2016-12-18 MED ORDER — DOPAMINE-DEXTROSE 3.2-5 MG/ML-% IV SOLN
0.0000 ug/kg/min | INTRAVENOUS | Status: DC
  Filled 2016-12-18: qty 250

## 2016-12-18 MED ORDER — SODIUM CHLORIDE 0.9 % IV SOLN
INTRAVENOUS | Status: AC
  Administered 2016-12-19: 1 [IU]/h via INTRAVENOUS
  Administered 2016-12-19: 1.8 [IU]/h via INTRAVENOUS
  Filled 2016-12-18: qty 2.5

## 2016-12-18 MED ORDER — CHLORHEXIDINE GLUCONATE 0.12 % MT SOLN
15.0000 mL | Freq: Once | OROMUCOSAL | Status: AC
  Administered 2016-12-19: 15 mL via OROMUCOSAL
  Filled 2016-12-18: qty 15

## 2016-12-18 MED ORDER — PHENYLEPHRINE HCL 10 MG/ML IJ SOLN
30.0000 ug/min | INTRAVENOUS | Status: AC
  Administered 2016-12-19: 10 ug/min via INTRAVENOUS
  Filled 2016-12-18: qty 2

## 2016-12-18 MED ORDER — TRANEXAMIC ACID 1000 MG/10ML IV SOLN
1.5000 mg/kg/h | INTRAVENOUS | Status: AC
  Administered 2016-12-19: 1.5 mg/kg/h via INTRAVENOUS
  Filled 2016-12-18: qty 25

## 2016-12-18 MED ORDER — VANCOMYCIN HCL 10 G IV SOLR
1500.0000 mg | INTRAVENOUS | Status: AC
  Administered 2016-12-19: 1500 mg via INTRAVENOUS
  Filled 2016-12-18: qty 1500

## 2016-12-18 MED ORDER — HEPARIN SODIUM (PORCINE) 1000 UNIT/ML IJ SOLN
INTRAMUSCULAR | Status: DC
  Filled 2016-12-18: qty 30

## 2016-12-18 MED ORDER — DEXTROSE 5 % IV SOLN
0.0000 ug/min | INTRAVENOUS | Status: DC
  Filled 2016-12-18: qty 4

## 2016-12-18 MED ORDER — NITROGLYCERIN IN D5W 200-5 MCG/ML-% IV SOLN
2.0000 ug/min | INTRAVENOUS | Status: AC
  Administered 2016-12-19: 3 ug/min via INTRAVENOUS
  Filled 2016-12-18: qty 250

## 2016-12-18 NOTE — Anesthesia Preprocedure Evaluation (Addendum)
Anesthesia Evaluation  Patient identified by MRN, date of birth, ID band Patient awake    Reviewed: Allergy & Precautions, H&P , NPO status , Patient's Chart, lab work & pertinent test results  Airway Mallampati: III  TM Distance: >3 FB Neck ROM: Full    Dental no notable dental hx. (+) Teeth Intact, Dental Advisory Given   Pulmonary neg pulmonary ROS,    Pulmonary exam normal breath sounds clear to auscultation       Cardiovascular Exercise Tolerance: Good hypertension, Pt. on medications + CAD   Rhythm:Regular Rate:Normal     Neuro/Psych negative neurological ROS  negative psych ROS   GI/Hepatic Neg liver ROS, GERD  Medicated and Controlled,  Endo/Other  diabetes, Type 2, Oral Hypoglycemic Agents  Renal/GU negative Renal ROS  negative genitourinary   Musculoskeletal  (+) Arthritis , Osteoarthritis,    Abdominal   Peds  Hematology negative hematology ROS (+)   Anesthesia Other Findings   Reproductive/Obstetrics negative OB ROS                            Anesthesia Physical Anesthesia Plan  ASA: IV  Anesthesia Plan: General   Post-op Pain Management:    Induction: Intravenous  Airway Management Planned: Oral ETT  Additional Equipment: Arterial line, CVP, PA Cath, TEE and Ultrasound Guidance Line Placement  Intra-op Plan:   Post-operative Plan: Post-operative intubation/ventilation  Informed Consent: I have reviewed the patients History and Physical, chart, labs and discussed the procedure including the risks, benefits and alternatives for the proposed anesthesia with the patient or authorized representative who has indicated his/her understanding and acceptance.   Dental advisory given  Plan Discussed with: CRNA  Anesthesia Plan Comments:         Anesthesia Quick Evaluation

## 2016-12-19 ENCOUNTER — Inpatient Hospital Stay (HOSPITAL_COMMUNITY): Payer: Medicare Other

## 2016-12-19 ENCOUNTER — Inpatient Hospital Stay (HOSPITAL_COMMUNITY)
Admission: RE | Admit: 2016-12-19 | Discharge: 2016-12-24 | DRG: 236 | Disposition: A | Discharge status: Home / Self Care | Payer: Medicare Other | Source: Ambulatory Visit | Attending: Cardiothoracic Surgery | Admitting: Cardiothoracic Surgery

## 2016-12-19 ENCOUNTER — Encounter (HOSPITAL_COMMUNITY)
Admission: RE | Disposition: A | Discharge status: Home / Self Care | Payer: Self-pay | Source: Ambulatory Visit | Attending: Cardiothoracic Surgery

## 2016-12-19 ENCOUNTER — Encounter (HOSPITAL_COMMUNITY): Payer: Self-pay | Admitting: *Deleted

## 2016-12-19 ENCOUNTER — Inpatient Hospital Stay (HOSPITAL_COMMUNITY): Payer: Medicare Other | Admitting: Certified Registered Nurse Anesthetist

## 2016-12-19 DIAGNOSIS — D62 Acute posthemorrhagic anemia: Secondary | ICD-10-CM | POA: Diagnosis not present

## 2016-12-19 DIAGNOSIS — K219 Gastro-esophageal reflux disease without esophagitis: Secondary | ICD-10-CM | POA: Diagnosis present

## 2016-12-19 DIAGNOSIS — E1122 Type 2 diabetes mellitus with diabetic chronic kidney disease: Secondary | ICD-10-CM | POA: Diagnosis present

## 2016-12-19 DIAGNOSIS — I25119 Atherosclerotic heart disease of native coronary artery with unspecified angina pectoris: Secondary | ICD-10-CM | POA: Diagnosis present

## 2016-12-19 DIAGNOSIS — I2510 Atherosclerotic heart disease of native coronary artery without angina pectoris: Secondary | ICD-10-CM | POA: Diagnosis not present

## 2016-12-19 DIAGNOSIS — Z7984 Long term (current) use of oral hypoglycemic drugs: Secondary | ICD-10-CM

## 2016-12-19 DIAGNOSIS — E7800 Pure hypercholesterolemia, unspecified: Secondary | ICD-10-CM | POA: Diagnosis present

## 2016-12-19 DIAGNOSIS — E8770 Fluid overload, unspecified: Secondary | ICD-10-CM | POA: Diagnosis not present

## 2016-12-19 DIAGNOSIS — Z8546 Personal history of malignant neoplasm of prostate: Secondary | ICD-10-CM

## 2016-12-19 DIAGNOSIS — I25110 Atherosclerotic heart disease of native coronary artery with unstable angina pectoris: Secondary | ICD-10-CM | POA: Diagnosis not present

## 2016-12-19 DIAGNOSIS — E876 Hypokalemia: Secondary | ICD-10-CM | POA: Diagnosis not present

## 2016-12-19 DIAGNOSIS — E785 Hyperlipidemia, unspecified: Secondary | ICD-10-CM | POA: Diagnosis present

## 2016-12-19 DIAGNOSIS — Z8249 Family history of ischemic heart disease and other diseases of the circulatory system: Secondary | ICD-10-CM

## 2016-12-19 DIAGNOSIS — Z833 Family history of diabetes mellitus: Secondary | ICD-10-CM | POA: Diagnosis not present

## 2016-12-19 DIAGNOSIS — Z4682 Encounter for fitting and adjustment of non-vascular catheter: Secondary | ICD-10-CM | POA: Diagnosis not present

## 2016-12-19 DIAGNOSIS — E119 Type 2 diabetes mellitus without complications: Secondary | ICD-10-CM | POA: Diagnosis not present

## 2016-12-19 DIAGNOSIS — R079 Chest pain, unspecified: Secondary | ICD-10-CM | POA: Diagnosis not present

## 2016-12-19 DIAGNOSIS — J939 Pneumothorax, unspecified: Secondary | ICD-10-CM | POA: Diagnosis not present

## 2016-12-19 DIAGNOSIS — I493 Ventricular premature depolarization: Secondary | ICD-10-CM | POA: Diagnosis not present

## 2016-12-19 DIAGNOSIS — F17220 Nicotine dependence, chewing tobacco, uncomplicated: Secondary | ICD-10-CM | POA: Diagnosis present

## 2016-12-19 DIAGNOSIS — R918 Other nonspecific abnormal finding of lung field: Secondary | ICD-10-CM | POA: Diagnosis not present

## 2016-12-19 DIAGNOSIS — Z9079 Acquired absence of other genital organ(s): Secondary | ICD-10-CM | POA: Diagnosis not present

## 2016-12-19 DIAGNOSIS — Z951 Presence of aortocoronary bypass graft: Secondary | ICD-10-CM

## 2016-12-19 DIAGNOSIS — I10 Essential (primary) hypertension: Secondary | ICD-10-CM | POA: Diagnosis not present

## 2016-12-19 DIAGNOSIS — I480 Paroxysmal atrial fibrillation: Secondary | ICD-10-CM | POA: Diagnosis not present

## 2016-12-19 DIAGNOSIS — I4891 Unspecified atrial fibrillation: Secondary | ICD-10-CM | POA: Diagnosis not present

## 2016-12-19 DIAGNOSIS — N182 Chronic kidney disease, stage 2 (mild): Secondary | ICD-10-CM | POA: Diagnosis present

## 2016-12-19 DIAGNOSIS — I129 Hypertensive chronic kidney disease with stage 1 through stage 4 chronic kidney disease, or unspecified chronic kidney disease: Secondary | ICD-10-CM | POA: Diagnosis present

## 2016-12-19 DIAGNOSIS — Z09 Encounter for follow-up examination after completed treatment for conditions other than malignant neoplasm: Secondary | ICD-10-CM

## 2016-12-19 HISTORY — PX: TEE WITHOUT CARDIOVERSION: SHX5443

## 2016-12-19 HISTORY — PX: CORONARY ARTERY BYPASS GRAFT: SHX141

## 2016-12-19 LAB — CBC
HCT: 34.2 % — ABNORMAL LOW (ref 39.0–52.0)
HEMATOCRIT: 33.4 % — AB (ref 39.0–52.0)
Hemoglobin: 11.8 g/dL — ABNORMAL LOW (ref 13.0–17.0)
Hemoglobin: 11.9 g/dL — ABNORMAL LOW (ref 13.0–17.0)
MCH: 31.7 pg (ref 26.0–34.0)
MCH: 30.9 pg (ref 26.0–34.0)
MCHC: 35.3 g/dL (ref 30.0–36.0)
MCHC: 34.8 g/dL (ref 30.0–36.0)
MCV: 89.8 fL (ref 78.0–100.0)
MCV: 88.8 fL (ref 78.0–100.0)
Platelets: 125 10*3/uL — ABNORMAL LOW (ref 150–400)
Platelets: 121 10*3/uL — ABNORMAL LOW (ref 150–400)
RBC: 3.72 MIL/uL — AB (ref 4.22–5.81)
RBC: 3.85 MIL/uL — ABNORMAL LOW (ref 4.22–5.81)
RDW: 12.8 % (ref 11.5–15.5)
RDW: 12.7 % (ref 11.5–15.5)
WBC: 12.3 10*3/uL — AB (ref 4.0–10.5)
WBC: 13.6 10*3/uL — ABNORMAL HIGH (ref 4.0–10.5)

## 2016-12-19 LAB — POCT I-STAT, CHEM 8
BUN: 24 mg/dL — ABNORMAL HIGH (ref 6–20)
BUN: 20 mg/dL (ref 6–20)
BUN: 24 mg/dL — ABNORMAL HIGH (ref 6–20)
BUN: 21 mg/dL — ABNORMAL HIGH (ref 6–20)
BUN: 21 mg/dL — ABNORMAL HIGH (ref 6–20)
BUN: 18 mg/dL (ref 6–20)
Calcium, Ion: 1.23 mmol/L (ref 1.15–1.40)
Calcium, Ion: 0.96 mmol/L — ABNORMAL LOW (ref 1.15–1.40)
Calcium, Ion: 1.23 mmol/L (ref 1.15–1.40)
Calcium, Ion: 1.01 mmol/L — ABNORMAL LOW (ref 1.15–1.40)
Calcium, Ion: 1.07 mmol/L — ABNORMAL LOW (ref 1.15–1.40)
Calcium, Ion: 1.2 mmol/L (ref 1.15–1.40)
Chloride: 104 mmol/L (ref 101–111)
Chloride: 100 mmol/L — ABNORMAL LOW (ref 101–111)
Chloride: 102 mmol/L (ref 101–111)
Chloride: 99 mmol/L — ABNORMAL LOW (ref 101–111)
Chloride: 100 mmol/L — ABNORMAL LOW (ref 101–111)
Chloride: 107 mmol/L (ref 101–111)
Creatinine, Ser: 0.9 mg/dL (ref 0.61–1.24)
Creatinine, Ser: 0.6 mg/dL — ABNORMAL LOW (ref 0.61–1.24)
Creatinine, Ser: 0.8 mg/dL (ref 0.61–1.24)
Creatinine, Ser: 0.7 mg/dL (ref 0.61–1.24)
Creatinine, Ser: 0.8 mg/dL (ref 0.61–1.24)
Creatinine, Ser: 0.8 mg/dL (ref 0.61–1.24)
Glucose, Bld: 152 mg/dL — ABNORMAL HIGH (ref 65–99)
Glucose, Bld: 114 mg/dL — ABNORMAL HIGH (ref 65–99)
Glucose, Bld: 137 mg/dL — ABNORMAL HIGH (ref 65–99)
Glucose, Bld: 154 mg/dL — ABNORMAL HIGH (ref 65–99)
Glucose, Bld: 155 mg/dL — ABNORMAL HIGH (ref 65–99)
Glucose, Bld: 183 mg/dL — ABNORMAL HIGH (ref 65–99)
HCT: 29 % — ABNORMAL LOW (ref 39.0–52.0)
HCT: 24 % — ABNORMAL LOW (ref 39.0–52.0)
HCT: 29 % — ABNORMAL LOW (ref 39.0–52.0)
HCT: 26 % — ABNORMAL LOW (ref 39.0–52.0)
HCT: 27 % — ABNORMAL LOW (ref 39.0–52.0)
HCT: 31 % — ABNORMAL LOW (ref 39.0–52.0)
Hemoglobin: 9.9 g/dL — ABNORMAL LOW (ref 13.0–17.0)
Hemoglobin: 8.2 g/dL — ABNORMAL LOW (ref 13.0–17.0)
Hemoglobin: 9.9 g/dL — ABNORMAL LOW (ref 13.0–17.0)
Hemoglobin: 8.8 g/dL — ABNORMAL LOW (ref 13.0–17.0)
Hemoglobin: 9.2 g/dL — ABNORMAL LOW (ref 13.0–17.0)
Hemoglobin: 10.5 g/dL — ABNORMAL LOW (ref 13.0–17.0)
Potassium: 3.8 mmol/L (ref 3.5–5.1)
Potassium: 3.9 mmol/L (ref 3.5–5.1)
Potassium: 4 mmol/L (ref 3.5–5.1)
Potassium: 4.3 mmol/L (ref 3.5–5.1)
Potassium: 3.9 mmol/L (ref 3.5–5.1)
Potassium: 4.4 mmol/L (ref 3.5–5.1)
Sodium: 140 mmol/L (ref 135–145)
Sodium: 139 mmol/L (ref 135–145)
Sodium: 140 mmol/L (ref 135–145)
Sodium: 134 mmol/L — ABNORMAL LOW (ref 135–145)
Sodium: 137 mmol/L (ref 135–145)
Sodium: 135 mmol/L (ref 135–145)
TCO2: 28 mmol/L (ref 0–100)
TCO2: 26 mmol/L (ref 0–100)
TCO2: 30 mmol/L (ref 0–100)
TCO2: 26 mmol/L (ref 0–100)
TCO2: 28 mmol/L (ref 0–100)
TCO2: 21 mmol/L (ref 0–100)

## 2016-12-19 LAB — POCT I-STAT 3, ART BLOOD GAS (G3+)
Acid-Base Excess: 2 mmol/L (ref 0.0–2.0)
Acid-Base Excess: 1 mmol/L (ref 0.0–2.0)
Acid-Base Excess: 2 mmol/L (ref 0.0–2.0)
Acid-Base Excess: 2 mmol/L (ref 0.0–2.0)
Acid-base deficit: 3 mmol/L — ABNORMAL HIGH (ref 0.0–2.0)
Acid-base deficit: 6 mmol/L — ABNORMAL HIGH (ref 0.0–2.0)
Bicarbonate: 27.8 mmol/L (ref 20.0–28.0)
Bicarbonate: 26.3 mmol/L (ref 20.0–28.0)
Bicarbonate: 25.4 mmol/L (ref 20.0–28.0)
Bicarbonate: 22.6 mmol/L (ref 20.0–28.0)
Bicarbonate: 19.4 mmol/L — ABNORMAL LOW (ref 20.0–28.0)
Bicarbonate: 26.2 mmol/L (ref 20.0–28.0)
O2 Saturation: 100 %
O2 Saturation: 100 %
O2 Saturation: 100 %
O2 Saturation: 96 %
O2 Saturation: 99 %
O2 Saturation: 99 %
Patient temperature: 35.3
Patient temperature: 37.3
Patient temperature: 37.6
TCO2: 29 mmol/L (ref 0–100)
TCO2: 28 mmol/L (ref 0–100)
TCO2: 26 mmol/L (ref 0–100)
TCO2: 24 mmol/L (ref 0–100)
TCO2: 20 mmol/L (ref 0–100)
TCO2: 27 mmol/L (ref 0–100)
pCO2 arterial: 46.5 mmHg (ref 32.0–48.0)
pCO2 arterial: 43 mmHg (ref 32.0–48.0)
pCO2 arterial: 34.5 mmHg (ref 32.0–48.0)
pCO2 arterial: 40.8 mmHg (ref 32.0–48.0)
pCO2 arterial: 38.4 mmHg (ref 32.0–48.0)
pCO2 arterial: 38.6 mmHg (ref 32.0–48.0)
pH, Arterial: 7.384 (ref 7.350–7.450)
pH, Arterial: 7.394 (ref 7.350–7.450)
pH, Arterial: 7.475 — ABNORMAL HIGH (ref 7.350–7.450)
pH, Arterial: 7.342 — ABNORMAL LOW (ref 7.350–7.450)
pH, Arterial: 7.312 — ABNORMAL LOW (ref 7.350–7.450)
pH, Arterial: 7.443 (ref 7.350–7.450)
pO2, Arterial: 377 mmHg — ABNORMAL HIGH (ref 83.0–108.0)
pO2, Arterial: 531 mmHg — ABNORMAL HIGH (ref 83.0–108.0)
pO2, Arterial: 370 mmHg — ABNORMAL HIGH (ref 83.0–108.0)
pO2, Arterial: 78 mmHg — ABNORMAL LOW (ref 83.0–108.0)
pO2, Arterial: 141 mmHg — ABNORMAL HIGH (ref 83.0–108.0)
pO2, Arterial: 125 mmHg — ABNORMAL HIGH (ref 83.0–108.0)

## 2016-12-19 LAB — GLUCOSE, CAPILLARY
Glucose-Capillary: 147 mg/dL — ABNORMAL HIGH (ref 65–99)
Glucose-Capillary: 115 mg/dL — ABNORMAL HIGH (ref 65–99)
Glucose-Capillary: 142 mg/dL — ABNORMAL HIGH (ref 65–99)
Glucose-Capillary: 139 mg/dL — ABNORMAL HIGH (ref 65–99)
Glucose-Capillary: 97 mg/dL (ref 65–99)
Glucose-Capillary: 181 mg/dL — ABNORMAL HIGH (ref 65–99)
Glucose-Capillary: 197 mg/dL — ABNORMAL HIGH (ref 65–99)

## 2016-12-19 LAB — POCT I-STAT 4, (NA,K, GLUC, HGB,HCT)
Glucose, Bld: 128 mg/dL — ABNORMAL HIGH (ref 65–99)
HCT: 32 % — ABNORMAL LOW (ref 39.0–52.0)
Hemoglobin: 10.9 g/dL — ABNORMAL LOW (ref 13.0–17.0)
Potassium: 3.5 mmol/L (ref 3.5–5.1)
Sodium: 139 mmol/L (ref 135–145)

## 2016-12-19 LAB — APTT: APTT: 29 s (ref 24–36)

## 2016-12-19 LAB — CREATININE, SERUM
Creatinine, Ser: 1 mg/dL (ref 0.61–1.24)
GFR calc Af Amer: >60 mL/min (ref >60)
GFR calc non Af Amer: >60 mL/min (ref >60)

## 2016-12-19 LAB — PROTIME-INR
INR: 1.28
Prothrombin Time: 16 seconds — ABNORMAL HIGH (ref 11.4–15.2)

## 2016-12-19 LAB — MAGNESIUM: Magnesium: 2.8 mg/dL — ABNORMAL HIGH (ref 1.7–2.4)

## 2016-12-19 LAB — PLATELET COUNT: Platelets: 141 10*3/uL — ABNORMAL LOW (ref 150–400)

## 2016-12-19 LAB — HEMOGLOBIN AND HEMATOCRIT, BLOOD
HCT: 28.1 % — ABNORMAL LOW (ref 39.0–52.0)
Hemoglobin: 9.7 g/dL — ABNORMAL LOW (ref 13.0–17.0)

## 2016-12-19 LAB — PREPARE RBC (CROSSMATCH)

## 2016-12-19 SURGERY — CORONARY ARTERY BYPASS GRAFTING (CABG)
Anesthesia: General | Site: Chest

## 2016-12-19 MED ORDER — MORPHINE SULFATE (PF) 2 MG/ML IV SOLN
2.0000 mg | INTRAVENOUS | Status: DC | PRN
  Administered 2016-12-19: 2 mg via INTRAVENOUS
  Administered 2016-12-20 (×3): 4 mg via INTRAVENOUS
  Filled 2016-12-19: qty 1
  Filled 2016-12-19: qty 2
  Filled 2016-12-19 (×2): qty 1
  Filled 2016-12-19 (×2): qty 2
  Filled 2016-12-19: qty 1

## 2016-12-19 MED ORDER — METOPROLOL TARTRATE 5 MG/5ML IV SOLN: 2.5000 mg | INTRAVENOUS | Status: DC | PRN

## 2016-12-19 MED ORDER — ROCURONIUM BROMIDE 50 MG/5ML IV SOSY
PREFILLED_SYRINGE | INTRAVENOUS | Status: AC
  Filled 2016-12-19: qty 5

## 2016-12-19 MED ORDER — METOPROLOL TARTRATE 25 MG/10 ML ORAL SUSPENSION
12.5000 mg | Freq: Two times a day (BID) | ORAL | Status: DC
  Administered 2016-12-19 – 2016-12-20 (×2): 12.5 mg
  Filled 2016-12-19 (×3): qty 5

## 2016-12-19 MED ORDER — GABAPENTIN 300 MG PO CAPS
300.0000 mg | ORAL_CAPSULE | Freq: Three times a day (TID) | ORAL | Status: DC
  Administered 2016-12-20 – 2016-12-24 (×14): 300 mg via ORAL
  Filled 2016-12-19 (×14): qty 1

## 2016-12-19 MED ORDER — PROTAMINE SULFATE 10 MG/ML IV SOLN
INTRAVENOUS | Status: AC
  Filled 2016-12-19: qty 25

## 2016-12-19 MED ORDER — ONDANSETRON HCL 4 MG/2ML IJ SOLN
INTRAMUSCULAR | Status: AC
  Filled 2016-12-19: qty 2

## 2016-12-19 MED ORDER — NITROGLYCERIN IN D5W 200-5 MCG/ML-% IV SOLN: 0.0000 ug/min | INTRAVENOUS | Status: DC

## 2016-12-19 MED ORDER — BISACODYL 10 MG RE SUPP: 10.0000 mg | Freq: Every day | RECTAL | Status: DC

## 2016-12-19 MED ORDER — PRAVASTATIN SODIUM 20 MG PO TABS
20.0000 mg | ORAL_TABLET | Freq: Every day | ORAL | Status: DC
  Administered 2016-12-20 – 2016-12-24 (×5): 20 mg via ORAL
  Filled 2016-12-19 (×5): qty 1

## 2016-12-19 MED ORDER — DEXMEDETOMIDINE HCL IN NACL 200 MCG/50ML IV SOLN
INTRAVENOUS | Status: AC
  Filled 2016-12-19: qty 50

## 2016-12-19 MED ORDER — POTASSIUM CHLORIDE 2 MEQ/ML IV SOLN
30.0000 meq | Freq: Once | INTRAVENOUS | Status: AC
  Administered 2016-12-19: 30 meq via INTRAVENOUS
  Filled 2016-12-19: qty 15

## 2016-12-19 MED ORDER — CHLORHEXIDINE GLUCONATE 4 % EX LIQD: 30.0000 mL | CUTANEOUS | Status: DC

## 2016-12-19 MED ORDER — TRAMADOL HCL 50 MG PO TABS: 50.0000 mg | ORAL_TABLET | ORAL | Status: DC | PRN

## 2016-12-19 MED ORDER — ACETAMINOPHEN 160 MG/5ML PO SOLN: 1000.0000 mg | Freq: Four times a day (QID) | ORAL | Status: DC

## 2016-12-19 MED ORDER — SODIUM CHLORIDE 0.9% FLUSH
3.0000 mL | Freq: Two times a day (BID) | INTRAVENOUS | Status: DC
  Administered 2016-12-20: 3 mL via INTRAVENOUS

## 2016-12-19 MED ORDER — MIDAZOLAM HCL 5 MG/5ML IJ SOLN
INTRAMUSCULAR | Status: DC | PRN
  Administered 2016-12-19: 2 mg via INTRAVENOUS
  Administered 2016-12-19: 3 mg via INTRAVENOUS
  Administered 2016-12-19: 2 mg via INTRAVENOUS
  Administered 2016-12-19: 1 mg via INTRAVENOUS

## 2016-12-19 MED ORDER — PROPOFOL 10 MG/ML IV BOLUS
INTRAVENOUS | Status: AC
  Filled 2016-12-19: qty 20

## 2016-12-19 MED ORDER — SODIUM CHLORIDE 0.9 % IV SOLN: Freq: Once | INTRAVENOUS | Status: DC

## 2016-12-19 MED ORDER — OXYCODONE HCL 5 MG PO TABS
5.0000 mg | ORAL_TABLET | ORAL | Status: DC | PRN
  Administered 2016-12-20 (×2): 10 mg via ORAL
  Filled 2016-12-19 (×2): qty 2

## 2016-12-19 MED ORDER — SODIUM CHLORIDE 0.9 % IV SOLN: 250.0000 mL | INTRAVENOUS | Status: DC

## 2016-12-19 MED ORDER — PROTAMINE SULFATE 10 MG/ML IV SOLN
INTRAVENOUS | Status: DC | PRN
  Administered 2016-12-19: 280 mg via INTRAVENOUS

## 2016-12-19 MED ORDER — MORPHINE SULFATE (PF) 2 MG/ML IV SOLN
1.0000 mg | INTRAVENOUS | Status: AC | PRN
  Administered 2016-12-19: 3 mg via INTRAVENOUS
  Administered 2016-12-19: 1 mg via INTRAVENOUS
  Administered 2016-12-19: 2 mg via INTRAVENOUS

## 2016-12-19 MED ORDER — FENTANYL CITRATE (PF) 250 MCG/5ML IJ SOLN
INTRAMUSCULAR | Status: DC | PRN
  Administered 2016-12-19: 50 ug via INTRAVENOUS
  Administered 2016-12-19: 650 ug via INTRAVENOUS
  Administered 2016-12-19 (×2): 100 ug via INTRAVENOUS
  Administered 2016-12-19: 50 ug via INTRAVENOUS
  Administered 2016-12-19: 100 ug via INTRAVENOUS
  Administered 2016-12-19 (×2): 50 ug via INTRAVENOUS
  Administered 2016-12-19 (×2): 250 ug via INTRAVENOUS
  Administered 2016-12-19: 100 ug via INTRAVENOUS

## 2016-12-19 MED ORDER — CHLORHEXIDINE GLUCONATE 0.12% ORAL RINSE (MEDLINE KIT)
15.0000 mL | Freq: Two times a day (BID) | OROMUCOSAL | Status: DC
  Administered 2016-12-19: 15 mL via OROMUCOSAL

## 2016-12-19 MED ORDER — CHLORHEXIDINE GLUCONATE 0.12 % MT SOLN
15.0000 mL | OROMUCOSAL | Status: AC
  Administered 2016-12-19: 15 mL via OROMUCOSAL

## 2016-12-19 MED ORDER — ROCURONIUM BROMIDE 10 MG/ML (PF) SYRINGE
PREFILLED_SYRINGE | INTRAVENOUS | Status: DC | PRN
  Administered 2016-12-19 (×2): 50 mg via INTRAVENOUS

## 2016-12-19 MED ORDER — MIDAZOLAM HCL 2 MG/2ML IJ SOLN: 2.0000 mg | INTRAMUSCULAR | Status: DC | PRN

## 2016-12-19 MED ORDER — 0.9 % SODIUM CHLORIDE (POUR BTL) OPTIME
TOPICAL | Status: DC | PRN
  Administered 2016-12-19: 4000 mL

## 2016-12-19 MED ORDER — DEXMEDETOMIDINE HCL IN NACL 200 MCG/50ML IV SOLN
0.0000 ug/kg/h | INTRAVENOUS | Status: DC
  Administered 2016-12-19: 0.7 ug/kg/h via INTRAVENOUS

## 2016-12-19 MED ORDER — ACETAMINOPHEN 650 MG RE SUPP
650.0000 mg | Freq: Once | RECTAL | Status: AC
  Administered 2016-12-19: 650 mg via RECTAL

## 2016-12-19 MED ORDER — VECURONIUM BROMIDE 10 MG IV SOLR
INTRAVENOUS | Status: AC
  Filled 2016-12-19: qty 10

## 2016-12-19 MED ORDER — SODIUM CHLORIDE 0.9 % IV SOLN
INTRAVENOUS | Status: DC | PRN
  Administered 2016-12-19: 13:00:00 via INTRAVENOUS

## 2016-12-19 MED ORDER — MIDAZOLAM HCL 10 MG/2ML IJ SOLN
INTRAMUSCULAR | Status: AC
  Filled 2016-12-19: qty 2

## 2016-12-19 MED ORDER — LABETALOL HCL 5 MG/ML IV SOLN: 10.0000 mg | INTRAVENOUS | Status: DC | PRN

## 2016-12-19 MED ORDER — LACTATED RINGERS IV SOLN
INTRAVENOUS | Status: DC | PRN
  Administered 2016-12-19: 08:00:00 via INTRAVENOUS

## 2016-12-19 MED ORDER — FENTANYL CITRATE (PF) 250 MCG/5ML IJ SOLN
INTRAMUSCULAR | Status: AC
  Filled 2016-12-19: qty 5

## 2016-12-19 MED ORDER — LACTATED RINGERS IV SOLN
INTRAVENOUS | Status: DC | PRN
  Administered 2016-12-19 (×2): via INTRAVENOUS

## 2016-12-19 MED ORDER — SODIUM CHLORIDE 0.9 % IV SOLN
INTRAVENOUS | Status: DC
  Administered 2016-12-19: 1.4 [IU]/h via INTRAVENOUS
  Filled 2016-12-19: qty 2.5

## 2016-12-19 MED ORDER — HEPARIN SODIUM (PORCINE) 1000 UNIT/ML IJ SOLN
INTRAMUSCULAR | Status: AC
  Filled 2016-12-19: qty 1

## 2016-12-19 MED ORDER — ARTIFICIAL TEARS OP OINT
TOPICAL_OINTMENT | OPHTHALMIC | Status: DC | PRN
  Administered 2016-12-19: 1 via OPHTHALMIC

## 2016-12-19 MED ORDER — ACETAMINOPHEN 500 MG PO TABS
1000.0000 mg | ORAL_TABLET | Freq: Four times a day (QID) | ORAL | Status: DC
  Administered 2016-12-19 – 2016-12-21 (×6): 1000 mg via ORAL
  Filled 2016-12-19 (×6): qty 2

## 2016-12-19 MED ORDER — SODIUM CHLORIDE 0.9% FLUSH: 3.0000 mL | INTRAVENOUS | Status: DC | PRN

## 2016-12-19 MED ORDER — HEPARIN SODIUM (PORCINE) 1000 UNIT/ML IJ SOLN
INTRAMUSCULAR | Status: DC | PRN
  Administered 2016-12-19: 28000 [IU] via INTRAVENOUS

## 2016-12-19 MED ORDER — PROPOFOL 10 MG/ML IV BOLUS
INTRAVENOUS | Status: DC | PRN
Start: 2016-12-19 — End: 2016-12-19
  Administered 2016-12-19: 80 mg via INTRAVENOUS

## 2016-12-19 MED ORDER — HEMOSTATIC AGENTS (NO CHARGE) OPTIME
TOPICAL | Status: DC | PRN
  Administered 2016-12-19: 1 via TOPICAL

## 2016-12-19 MED ORDER — METOCLOPRAMIDE HCL 5 MG/ML IJ SOLN
10.0000 mg | Freq: Four times a day (QID) | INTRAMUSCULAR | Status: DC
  Administered 2016-12-19 – 2016-12-21 (×7): 10 mg via INTRAVENOUS
  Filled 2016-12-19 (×6): qty 2

## 2016-12-19 MED ORDER — LACTATED RINGERS IV SOLN: INTRAVENOUS | Status: DC

## 2016-12-19 MED ORDER — ASPIRIN EC 325 MG PO TBEC
325.0000 mg | DELAYED_RELEASE_TABLET | Freq: Every day | ORAL | Status: DC
  Administered 2016-12-20: 325 mg via ORAL
  Filled 2016-12-19: qty 1

## 2016-12-19 MED ORDER — BISACODYL 5 MG PO TBEC
10.0000 mg | DELAYED_RELEASE_TABLET | Freq: Every day | ORAL | Status: DC
  Administered 2016-12-20: 10 mg via ORAL
  Filled 2016-12-19: qty 2

## 2016-12-19 MED ORDER — PHENYLEPHRINE HCL 10 MG/ML IJ SOLN
0.0000 ug/min | INTRAVENOUS | Status: DC
  Filled 2016-12-19: qty 2

## 2016-12-19 MED ORDER — DEXTROSE 5 % IV SOLN
1.5000 g | Freq: Two times a day (BID) | INTRAVENOUS | Status: AC
  Administered 2016-12-19 – 2016-12-21 (×4): 1.5 g via INTRAVENOUS
  Filled 2016-12-19 (×4): qty 1.5

## 2016-12-19 MED ORDER — FENTANYL CITRATE (PF) 250 MCG/5ML IJ SOLN
INTRAMUSCULAR | Status: AC
  Filled 2016-12-19: qty 20

## 2016-12-19 MED ORDER — PROTAMINE SULFATE 10 MG/ML IV SOLN
INTRAVENOUS | Status: AC
  Filled 2016-12-19: qty 5

## 2016-12-19 MED ORDER — ACETAMINOPHEN 160 MG/5ML PO SOLN: 650.0000 mg | Freq: Once | ORAL | Status: AC

## 2016-12-19 MED ORDER — DOCUSATE SODIUM 100 MG PO CAPS
200.0000 mg | ORAL_CAPSULE | Freq: Every day | ORAL | Status: DC
  Administered 2016-12-20: 200 mg via ORAL
  Filled 2016-12-19: qty 2

## 2016-12-19 MED ORDER — ORAL CARE MOUTH RINSE
15.0000 mL | Freq: Four times a day (QID) | OROMUCOSAL | Status: DC
  Administered 2016-12-19: 15 mL via OROMUCOSAL

## 2016-12-19 MED ORDER — METOPROLOL TARTRATE 12.5 MG HALF TABLET
12.5000 mg | ORAL_TABLET | Freq: Two times a day (BID) | ORAL | Status: DC
  Administered 2016-12-20: 12.5 mg via ORAL
  Filled 2016-12-19: qty 1

## 2016-12-19 MED ORDER — FAMOTIDINE IN NACL 20-0.9 MG/50ML-% IV SOLN
20.0000 mg | Freq: Two times a day (BID) | INTRAVENOUS | Status: AC
  Administered 2016-12-19: 20 mg via INTRAVENOUS

## 2016-12-19 MED ORDER — MAGNESIUM SULFATE 4 GM/100ML IV SOLN
4.0000 g | Freq: Once | INTRAVENOUS | Status: AC
  Administered 2016-12-19: 4 g via INTRAVENOUS
  Filled 2016-12-19: qty 100

## 2016-12-19 MED ORDER — HEMOSTATIC AGENTS (NO CHARGE) OPTIME
TOPICAL | Status: DC | PRN
  Administered 2016-12-19: 3 via TOPICAL

## 2016-12-19 MED ORDER — INSULIN REGULAR BOLUS VIA INFUSION
0.0000 [IU] | Freq: Three times a day (TID) | INTRAVENOUS | Status: DC
  Filled 2016-12-19: qty 10

## 2016-12-19 MED ORDER — SODIUM CHLORIDE 0.9 % IV SOLN: INTRAVENOUS | Status: DC

## 2016-12-19 MED ORDER — VECURONIUM BROMIDE 10 MG IV SOLR
INTRAVENOUS | Status: DC | PRN
Start: 2016-12-19 — End: 2016-12-19
  Administered 2016-12-19 (×2): 5 mg via INTRAVENOUS
  Administered 2016-12-19: 10 mg via INTRAVENOUS

## 2016-12-19 MED ORDER — LACTATED RINGERS IV SOLN: 500.0000 mL | Freq: Once | INTRAVENOUS | Status: DC | PRN

## 2016-12-19 MED ORDER — ALBUMIN HUMAN 5 % IV SOLN
250.0000 mL | INTRAVENOUS | Status: AC | PRN
  Administered 2016-12-19 (×2): 250 mL via INTRAVENOUS

## 2016-12-19 MED ORDER — SODIUM CHLORIDE 0.45 % IV SOLN: INTRAVENOUS | Status: DC | PRN

## 2016-12-19 MED ORDER — VANCOMYCIN HCL IN DEXTROSE 1-5 GM/200ML-% IV SOLN
1000.0000 mg | Freq: Once | INTRAVENOUS | Status: AC
  Administered 2016-12-19: 1000 mg via INTRAVENOUS
  Filled 2016-12-19: qty 200

## 2016-12-19 MED ORDER — PANTOPRAZOLE SODIUM 40 MG PO TBEC: 40.0000 mg | DELAYED_RELEASE_TABLET | Freq: Every day | ORAL | Status: DC

## 2016-12-19 MED ORDER — ASPIRIN 81 MG PO CHEW: 324.0000 mg | CHEWABLE_TABLET | Freq: Every day | ORAL | Status: DC

## 2016-12-19 MED ORDER — PHENYLEPHRINE 40 MCG/ML (10ML) SYRINGE FOR IV PUSH (FOR BLOOD PRESSURE SUPPORT)
PREFILLED_SYRINGE | INTRAVENOUS | Status: AC
  Filled 2016-12-19: qty 10

## 2016-12-19 MED ORDER — ONDANSETRON HCL 4 MG/2ML IJ SOLN
4.0000 mg | Freq: Four times a day (QID) | INTRAMUSCULAR | Status: DC | PRN
Start: 2016-12-19 — End: 2016-12-21

## 2016-12-19 MED ORDER — LACTATED RINGERS IV SOLN: INTRAVENOUS | Status: DC | PRN

## 2016-12-19 MED FILL — Heparin Sodium (Porcine) Inj 1000 Unit/ML: INTRAMUSCULAR | Qty: 30 | Status: AC

## 2016-12-19 MED FILL — Magnesium Sulfate Inj 50%: INTRAMUSCULAR | Qty: 10 | Status: AC

## 2016-12-19 MED FILL — Potassium Chloride Inj 2 mEq/ML: INTRAVENOUS | Qty: 40 | Status: AC

## 2016-12-19 SURGICAL SUPPLY — 71 items
BAG DECANTER FOR FLEXI CONT (MISCELLANEOUS) ×3 IMPLANT
BANDAGE ACE 4X5 VEL STRL LF (GAUZE/BANDAGES/DRESSINGS) ×3 IMPLANT
BANDAGE ACE 6X5 VEL STRL LF (GAUZE/BANDAGES/DRESSINGS) ×3 IMPLANT
BLADE STERNUM SYSTEM 6 (BLADE) ×3 IMPLANT
BLADE SURG 11 STRL SS (BLADE) ×3 IMPLANT
BNDG GAUZE ELAST 4 BULKY (GAUZE/BANDAGES/DRESSINGS) ×3 IMPLANT
CANISTER SUCTION 2500CC (MISCELLANEOUS) ×3 IMPLANT
CANNULA MC2 2 STG 32/40 NON-V (CANNULA) ×2 IMPLANT
CANNULA VENOUS 2 STG 32/40 (CANNULA) ×1
CATH CPB KIT GERHARDT (MISCELLANEOUS) ×3 IMPLANT
CATH THORACIC 28FR (CATHETERS) ×3 IMPLANT
CLIP FOGARTY SPRING 6M (CLIP) ×3 IMPLANT
CRADLE DONUT ADULT HEAD (MISCELLANEOUS) ×3 IMPLANT
DRAIN CHANNEL 28F RND 3/8 FF (WOUND CARE) ×3 IMPLANT
DRAPE CARDIOVASCULAR INCISE (DRAPES) ×1
DRAPE SLUSH/WARMER DISC (DRAPES) ×3 IMPLANT
DRAPE SRG 135X102X78XABS (DRAPES) ×2 IMPLANT
DRSG AQUACEL AG ADV 3.5X14 (GAUZE/BANDAGES/DRESSINGS) ×3 IMPLANT
ELECT BLADE 4.0 EZ CLEAN MEGAD (MISCELLANEOUS) ×3
ELECT REM PT RETURN 9FT ADLT (ELECTROSURGICAL) ×6
ELECTRODE BLDE 4.0 EZ CLN MEGD (MISCELLANEOUS) ×2 IMPLANT
ELECTRODE REM PT RTRN 9FT ADLT (ELECTROSURGICAL) ×4 IMPLANT
FELT TEFLON 1X6 (MISCELLANEOUS) ×6 IMPLANT
GAUZE SPONGE 4X4 12PLY STRL (GAUZE/BANDAGES/DRESSINGS) ×6 IMPLANT
GLOVE BIO SURGEON STRL SZ 6 (GLOVE) ×6 IMPLANT
GLOVE BIO SURGEON STRL SZ 6.5 (GLOVE) ×30 IMPLANT
GOWN STRL REUS W/ TWL LRG LVL3 (GOWN DISPOSABLE) ×8 IMPLANT
GOWN STRL REUS W/TWL LRG LVL3 (GOWN DISPOSABLE) ×4
HEMOSTAT POWDER SURGIFOAM 1G (HEMOSTASIS) ×18 IMPLANT
HEMOSTAT SURGICEL 2X14 (HEMOSTASIS) ×9 IMPLANT
KIT BASIN OR (CUSTOM PROCEDURE TRAY) ×3 IMPLANT
KIT CATH SUCT 8FR (CATHETERS) ×3 IMPLANT
KIT ROOM TURNOVER OR (KITS) ×3 IMPLANT
KIT SUCTION CATH 14FR (SUCTIONS) ×6 IMPLANT
KIT VASOVIEW HEMOPRO VH 3000 (KITS) ×3 IMPLANT
LEAD PACING MYOCARDI (MISCELLANEOUS) ×3 IMPLANT
MARKER GRAFT CORONARY BYPASS (MISCELLANEOUS) ×9 IMPLANT
NS IRRIG 1000ML POUR BTL (IV SOLUTION) ×18 IMPLANT
PACK OPEN HEART (CUSTOM PROCEDURE TRAY) ×3 IMPLANT
PAD ARMBOARD 7.5X6 YLW CONV (MISCELLANEOUS) ×6 IMPLANT
PAD ELECT DEFIB RADIOL ZOLL (MISCELLANEOUS) ×3 IMPLANT
PENCIL BUTTON HOLSTER BLD 10FT (ELECTRODE) ×3 IMPLANT
PUNCH AORTIC ROTATE 4.0MM (MISCELLANEOUS) ×3 IMPLANT
SET CARDIOPLEGIA MPS 5001102 (MISCELLANEOUS) ×3 IMPLANT
SPOGE SURGIFLO 8M (HEMOSTASIS) ×1
SPONGE GAUZE 4X4 12PLY STER LF (GAUZE/BANDAGES/DRESSINGS) ×6 IMPLANT
SPONGE LAP 18X18 X RAY DECT (DISPOSABLE) ×3 IMPLANT
SPONGE SURGIFLO 8M (HEMOSTASIS) ×2 IMPLANT
SUT BONE WAX W31G (SUTURE) ×3 IMPLANT
SUT MNCRL AB 4-0 PS2 18 (SUTURE) ×3 IMPLANT
SUT PROLENE 3 0 SH1 36 (SUTURE) ×6 IMPLANT
SUT PROLENE 4 0 TF (SUTURE) ×6 IMPLANT
SUT PROLENE 6 0 C 1 30 (SUTURE) ×6 IMPLANT
SUT PROLENE 6 0 CC (SUTURE) ×9 IMPLANT
SUT PROLENE 7 0 BV1 MDA (SUTURE) ×6 IMPLANT
SUT PROLENE 8 0 BV175 6 (SUTURE) ×18 IMPLANT
SUT STEEL 6MS V (SUTURE) ×3 IMPLANT
SUT STEEL SZ 6 DBL 3X14 BALL (SUTURE) ×3 IMPLANT
SUT VIC AB 1 CTX 18 (SUTURE) ×6 IMPLANT
SUT VIC AB 2-0 CT1 27 (SUTURE) ×1
SUT VIC AB 2-0 CT1 TAPERPNT 27 (SUTURE) ×2 IMPLANT
SUTURE E-PAK OPEN HEART (SUTURE) ×3 IMPLANT
SYSTEM SAHARA CHEST DRAIN ATS (WOUND CARE) ×3 IMPLANT
TAPE CLOTH SURG 4X10 WHT LF (GAUZE/BANDAGES/DRESSINGS) ×3 IMPLANT
TAPE PAPER 2X10 WHT MICROPORE (GAUZE/BANDAGES/DRESSINGS) ×3 IMPLANT
TOWEL OR 17X24 6PK STRL BLUE (TOWEL DISPOSABLE) ×6 IMPLANT
TOWEL OR 17X26 10 PK STRL BLUE (TOWEL DISPOSABLE) ×6 IMPLANT
TRAY FOLEY IC TEMP SENS 16FR (CATHETERS) ×3 IMPLANT
TUBING INSUFFLATION (TUBING) ×3 IMPLANT
UNDERPAD 30X30 (UNDERPADS AND DIAPERS) ×3 IMPLANT
WATER STERILE IRR 1000ML POUR (IV SOLUTION) ×6 IMPLANT

## 2016-12-19 NOTE — Anesthesia Postprocedure Evaluation (Signed)
Anesthesia Post Note  Patient: Andrew Williamson  Procedure(s) Performed: Procedure(s) (LRB): CORONARY ARTERY BYPASS GRAFTING (CABG) x three , using left internal mammary artery and right leg greater saphenous vein harvested endoscopically; SVG to Ramus, SVG to circ, LIMA to LAD  (N/A) TRANSESOPHAGEAL ECHOCARDIOGRAM (TEE) (N/A)  Patient location during evaluation: SICU Anesthesia Type: General Level of consciousness: sedated Pain management: pain level controlled Vital Signs Assessment: post-procedure vital signs reviewed and stable Respiratory status: patient remains intubated per anesthesia plan Cardiovascular status: stable Anesthetic complications: no       Last Vitals:  Vitals:   12/19/16 1600 12/19/16 1615  BP: 110/76   Pulse: 89 89  Resp: 12 12  Temp: (!) 35.7 C (!) 36 C    Last Pain:  Vitals:   12/19/16 0640  TempSrc: Oral                 Andrew Williamson,W. EDMOND

## 2016-12-19 NOTE — Progress Notes (Signed)
RT NOTE:  CPAP/PS started  

## 2016-12-19 NOTE — OR Nursing (Signed)
13:50 - 20 minute call to SICU charge

## 2016-12-19 NOTE — Anesthesia Procedure Notes (Signed)
Procedure Name: Intubation Date/Time: 12/19/2016 8:45 AM Performed by: Rejeana Brock L Pre-anesthesia Checklist: Patient identified, Emergency Drugs available, Suction available and Patient being monitored Patient Re-evaluated:Patient Re-evaluated prior to inductionOxygen Delivery Method: Circle System Utilized Preoxygenation: Pre-oxygenation with 100% oxygen Intubation Type: IV induction Ventilation: Mask ventilation without difficulty Laryngoscope Size: Glidescope and 4 Grade View: Grade I Tube type: Oral Tube size: 8.0 mm Number of attempts: 1 Airway Equipment and Method: Stylet and Oral airway Placement Confirmation: ETT inserted through vocal cords under direct vision,  positive ETCO2 and breath sounds checked- equal and bilateral Secured at: 22 cm Tube secured with: Tape Dental Injury: Teeth and Oropharynx as per pre-operative assessment  Difficulty Due To: Difficult Airway- due to limited oral opening and Difficult Airway- due to anterior larynx Comments: Elective glidescope

## 2016-12-19 NOTE — Progress Notes (Signed)
RT NOTE:  Rapid Wean started 

## 2016-12-19 NOTE — Transfer of Care (Signed)
Immediate Anesthesia Transfer of Care Note  Patient: Andrew Williamson  Procedure(s) Performed: Procedure(s): CORONARY ARTERY BYPASS GRAFTING (CABG) x three , using left internal mammary artery and right leg greater saphenous vein harvested endoscopically; SVG to Ramus, SVG to circ, LIMA to LAD  (N/A) TRANSESOPHAGEAL ECHOCARDIOGRAM (TEE) (N/A)  Patient Location: SICU  Anesthesia Type:General  Level of Consciousness: Patient remains intubated per anesthesia plan  Airway & Oxygen Therapy: Patient remains intubated per anesthesia plan and Patient placed on Ventilator (see vital sign flow sheet for setting)  Post-op Assessment: Report given to RN and Post -op Vital signs reviewed and stable  Post vital signs: Reviewed and stable  Last Vitals:  Vitals:   12/19/16 0640  BP: (!) 145/68  Pulse: 84  Resp: 20  Temp: 36.7 C    Last Pain:  Vitals:   12/19/16 0640  TempSrc: Oral         Complications: No apparent anesthesia complications

## 2016-12-19 NOTE — Brief Op Note (Signed)
12/19/2016  12:28 PM  PATIENT:  Andrew Williamson  71 y.o. male  PRE-OPERATIVE DIAGNOSIS:  CAD  POST-OPERATIVE DIAGNOSIS:  CAD  PROCEDURE:  Procedure(s): CORONARY ARTERY BYPASS GRAFTING (CABG) x , using left internal mammary artery and right leg greater saphenous vein harvested endoscopically (N/A) TRANSESOPHAGEAL ECHOCARDIOGRAM (TEE) (N/A)  SVG to Ramus SVG to circ LIMA to LAD  SURGEON:  Surgeon(s) and Role:    * [name], MD - Primary  PHYSICIAN ASSISTANT:  Nicholes Rough, PA-C  ANESTHESIA:   general  EBL:  Total I/O In: -  Out: 550 [Urine:550]  BLOOD ADMINISTERED: 2 units RBC  DRAINS: routine   LOCAL MEDICATIONS USED:  NONE  SPECIMEN:  No Specimen  DISPOSITION OF SPECIMEN:  N/A  COUNTS:  YES  TOURNIQUET:  * No tourniquets in log *  DICTATION: .Other Dictation: Dictation Number pending  PLAN OF CARE: Admit to inpatient   PATIENT DISPOSITION:  ICU - intubated and hemodynamically stable.   Delay start of Pharmacological VTE agent (>24hrs) due to surgical blood loss or risk of bleeding: yes

## 2016-12-19 NOTE — Procedures (Signed)
Extubation Procedure Note  Rapid wean completed without complication. Follows all commands. NIF -36 VC 1500 Cuff leak present, No Stridor, strong cough, speaks name/location 4L [place] (SpO2 96%) RT will monitor  Patient Details:   Name: Andrew Williamson DOB: Oct 25, 1946 MRN: [id number]   Airway Documentation:     Evaluation  O2 sats: stable throughout Complications: No apparent complications Patient did tolerate procedure well. Bilateral Breath Sounds: Clear   Yes  Sharen Hint 12/19/2016, 8:23 PM

## 2016-12-19 NOTE — Anesthesia Procedure Notes (Addendum)
Central Venous Catheter Insertion Performed by: Roderic Palau, anesthesiologist Start/End1/09/2017 7:40 AM, 12/19/2016 7:50 AM Patient location: Pre-op. Preanesthetic checklist: patient identified, IV checked, site marked, risks and benefits discussed, surgical consent, monitors and equipment checked, pre-op evaluation, timeout performed and anesthesia consent Position: Trendelenburg Lidocaine 1% used for infiltration and patient sedated Hand hygiene performed , maximum sterile barriers used  and Seldinger technique used Catheter size: 9 Fr Total catheter length 10. Central line and PA cath was placed.MAC introducer Swan type:thermodilution PA Cath depth:50 Procedure performed using ultrasound guided technique. Ultrasound Notes:anatomy identified, needle tip was noted to be adjacent to the nerve/plexus identified, no ultrasound evidence of intravascular and/or intraneural injection and image(s) printed for medical record Attempts: 1 Following insertion, line sutured and dressing applied. Post procedure assessment: blood return through all ports, free fluid flow and no air  Patient tolerated the procedure well with no immediate complications.

## 2016-12-19 NOTE — H&P (Signed)
[address]       [place] [address]             [phone]                    Kazuki H Magos Hollis Medical Record [id number] Date of Birth: 1946/04/11  Referring: Dr.  Irish Lack Primary cardiologist: Dr Luther Parody Primary Care: Lujean Amel, MD  Chief Complaint:    Angina   History of Present Illness:    Andrew Williamson 71 y.o. male is seen in the office for coronary artery disease. He was recently seen by Dr. Candee Furbish  for evaluation of chest pain.  The chest pain occurred  while riding a stationary bike but resolved   after he stopped eating before exercise.  ETT-Myoview was done, abnormal with possible inferolateral ischemia. Recent cardiac cath was done.     He  denies rest symptoms. He denies orthopnea, PND or edema. He denies significant dyspnea. He denies syncope.   He has no previous cardiac history. He has hx of HTN, DM2, HL, prostate cancer status post radical prostatectomy in 9/16  Patient has dx of spinal stenosis, this has slowed him down physically for past year, He has had back  injections x2 and started riding stational bike when symptoms started   Current Activity/ Functional Status:  Patient is independent with mobility/ambulation, transfers, ADL's, IADL's.   Zubrod Score: At the time of surgery this patient's most appropriate activity status/level should be described as: []     0    Normal activity, no symptoms [x]     1    Restricted in physical strenuous activity but ambulatory, able to do out light work []     2    Ambulatory and capable of self care, unable to do work activities, up and about               >50 % of waking hours                              []     3    Only limited self care, in bed greater than 50% of waking hours []     4    Completely disabled, no self care, confined to bed or chair []     5    Moribund   Past Medical History:  Diagnosis Date  . Allergic rhinitis   . Coronary artery disease   .  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 DJD (degenerative joint disease)    hips  . ED (erectile dysfunction)   . GERD (gastroesophageal reflux disease)   . Gynecomastia, male   . History of echocardiogram    Echo 12/17: EF 55-60, normal wall motion, grade 1 diastolic dysfunction  . History of nuclear stress test    Myoview 12/17: EF 55, inf-lateral ischemia; intermediate risk  . HLD (hyperlipidemia)    hx of ? elevated LFTs on statin Rx  . Hypertension   . Obesity   . Prostate cancer [facility])    s/p radical prostatectomy  . Spinal stenosis     Past Surgical History:  Procedure Laterality Date  . CARDIAC CATHETERIZATION N/A 12/05/2016   Procedure: Left Heart Cath and Coronary Angiography;  Surgeon: Jettie Booze, MD;  Location: [facility];  Service: Cardiovascular;  Laterality: N/A;  . CARDIAC CATHETERIZATION N/A 12/05/2016  Procedure: Intravascular Ultrasound/IVUS;  Surgeon: Jettie Booze, MD;  Location: [facility];  Service: Cardiovascular;  Laterality: N/A;  . COLONOSCOPY  06/2014   FU 10 years  . LAPAROSCOPIC CHOLECYSTECTOMY     2012  . ROBOT ASSISTED LAPAROSCOPIC RADICAL PROSTATECTOMY N/A 08/11/2015   Procedure: ROBOTIC ASSISTED LAPAROSCOPIC RADICAL PROSTATECTOMY;  Surgeon: Irine Seal, MD;  Location: [facility];  Service: Urology;  Laterality: N/A;    Family History  Problem Relation Age of Onset  . Heart attack Mother 77  . Heart failure Father 30    cardiac arrest during surgery >> CHF  . Peripheral Artery Disease Father   . Heart attack Maternal Uncle     Social History   Social History  . Marital status: Widowed    Spouse name: N/A  . Number of children: N/A  . Years of education: N/A    Social History Main Topics  . Smoking status: Never Smoker  . Smokeless tobacco: Current User    Types: Chew  . Alcohol use Yes     Comment: rarely  . Drug use: No  . Sexual activity: Not on file   Other Topics  Concern  . 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 Never Smoker  Smokeless Tobacco  . Current User  . Types: Chew    History  Alcohol Use  . Yes    Comment: rarely     Allergies  Allergen Reactions  . Thimerosal Other (See Comments)    Red eyes     Current Facility-Administered Medications  Medication Dose Route Frequency Provider Last Rate Last Dose  . cefUROXime (ZINACEF) 1.5 g in dextrose 5 % 50 mL IVPB  1.5 g Intravenous To OR [name], MD      . cefUROXime (ZINACEF) 750 mg in dextrose 5 % 50 mL IVPB  750 mg Intravenous To OR [name], MD      . chlorhexidine (HIBICLENS) 4 % liquid 2 application  30 mL Topical UD [name], MD      . dexmedetomidine (PRECEDEX) 400 MCG/100ML (4 mcg/mL) infusion  0.1-0.7 mcg/kg/hr Intravenous To OR [name], MD      . DOPamine (INTROPIN) 800 mg in dextrose 5 % 250 mL (3.2 mg/mL) infusion  0-10 mcg/kg/min Intravenous To OR [name], MD      . EPINEPHrine (ADRENALIN) 4 mg in dextrose 5 % 250 mL (0.016 mg/mL) infusion  0-10 mcg/min Intravenous To OR [name], MD      . heparin 2,500 Units, papaverine 30 mg in electrolyte-148 (PLASMALYTE-148) 500 mL irrigation   Irrigation To OR [name], MD      . heparin 30,000 units/NS 1000 mL solution for CELLSAVER   Other To OR [name], MD      . insulin regular (NOVOLIN R,HUMULIN R) 250 Units in sodium chloride 0.9 % 250 mL (1 Units/mL) infusion   Intravenous To OR [name], MD      . magnesium sulfate (IV Push/IM) injection 40 mEq  40 mEq Other To OR [name], MD      . nitroGLYCERIN 50 mg in dextrose 5 %  250 mL (0.2 mg/mL) infusion  2-200 mcg/min Intravenous To OR [name], MD      . phenylephrine (NEO-SYNEPHRINE) 20 mg in dextrose 5 % 250 mL (0.08 mg/mL) infusion  30-200 mcg/min Intravenous To OR [name], MD      . potassium chloride injection 80 mEq  80 mEq Other To OR [name], MD      . tranexamic acid (CYKLOKAPRON) 2,500 mg in sodium chloride 0.9 % 250 mL (10 mg/mL) infusion  1.5 mg/kg/hr Intravenous To OR [name], MD      . tranexamic acid (CYKLOKAPRON) bolus via infusion - over 30 minutes 1,401 mg  15 mg/kg Intravenous To OR [name], MD      . tranexamic acid (CYKLOKAPRON) pump prime solution 187 mg  2 mg/kg Intracatheter To OR [name], MD      . vancomycin (VANCOCIN) 1,500 mg in sodium chloride 0.9 % 250 mL IVPB  1,500 mg Intravenous To OR [name], MD         Review of Systems:     Cardiac Review of Systems: Y or N  Chest Pain [ y with exercise   ]  Resting SOB [ n  ] Exertional SOB  [ n ]  Orthopnea [ n ]   Pedal Edema [ n  ]    Palpitations [ n ] Syncope  [ n ]   Presyncope [n   ]  General Review of Systems: [Y] = yes [  ]=no Constitional: recent weight change [n  ];  Wt loss over the last 3 months [   ] anorexia [  ]; fatigue Blue.Reese  ]; nausea [  ]; night sweats [  ]; fever [  ]; or chills [  ];          Dental: poor dentition[n  ]; Last Dentist visit:   Eye : blurred vision [n  ]; diplopia [   ]; vision changes [  ];  Amaurosis fugax[n  ]; Resp: cough [n  ];  wheezing[  n];  hemoptysis[n  ]; shortness of breath[n  ]; paroxysmal nocturnal dyspnea[  ]; dyspnea on exertion[  ]; or orthopnea[  ];  GI:  gallstones[  ], vomiting[  ];  dysphagia[  ]; melena[  ];  hematochezia [  ]; heartburn[  ];   Hx of  Colonoscopy[y  ]; GU: kidney stones [  n]; hematuria[  ];   dysuria [n  ];  nocturia[  ];  history of     obstruction [  ]; urinary frequency Blue.Reese  ]             Skin: rash, swelling[  ];, hair loss[  ];  peripheral edema[  ];  or itching[  ]; Musculosketetal: myalgias[n  ];  joint swelling[nn   ];  joint erythema[  ];  joint pain[  ];  back pain[  ];  Heme/Lymph: bruising[  n];  bleeding[  ];  anemia[n  ];  Neuro: TIA[  ];  headaches[  ];  stroke[  ];  vertigo[  ];  seizures[  ];   paresthesias[  ];  difficulty walking[  ];  Psych:depression[  ]; anxiety[  ];  Endocrine: diabetes[  ];  thyroid dysfunction[  ];  Immunizations: Flu up to date [ y ]; Pneumococcal up to date Blue.Reese  ];  Other:  Physical Exam: BP (!) 145/68   Pulse 84   Temp 98 F (  36.7 C) (Oral)   Resp 20   Ht 5\' 9"  (1.753 m)   Wt 206 lb (93.4 kg)   SpO2 98%   BMI 30.42 kg/m   PHYSICAL EXAMINATION: General appearance: alert, cooperative, appears older than stated age and no distress Head: Normocephalic, without obvious abnormality, atraumatic Neck: no adenopathy, no carotid bruit, no JVD, supple, symmetrical, trachea midline and thyroid not enlarged, symmetric, no tenderness/mass/nodules Lymph nodes: Cervical, supraclavicular, and axillary nodes normal. Resp: clear to auscultation bilaterally Back: symmetric, mild curvature. ROM normal. No CVA tenderness. Cardio: regular rate and rhythm, S1, S2 normal, no murmur, click, rub or gallop GI: soft, non-tender; bowel sounds normal; no masses,  no organomegaly Extremities: extremities normal, atraumatic, no cyanosis or edema and Homans sign is negative, no sign of DVT Neurologic: Grossly normal  Diagnostic Studies & Laboratory data:     Recent Radiology Findings:  No chest xray   Recent Lab Findings: Lab Results  Component Value Date   WBC 9.6 12/17/2016   HGB 13.2 12/17/2016   HCT 38.4 (L) 12/17/2016   PLT 206 12/17/2016   GLUCOSE 201 (H) 12/17/2016   ALT 19 12/17/2016   AST 20 12/17/2016   NA 141 12/17/2016   K 3.8 12/17/2016   CL 106 12/17/2016   CREATININE 1.27 (H) 12/17/2016   BUN 27 (H) 12/17/2016   CO2 25 12/17/2016   INR 1.01 12/17/2016   HGBA1C 6.4 (H) 12/17/2016   Chronic Kidney Disease   Stage I     GFR >90  Stage II    GFR  60-89  Stage IIIA GFR 45-59  Stage IIIB GFR 30-44  Stage IV   GFR 15-29  Stage V    GFR  <15  Lab Results  Component Value Date   CREATININE 1.27 (H) 12/17/2016   Estimated Creatinine Clearance: 61.1 mL/min (by C-G formula based on SCr of 1.27 mg/dL (H)).  Mild cr elevation 0 .9 last year ,  Dec 2107 1.22  CATH:  The left ventricular systolic function is normal.  LV end diastolic pressure is normal.  The left ventricular ejection fraction is 55-65% by visual estimate.  There is no aortic valve stenosis.  Ost LM to LM lesion, 50 %stenosed. Significant by IVUS which showed ostial Left main to have a luminal area of 4.5 mm2. Significant catheter dampening with 6 Fr Guide.  Proximal Cx lesion, 95 %stenosed just after ramus takeoff.  Ost LAD to Prox LAD lesion, 40 %stenosed. Moderate disease in ostial ramus.  Mid Cx lesion, 95 %stenosed.  Flowsheet Row Most Recent Value  AO Systolic Pressure [id number] mmHg  AO Diastolic Pressure 58 mmHg  AO Mean 82 mmHg  LV Systolic Pressure 123456 mmHg  LV Diastolic Pressure 5 mmHg  LV EDP 12 mmHg  Arterial Occlusion Pressure Extended Systolic Pressure [id number] mmHg  Arterial Occlusion Pressure Extended Diastolic Pressure 60 mmHg  Arterial Occlusion Pressure Extended Mean Pressure 85 mmHg  Left Ventricular Apex Extended Systolic Pressure [id number] mmHg  Left Ventricular Apex Extended Diastolic Pressure 5 mmHg  Left Ventricular Apex Extended EDP Pressure 15 mmHg      Given multivessel CAD including bifurcation disease at the left main, will plan for cardiac surgery consultation.  He does not have angina with regular household activities.  He had minimal angina with the stress test, now on Imdur.  He lost his SL NTG so I called in another prescription.  Plan for outpatient surgical consultation.    ECHO:  [facility]  [address] [place], [place] [address]                             [phone]  ------------------------------------------------------------------- Transthoracic Echocardiography  Patient:    Kolyn, Luebbert MR #:       [id number] Study Date: 11/13/2016 Gender:     M Age:        7 Height:     175.3 cm Weight:     95.6 kg BSA:        2.19 m^2 Pt. Status: Room:   ATTENDING    Alto Denver, Scott T  REFERRING    [name] T  PERFORMING   [facility], Outpatient  SONOGRAPHER  [facility], RDCS  cc:  ------------------------------------------------------------------- LV EF: 55% -   60%  ------------------------------------------------------------------- Indications:      Chest Pain (R07.9).  ------------------------------------------------------------------- History:   Risk factors:  Family history of coronary artery disease. Hypertension. Diabetes mellitus. Dyslipidemia.  ------------------------------------------------------------------- Study Conclusions  - Left ventricle: The cavity size was normal. Wall thickness was   normal. Systolic function was normal. The estimated ejection   fraction was in the range of 55% to 60%. Wall motion was normal;   there were no regional wall motion abnormalities. Doppler   parameters are consistent with abnormal left ventricular   relaxation (grade 1 diastolic dysfunction).  Impressions:  - Normal LV systolic function; grade 1 diastolic dysfunction.  ------------------------------------------------------------------- Study data:  No prior study was available for comparison.  Study status:  Routine.  Procedure:  Transthoracic echocardiography. Image quality was adequate.          Transthoracic echocardiography.  M-mode, complete 2D, spectral Doppler, and color Doppler.  Birthdate:  Patient birthdate: 09/21/1946.  Age:  Patient is 71 yr old.  Sex:  Gender: male.    BMI: 31.1 kg/m^2.  Blood pressure:     130/60  Patient status:  Outpatient.  Study  date: Study date: 11/13/2016. Study time: 08:56 AM.  Location:  [facility]  -------------------------------------------------------------------  ------------------------------------------------------------------- Left ventricle:  The cavity size was normal. Wall thickness was normal. Systolic function was normal. The estimated ejection fraction was in the range of 55% to 60%. Wall motion was normal; there were no regional wall motion abnormalities. Doppler parameters are consistent with abnormal left ventricular relaxation (grade 1 diastolic dysfunction).  ------------------------------------------------------------------- Aortic valve:   Trileaflet; mildly thickened leaflets. Mobility was not restricted.  Doppler:  Transvalvular velocity was within the normal range. There was no stenosis. There was no regurgitation.   ------------------------------------------------------------------- Aorta:  Aortic root: The aortic root was normal in size.  ------------------------------------------------------------------- Mitral valve:   Structurally normal valve.   Mobility was not restricted.  Doppler:  Transvalvular velocity was within the normal range. There was no evidence for stenosis. There was no regurgitation.    Peak gradient (D): 2 mm Hg.  ------------------------------------------------------------------- Left atrium:  The atrium was normal in size.  ------------------------------------------------------------------- Right ventricle:  The cavity size was normal. Systolic function was normal.  ------------------------------------------------------------------- Pulmonic valve:    Doppler:  Transvalvular velocity was within the normal range. There was no evidence for stenosis.  ------------------------------------------------------------------- Tricuspid valve:   Structurally normal valve.    Doppler: Transvalvular velocity was within the normal range. There  was trivial  regurgitation.  ------------------------------------------------------------------- Right atrium:  The atrium was normal in size.  ------------------------------------------------------------------- Pericardium:  There was no pericardial effusion.  ------------------------------------------------------------------- Systemic veins: Inferior vena cava: The vessel was normal in size. The respirophasic diameter changes were in the normal range (= 50%), consistent with normal central venous pressure. Diameter: 9.61 mm.  ------------------------------------------------------------------- Measurements   IVC                                   Value        Reference  ID                                    9.61  mm     ---------    Left ventricle                        Value        Reference  LV ID, ED, PLAX chordal        (L)    37.7  mm     43 - 52  LV ID, ES, PLAX chordal               23.3  mm     23 - 38  LV fx shortening, PLAX chordal        38    %      >=29  LV PW thickness, ED                   9.35  mm     ---------  IVS/LV PW ratio, ED                   1            <=1.3  Stroke volume, 2D                     98    ml     ---------  Stroke volume/bsa, 2D                 45    ml/m^2 ---------  LV e&', lateral                        9.32  cm/s   ---------  LV E/e&', lateral                      8.27         ---------  LV e&', medial                         5.98  cm/s   ---------  LV E/e&', medial                       12.89        ---------  LV e&', average                        7.65  cm/s   ---------  LV E/e&', average                      10.08        ---------  Longitudinal strain,  TDI              13    %      ---------    Ventricular septum                    Value        Reference  IVS thickness, ED                     9.36  mm     ---------    LVOT                                  Value        Reference  LVOT ID, S                            24    mm      ---------  LVOT area                             4.52  cm^2   ---------  LVOT peak velocity, S                 92.4  cm/s   ---------  LVOT mean velocity, S                 60.8  cm/s   ---------  LVOT VTI, S                           21.6  cm     ---------    Aorta                                 Value        Reference  Aortic root ID, ED                    34    mm     ---------  Ascending aorta ID, A-P, S            34    mm     ---------    Left atrium                           Value        Reference  LA ID, A-P, ES                        30    mm     ---------  LA ID/bsa, A-P                        1.37  cm/m^2 <=2.2  LA volume, S                          35.8  ml     ---------  LA volume/bsa, S                      16.4  ml/m^2 ---------  LA volume, ES, 1-p A4C  34.1  ml     ---------  LA volume/bsa, ES, 1-p A4C            15.6  ml/m^2 ---------  LA volume, ES, 1-p A2C                35.6  ml     ---------  LA volume/bsa, ES, 1-p A2C            16.3  ml/m^2 ---------    Mitral valve                          Value        Reference  Mitral E-wave peak velocity           77.1  cm/s   ---------  Mitral A-wave peak velocity           94.1  cm/s   ---------  Mitral deceleration time       (H)    282   ms     150 - 230  Mitral peak gradient, D               2     mm Hg  ---------  Mitral E/A ratio, peak                0.8          ---------    Right ventricle                       Value        Reference  TAPSE                                 20.2  mm     ---------  RV s&', lateral, S                     13.7  cm/s   ---------  Legend: (L)  and  (H)  mark values outside specified reference range.  ------------------------------------------------------------------- Prepared and Electronically Authenticated by  Kirk Ruths  Myoview: Study Highlights    Nuclear stress EF: 55%.  Blood pressure demonstrated a normal response to exercise.  There was 26mm of  J point depression with upsloping ST segments in the inferolateral leads  There is a small defect of moderate severity present in the basal inferolateral and mid inferolateral location. The defect is partially reversible. This is worrisome for ischemia.  This is an intermediate risk study.  The left ventricular ejection fraction is normal (55-65%).      Assessment / Plan:   CAD with exercise induced angina in patient with mild renal insufficiency, DM and post ive stress test. I have recommended proceeding with CABG as best treatment options with critical circumflex and intermediate cad and 50 % left main. Risks and options have been discussed in detail. Patient is agreeable with proceeding.  Stage II    GFR 60-89 ckd  diabeties mellitis with complication of coronary artry disease and ckd  The goals risks and alternatives of the planned surgical procedure CABG  have been discussed with the patient in detail. The risks of the procedure including death, infection, stroke, myocardial infarction, bleeding, blood transfusion have all been discussed specifically.  I have quoted [name] a 3 % of perioperative mortality and a complication rate as high as 30 %.  The patient's questions have been answered.[name] is willing  to proceed with the planned procedure.   [name] MD      [address] [place] [address] Office [phone]   Beeper [phone]  12/19/2016 7:12 AM

## 2016-12-19 NOTE — Progress Notes (Signed)
[facility] BRIEF SICU PROGRESS NOTE  Day of Surgery  S/P Procedure(s) (LRB): CORONARY ARTERY BYPASS GRAFTING (CABG) x three , using left internal mammary artery and right leg greater saphenous vein harvested endoscopically; SVG to Ramus, SVG to circ, LIMA to LAD  (N/A) TRANSESOPHAGEAL ECHOCARDIOGRAM (TEE) (N/A)   Waking up on vent NSR w/ stable hemodynamics, on NTG for HTN O2 sats 100% Chest tube output low UOP adequate Labs okay  Plan: Continue routine early postop  Rexene Alberts, MD 12/19/2016 7:25 PM

## 2016-12-19 NOTE — Progress Notes (Signed)
  Echocardiogram Echocardiogram Transesophageal has been performed.  Darlina Sicilian M 12/19/2016, 9:44 AM

## 2016-12-20 ENCOUNTER — Encounter (HOSPITAL_COMMUNITY): Payer: Self-pay | Admitting: Cardiothoracic Surgery

## 2016-12-20 ENCOUNTER — Inpatient Hospital Stay (HOSPITAL_COMMUNITY): Payer: Medicare Other

## 2016-12-20 LAB — CBC
HCT: 37.4 % — ABNORMAL LOW (ref 39.0–52.0)
HEMATOCRIT: 34.9 % — AB (ref 39.0–52.0)
HEMATOCRIT: 37.4 % — AB (ref 39.0–52.0)
HEMOGLOBIN: 12 g/dL — AB (ref 13.0–17.0)
HEMOGLOBIN: 12.7 g/dL — AB (ref 13.0–17.0)
Hemoglobin: 12.9 g/dL — ABNORMAL LOW (ref 13.0–17.0)
MCH: 30.6 pg (ref 26.0–34.0)
MCH: 31 pg (ref 26.0–34.0)
MCH: 30.8 pg (ref 26.0–34.0)
MCHC: 34.4 g/dL (ref 30.0–36.0)
MCHC: 34.5 g/dL (ref 30.0–36.0)
MCHC: 34 g/dL (ref 30.0–36.0)
MCV: 89 fL (ref 78.0–100.0)
MCV: 89.9 fL (ref 78.0–100.0)
MCV: 90.8 fL (ref 78.0–100.0)
Platelets: 148 10*3/uL — ABNORMAL LOW (ref 150–400)
Platelets: 137 10*3/uL — ABNORMAL LOW (ref 150–400)
Platelets: 137 10*3/uL — ABNORMAL LOW (ref 150–400)
RBC: 3.92 MIL/uL — ABNORMAL LOW (ref 4.22–5.81)
RBC: 4.16 MIL/uL — ABNORMAL LOW (ref 4.22–5.81)
RBC: 4.12 MIL/uL — ABNORMAL LOW (ref 4.22–5.81)
RDW: 13 % (ref 11.5–15.5)
RDW: 13.4 % (ref 11.5–15.5)
RDW: 13.6 % (ref 11.5–15.5)
WBC: 13.5 10*3/uL — ABNORMAL HIGH (ref 4.0–10.5)
WBC: 14.9 10*3/uL — ABNORMAL HIGH (ref 4.0–10.5)
WBC: 14.1 10*3/uL — ABNORMAL HIGH (ref 4.0–10.5)

## 2016-12-20 LAB — CREATININE, SERUM
Creatinine, Ser: 1.08 mg/dL (ref 0.61–1.24)
Creatinine, Ser: 1.01 mg/dL (ref 0.61–1.24)
GFR calc Af Amer: >60 mL/min (ref >60)
GFR calc Af Amer: >60 mL/min (ref >60)
GFR calc non Af Amer: >60 mL/min (ref >60)
GFR calc non Af Amer: >60 mL/min (ref >60)

## 2016-12-20 LAB — GLUCOSE, CAPILLARY
Glucose-Capillary: 101 mg/dL — ABNORMAL HIGH (ref 65–99)
Glucose-Capillary: 119 mg/dL — ABNORMAL HIGH (ref 65–99)
Glucose-Capillary: 122 mg/dL — ABNORMAL HIGH (ref 65–99)
Glucose-Capillary: 124 mg/dL — ABNORMAL HIGH (ref 65–99)
Glucose-Capillary: 125 mg/dL — ABNORMAL HIGH (ref 65–99)
Glucose-Capillary: 126 mg/dL — ABNORMAL HIGH (ref 65–99)
Glucose-Capillary: 132 mg/dL — ABNORMAL HIGH (ref 65–99)
Glucose-Capillary: 144 mg/dL — ABNORMAL HIGH (ref 65–99)
Glucose-Capillary: 146 mg/dL — ABNORMAL HIGH (ref 65–99)
Glucose-Capillary: 152 mg/dL — ABNORMAL HIGH (ref 65–99)
Glucose-Capillary: 184 mg/dL — ABNORMAL HIGH (ref 65–99)
Glucose-Capillary: 202 mg/dL — ABNORMAL HIGH (ref 65–99)
Glucose-Capillary: 99 mg/dL (ref 65–99)

## 2016-12-20 LAB — BASIC METABOLIC PANEL
Anion gap: 7 (ref 5–15)
BUN: 15 mg/dL (ref 6–20)
CHLORIDE: 106 mmol/L (ref 101–111)
CO2: 23 mmol/L (ref 22–32)
CREATININE: 0.95 mg/dL (ref 0.61–1.24)
Calcium: 7.9 mg/dL — ABNORMAL LOW (ref 8.9–10.3)
GFR calc Af Amer: >60 mL/min (ref >60)
GFR calc non Af Amer: >60 mL/min (ref >60)
GLUCOSE: 136 mg/dL — AB (ref 65–99)
Potassium: 3.9 mmol/L (ref 3.5–5.1)
Sodium: 136 mmol/L (ref 135–145)

## 2016-12-20 LAB — MAGNESIUM
Magnesium: 2.3 mg/dL (ref 1.7–2.4)
Magnesium: 2.3 mg/dL (ref 1.7–2.4)

## 2016-12-20 LAB — POCT I-STAT, CHEM 8
BUN: 15 mg/dL (ref 6–20)
Calcium, Ion: 1.15 mmol/L (ref 1.15–1.40)
Chloride: 99 mmol/L — ABNORMAL LOW (ref 101–111)
Creatinine, Ser: 0.9 mg/dL (ref 0.61–1.24)
Glucose, Bld: 188 mg/dL — ABNORMAL HIGH (ref 65–99)
HCT: 37 % — ABNORMAL LOW (ref 39.0–52.0)
Hemoglobin: 12.6 g/dL — ABNORMAL LOW (ref 13.0–17.0)
Potassium: 4 mmol/L (ref 3.5–5.1)
Sodium: 137 mmol/L (ref 135–145)
TCO2: 25 mmol/L (ref 0–100)

## 2016-12-20 MED ORDER — ENOXAPARIN SODIUM 30 MG/0.3ML ~~LOC~~ SOLN
30.0000 mg | Freq: Every day | SUBCUTANEOUS | Status: DC
  Administered 2016-12-20: 30 mg via SUBCUTANEOUS
  Filled 2016-12-20: qty 0.3

## 2016-12-20 MED ORDER — INSULIN ASPART 100 UNIT/ML ~~LOC~~ SOLN
0.0000 [IU] | SUBCUTANEOUS | Status: DC
  Administered 2016-12-20: 2 [IU] via SUBCUTANEOUS
  Administered 2016-12-20: 4 [IU] via SUBCUTANEOUS
  Administered 2016-12-20: 8 [IU] via SUBCUTANEOUS

## 2016-12-20 MED ORDER — FUROSEMIDE 10 MG/ML IJ SOLN
20.0000 mg | Freq: Two times a day (BID) | INTRAMUSCULAR | Status: DC
  Administered 2016-12-20: 20 mg via INTRAVENOUS
  Filled 2016-12-20: qty 2

## 2016-12-20 MED ORDER — INSULIN DETEMIR 100 UNIT/ML ~~LOC~~ SOLN
20.0000 [IU] | Freq: Every day | SUBCUTANEOUS | Status: DC
  Administered 2016-12-20 – 2016-12-21 (×2): 20 [IU] via SUBCUTANEOUS
  Filled 2016-12-20 (×3): qty 0.2

## 2016-12-20 MED ORDER — INSULIN DETEMIR 100 UNIT/ML ~~LOC~~ SOLN: 20.0000 [IU] | Freq: Every day | SUBCUTANEOUS | Status: DC

## 2016-12-20 NOTE — Progress Notes (Signed)
Patient ID: Andrew Williamson, male   DOB: 11-23-1946, 71 y.o.   MRN: [id number] TCTS DAILY ICU PROGRESS NOTE                   [address]            [place] [address]          [phone]   1 Day Post-Op Procedure(s) (LRB): CORONARY ARTERY BYPASS GRAFTING (CABG) x three , using left internal mammary artery and right leg greater saphenous vein harvested endoscopically; SVG to Ramus, SVG to circ, LIMA to LAD  (N/A) TRANSESOPHAGEAL ECHOCARDIOGRAM (TEE) (N/A)  Total Length of Stay:  LOS: 1 day   Subjective: Extubated, alert and neuro intact  Objective: Vital signs in last 24 hours: Temp:  [95.2 F (35.1 C)-100 F (37.8 C)] 99.3 F (37.4 C) (01/11 0700) Pulse Rate:  [71-97] 88 (01/11 0700) Cardiac Rhythm: Normal sinus rhythm (01/11 0600) Resp:  [12-20] 16 (01/11 0700) BP: (97-133)/(56-91) 129/76 (01/11 0700) SpO2:  [93 %-100 %] 100 % (01/11 0700) Arterial Line BP: (99-169)/(51-103) 150/61 (01/11 0700) FiO2 (%):  [40 %-50 %] 40 % (01/10 1929) Weight:  [96.3 kg (212 lb 4.9 oz)] 96.3 kg (212 lb 4.9 oz) (01/11 0600)  Filed Weights   12/19/16 0640 12/20/16 0600  Weight: 93.4 kg (206 lb) 96.3 kg (212 lb 4.9 oz)    Weight change: 2.859 kg (6 lb 4.9 oz)   Hemodynamic parameters for last 24 hours: PAP: (18-29)/(10-21) 20/11 CO:  [3.5 L/min-7.4 L/min] 5 L/min CI:  [1.7 L/min/m2-3.6 L/min/m2] 2.4 L/min/m2  Intake/Output from previous day: 01/10 0701 - 01/11 0700 In: 4417.5 [I.V.:2692.5; Blood:450; NG/GT:60; IV Piggyback:1215] Out: 3560 [Urine:2230; Emesis/NG output:50; Blood:900; Chest Tube:380]  Intake/Output this shift: No intake/output data recorded.  Current Meds: Scheduled Meds: . acetaminophen  1,000 mg Oral Q6H   Or  . acetaminophen (TYLENOL) oral liquid 160 mg/5 mL  1,000 mg Per Tube Q6H  . aspirin EC  325 mg Oral Daily   Or  . aspirin  324 mg Per Tube Daily  . bisacodyl  10 mg Oral Daily   Or  . bisacodyl  10 mg Rectal Daily  . cefUROXime  (ZINACEF)  IV  1.5 g Intravenous Q12H  . chlorhexidine gluconate (MEDLINE KIT)  15 mL Mouth Rinse BID  . docusate sodium  200 mg Oral Daily  . famotidine (PEPCID) IV  20 mg Intravenous Q12H  . gabapentin  300 mg Oral TID  . insulin regular  0-10 Units Intravenous TID WC  . mouth rinse  15 mL Mouth Rinse QID  . metoCLOPramide (REGLAN) injection  10 mg Intravenous Q6H  . metoprolol tartrate  12.5 mg Oral BID   Or  . metoprolol tartrate  12.5 mg Per Tube BID  . [START ON 12/21/2016] pantoprazole  40 mg Oral Daily  . pravastatin  20 mg Oral q1800  . sodium chloride flush  3 mL Intravenous Q12H   Continuous Infusions: . sodium chloride 20 mL/hr at 12/19/16 1900  . sodium chloride    . sodium chloride 20 mL/hr at 12/19/16 2100  . dexmedetomidine Stopped (12/19/16 1742)  . insulin (NOVOLIN-R) infusion Stopped (12/19/16 1800)  . lactated ringers 20 mL/hr at 12/19/16 1900  . lactated ringers 20 mL/hr at 12/19/16 1800  . nitroGLYCERIN Stopped (12/20/16 0400)  . phenylephrine (NEO-SYNEPHRINE) Adult infusion Stopped (12/19/16 1415)   PRN Meds:.sodium chloride, albumin human, labetalol, lactated ringers, metoprolol, midazolam, morphine injection, ondansetron (ZOFRAN) IV,  oxyCODONE, sodium chloride flush, traMADol  General appearance: alert, cooperative and no distress Neurologic: intact Heart: regular rate and rhythm, S1, S2 normal, no murmur, click, rub or gallop Lungs: diminished breath sounds bibasilar Abdomen: soft, non-tender; bowel sounds normal; no masses,  no organomegaly Extremities: extremities normal, atraumatic, no cyanosis or edema and Homans sign is negative, no sign of DVT Wound: sternum intact  Lab Results: CBC: Recent Labs  12/19/16 1950 12/19/16 1953 12/20/16 0422  WBC 13.6*  --  13.5*  HGB 11.9* 10.5* 12.0*  HCT 34.2* 31.0* 34.9*  PLT 121*  --  148*   BMET:  Recent Labs  12/17/16 1350  12/19/16 1953 12/20/16 0422  NA 141  < > 135 136  K 3.8  < > 4.4 3.9    CL 106  < > 107 106  CO2 25  --   --  23  GLUCOSE 201*  < > 183* 136*  BUN 27*  < > 18 15  CREATININE 1.27*  < > 0.80 0.95  CALCIUM 9.4  --   --  7.9*  < > = values in this interval not displayed.  CMET: Lab Results  Component Value Date   WBC 13.5 (H) 12/20/2016   HGB 12.0 (L) 12/20/2016   HCT 34.9 (L) 12/20/2016   PLT 148 (L) 12/20/2016   GLUCOSE 136 (H) 12/20/2016   ALT 19 12/17/2016   AST 20 12/17/2016   NA 136 12/20/2016   K 3.9 12/20/2016   CL 106 12/20/2016   CREATININE 0.95 12/20/2016   BUN 15 12/20/2016   CO2 23 12/20/2016   INR 1.28 12/19/2016   HGBA1C 6.4 (H) 12/17/2016    PT/INR:  Recent Labs  12/19/16 1440  LABPROT 16.0*  INR 1.28   Radiology: Dg Chest Port 1 View  Result Date: 12/20/2016 CLINICAL DATA:  Chest tube. EXAM: PORTABLE CHEST 1 VIEW COMPARISON:  12/19/2016. FINDINGS: Interim extubation removal of NG tube. Swan-Ganz catheter left chest tube in stable position. No pneumothorax. Prior CABG. Heart size stable. Low lung volumes with basilar atelectasis again noted without interim change. No prominent pleural effusion. IMPRESSION: 1. Interim extubation and removal of NG tube. Swan-Ganz catheter and left chest tube in stable position. No pneumothorax. 2.  Stable bibasilar atelectasis. 3. Prior CABG.  Heart size stable. Electronically Signed   By: Marcello Moores  Register   On: 12/20/2016 07:14   Dg Chest Port 1 View  Result Date: 12/19/2016 CLINICAL DATA:  Postop CABG. EXAM: PORTABLE CHEST 1 VIEW COMPARISON:  12/19/2016 and 12/27/2016. FINDINGS: 1519 hours. Endotracheal tube tip remains in the mid trachea. Nasogastric tube projects below the diaphragm. Right IJ Swan-Ganz catheter tip in the right interlobar pulmonary artery. Lateral left chest tube is unchanged in position. The heart size and mediastinal contours are stable. There is improved aeration of both lung bases. No pneumothorax or significant pleural effusion identified. IMPRESSION: Improving bibasilar  aeration. No pneumothorax. Stable support system. Electronically Signed   By: Richardean Sale M.D.   On: 12/19/2016 15:31   Dg Chest Port 1 View  Result Date: 12/19/2016 CLINICAL DATA:  Needle count off. EXAM: PORTABLE CHEST 1 VIEW COMPARISON:  PA and lateral chest x-ray December 17, 2016 FINDINGS: The patient is undergone CABG. No retained needle within the thorax is observed. There are surgical clips in the gallbladder fossa. The lungs are hypoinflated. There is an azygos lobe anatomy on the right. There is no pneumothorax. The left-sided chest tube tip projects over the posterolateral aspect of the  third rib. The heart is normal in size. The pulmonary vascularity is mildly prominent centrally. There is prominence of the azygos vein. The Swan-Ganz catheter tip projects in the distal right main pulmonary artery. The endotracheal tube tip lies approximately 3 cm above the carina. The mediastinal drain lies at approximately T5. IMPRESSION: No evidence of a retained surgical needle within the chest. Post CABG changes with hypoinflation. No pleural effusion, pneumothorax, nor more than minimal pulmonary edema. The support tubes are in reasonable position. This report was called by me to [facility] in the operating room at 2:25 p.m. on 19 December 2016. Electronically Signed   By: David  Martinique M.D.   On: 12/19/2016 14:30     Assessment/Plan: S/P Procedure(s) (LRB): CORONARY ARTERY BYPASS GRAFTING (CABG) x three , using left internal mammary artery and right leg greater saphenous vein harvested endoscopically; SVG to Ramus, SVG to circ, LIMA to LAD  (N/A) TRANSESOPHAGEAL ECHOCARDIOGRAM (TEE) (N/A) Mobilize Diuresis Diabetes control d/c tubes/lines Continue foley due to strict I&O, patient in ICU and urinary output monitoring See progression orders Expected Acute  Blood - loss Anemia Required 2 prbc on pump , h/h stable now   Renal function stable   [name] 12/20/2016 7:47 AM

## 2016-12-20 NOTE — Care Management Note (Signed)
Case Management Note  Patient Details  Name: Andrew Williamson MRN: [id number] Date of Birth: [birth year]  Subjective/Objective:   S/p CABG                Action/Plan:   PTA independent from home alone - daughters are at bedside and have already made arrangements to stay with dad for recommended supervision.  CM will continue to follow for discharge needs   Expected Discharge Date:                  Expected Discharge Plan:  Home/Self Care  In-House Referral:     Discharge planning Services  CM Consult  Post Acute Care Choice:    Choice offered to:     DME Arranged:    DME Agency:     HH Arranged:    HH Agency:     Status of Service:  In process, will continue to follow  If discussed at Long Length of Stay Meetings, dates discussed:    Additional Comments:  Maryclare Labrador, RN 12/20/2016, 11:04 AM

## 2016-12-20 NOTE — Progress Notes (Signed)
CT Surgery  OOB in chair all day nsr U/o decreasing with stable BP - lasix ordered K 4.0. Bun 15 Hct 37

## 2016-12-21 ENCOUNTER — Inpatient Hospital Stay (HOSPITAL_COMMUNITY): Payer: Medicare Other

## 2016-12-21 ENCOUNTER — Ambulatory Visit: Payer: Medicare Other | Admitting: Physician Assistant

## 2016-12-21 LAB — BASIC METABOLIC PANEL
Anion gap: 5 (ref 5–15)
BUN: 11 mg/dL (ref 6–20)
CO2: 29 mmol/L (ref 22–32)
Calcium: 8.2 mg/dL — ABNORMAL LOW (ref 8.9–10.3)
Chloride: 106 mmol/L (ref 101–111)
Creatinine, Ser: 1 mg/dL (ref 0.61–1.24)
GFR calc Af Amer: >60 mL/min (ref >60)
GFR calc non Af Amer: >60 mL/min (ref >60)
Glucose, Bld: 102 mg/dL — ABNORMAL HIGH (ref 65–99)
Potassium: 3.7 mmol/L (ref 3.5–5.1)
Sodium: 140 mmol/L (ref 135–145)

## 2016-12-21 LAB — GLUCOSE, CAPILLARY
Glucose-Capillary: 207 mg/dL — ABNORMAL HIGH (ref 65–99)
Glucose-Capillary: 99 mg/dL (ref 65–99)
Glucose-Capillary: 112 mg/dL — ABNORMAL HIGH (ref 65–99)
Glucose-Capillary: 188 mg/dL — ABNORMAL HIGH (ref 65–99)
Glucose-Capillary: 166 mg/dL — ABNORMAL HIGH (ref 65–99)
Glucose-Capillary: 177 mg/dL — ABNORMAL HIGH (ref 65–99)

## 2016-12-21 LAB — CBC
HCT: 35 % — ABNORMAL LOW (ref 39.0–52.0)
Hemoglobin: 11.8 g/dL — ABNORMAL LOW (ref 13.0–17.0)
MCH: 30.7 pg (ref 26.0–34.0)
MCHC: 33.7 g/dL (ref 30.0–36.0)
MCV: 91.1 fL (ref 78.0–100.0)
Platelets: 133 10*3/uL — ABNORMAL LOW (ref 150–400)
RBC: 3.84 MIL/uL — ABNORMAL LOW (ref 4.22–5.81)
RDW: 13.3 % (ref 11.5–15.5)
WBC: 12.4 10*3/uL — ABNORMAL HIGH (ref 4.0–10.5)

## 2016-12-21 MED ORDER — SODIUM CHLORIDE 0.9 % IV SOLN
30.0000 meq | Freq: Once | INTRAVENOUS | Status: AC
  Administered 2016-12-21: 30 meq via INTRAVENOUS
  Filled 2016-12-21: qty 15

## 2016-12-21 MED ORDER — POTASSIUM CHLORIDE CRYS ER 10 MEQ PO TBCR
10.0000 meq | EXTENDED_RELEASE_TABLET | Freq: Every day | ORAL | Status: DC
  Administered 2016-12-21 – 2016-12-22 (×2): 10 meq via ORAL
  Filled 2016-12-21 (×2): qty 1

## 2016-12-21 MED ORDER — BISACODYL 5 MG PO TBEC: 10.0000 mg | DELAYED_RELEASE_TABLET | Freq: Every day | ORAL | Status: DC | PRN

## 2016-12-21 MED ORDER — BISACODYL 10 MG RE SUPP: 10.0000 mg | Freq: Every day | RECTAL | Status: DC | PRN

## 2016-12-21 MED ORDER — FUROSEMIDE 20 MG PO TABS
20.0000 mg | ORAL_TABLET | Freq: Every day | ORAL | Status: DC
  Administered 2016-12-21 – 2016-12-22 (×2): 20 mg via ORAL
  Filled 2016-12-21 (×2): qty 1

## 2016-12-21 MED ORDER — ACETAMINOPHEN 325 MG PO TABS: 650.0000 mg | ORAL_TABLET | Freq: Four times a day (QID) | ORAL | Status: DC | PRN

## 2016-12-21 MED ORDER — ONDANSETRON HCL 4 MG/2ML IJ SOLN: 4.0000 mg | Freq: Four times a day (QID) | INTRAMUSCULAR | Status: DC | PRN

## 2016-12-21 MED ORDER — ONDANSETRON HCL 4 MG PO TABS: 4.0000 mg | ORAL_TABLET | Freq: Four times a day (QID) | ORAL | Status: DC | PRN

## 2016-12-21 MED ORDER — METOPROLOL TARTRATE 12.5 MG HALF TABLET
12.5000 mg | ORAL_TABLET | Freq: Two times a day (BID) | ORAL | Status: DC
  Administered 2016-12-21: 12.5 mg via ORAL
  Filled 2016-12-21 (×2): qty 1

## 2016-12-21 MED ORDER — OXYCODONE HCL 5 MG PO TABS: 5.0000 mg | ORAL_TABLET | ORAL | Status: DC | PRN

## 2016-12-21 MED ORDER — ASPIRIN EC 81 MG PO TBEC
81.0000 mg | DELAYED_RELEASE_TABLET | Freq: Every day | ORAL | Status: DC
  Administered 2016-12-21 – 2016-12-24 (×4): 81 mg via ORAL
  Filled 2016-12-21 (×4): qty 1

## 2016-12-21 MED ORDER — METOPROLOL TARTRATE 12.5 MG HALF TABLET
12.5000 mg | ORAL_TABLET | Freq: Three times a day (TID) | ORAL | Status: DC
  Administered 2016-12-21 – 2016-12-22 (×3): 12.5 mg via ORAL
  Filled 2016-12-21 (×2): qty 1

## 2016-12-21 MED ORDER — PANTOPRAZOLE SODIUM 40 MG PO TBEC
40.0000 mg | DELAYED_RELEASE_TABLET | Freq: Every day | ORAL | Status: DC
  Administered 2016-12-21 – 2016-12-24 (×4): 40 mg via ORAL
  Filled 2016-12-21 (×4): qty 1

## 2016-12-21 MED ORDER — SODIUM CHLORIDE 0.9 % IV SOLN: 250.0000 mL | INTRAVENOUS | Status: DC | PRN

## 2016-12-21 MED ORDER — ENOXAPARIN SODIUM 40 MG/0.4ML ~~LOC~~ SOLN
40.0000 mg | Freq: Every day | SUBCUTANEOUS | Status: DC
  Administered 2016-12-21 – 2016-12-23 (×3): 40 mg via SUBCUTANEOUS
  Filled 2016-12-21 (×3): qty 0.4

## 2016-12-21 MED ORDER — TRAMADOL HCL 50 MG PO TABS: 50.0000 mg | ORAL_TABLET | ORAL | Status: DC | PRN

## 2016-12-21 MED ORDER — DOCUSATE SODIUM 100 MG PO CAPS
200.0000 mg | ORAL_CAPSULE | Freq: Every day | ORAL | Status: DC
  Administered 2016-12-21 – 2016-12-24 (×4): 200 mg via ORAL
  Filled 2016-12-21 (×4): qty 2

## 2016-12-21 MED ORDER — SODIUM CHLORIDE 0.9% FLUSH
3.0000 mL | Freq: Two times a day (BID) | INTRAVENOUS | Status: DC
  Administered 2016-12-21 – 2016-12-24 (×5): 3 mL via INTRAVENOUS

## 2016-12-21 MED ORDER — MOVING RIGHT ALONG BOOK
Freq: Once | Status: AC
  Administered 2016-12-21: 08:00:00
  Filled 2016-12-21: qty 1

## 2016-12-21 MED ORDER — SODIUM CHLORIDE 0.9% FLUSH: 3.0000 mL | INTRAVENOUS | Status: DC | PRN

## 2016-12-21 MED ORDER — INSULIN ASPART 100 UNIT/ML ~~LOC~~ SOLN
0.0000 [IU] | Freq: Three times a day (TID) | SUBCUTANEOUS | Status: DC
Start: 2016-12-21 — End: 2016-12-24
  Administered 2016-12-21 (×3): 4 [IU] via SUBCUTANEOUS
  Administered 2016-12-22 – 2016-12-24 (×9): 2 [IU] via SUBCUTANEOUS

## 2016-12-21 MED FILL — Electrolyte-R (PH 7.4) Solution: INTRAVENOUS | Qty: 1000 | Status: AC

## 2016-12-21 NOTE — Progress Notes (Signed)
Patient ID: Andrew Williamson, male   DOB: 11/14/1946, 71 y.o.   MRN: [id number] TCTS DAILY ICU PROGRESS NOTE                   [address]            [place] [address]          [phone]   2 Days Post-Op Procedure(s) (LRB): CORONARY ARTERY BYPASS GRAFTING (CABG) x three , using left internal mammary artery and right leg greater saphenous vein harvested endoscopically; SVG to Ramus, SVG to circ, LIMA to LAD  (N/A) TRANSESOPHAGEAL ECHOCARDIOGRAM (TEE) (N/A)  Total Length of Stay:  LOS: 2 days   Subjective: Alert and awake , walked in unit today already  Objective: Vital signs in last 24 hours: Temp:  [97.4 F (36.3 C)-99.9 F (37.7 C)] 98.6 F (37 C) (01/12 0400) Pulse Rate:  [82-100] 92 (01/12 0600) Cardiac Rhythm: Normal sinus rhythm (01/12 0500) Resp:  [12-20] 16 (01/12 0600) BP: (99-150)/(55-83) 105/61 (01/12 0600) SpO2:  [91 %-100 %] 98 % (01/12 0600) Arterial Line BP: (152-164)/(65-66) 164/65 (01/11 0900) Weight:  [215 lb 13.3 oz (97.9 kg)] 215 lb 13.3 oz (97.9 kg) (01/12 0630)  Filed Weights   12/19/16 0640 12/20/16 0600 12/21/16 0630  Weight: 206 lb (93.4 kg) 212 lb 4.9 oz (96.3 kg) 215 lb 13.3 oz (97.9 kg)    Weight change: 3 lb 8.4 oz (1.6 kg)   Hemodynamic parameters for last 24 hours: PAP: (22-25)/(9-12) 22/9  Intake/Output from previous day: 01/11 0701 - 01/12 0700 In: 874.4 [I.V.:509.4; IV Piggyback:365] Out: [id number] [Urine:3890; Chest Tube:420]  Intake/Output this shift: No intake/output data recorded.  Current Meds: Scheduled Meds: . acetaminophen  1,000 mg Oral Q6H   Or  . acetaminophen (TYLENOL) oral liquid 160 mg/5 mL  1,000 mg Per Tube Q6H  . aspirin EC  325 mg Oral Daily   Or  . aspirin  324 mg Per Tube Daily  . bisacodyl  10 mg Oral Daily   Or  . bisacodyl  10 mg Rectal Daily  . docusate sodium  200 mg Oral Daily  . enoxaparin (LOVENOX) injection  30 mg Subcutaneous QHS  . furosemide  20 mg Intravenous BID  .  gabapentin  300 mg Oral TID  . insulin aspart  0-24 Units Subcutaneous Q4H  . insulin detemir  20 Units Subcutaneous Daily  . metoCLOPramide (REGLAN) injection  10 mg Intravenous Q6H  . metoprolol tartrate  12.5 mg Oral BID   Or  . metoprolol tartrate  12.5 mg Per Tube BID  . pantoprazole  40 mg Oral Daily  . potassium chloride (KCL MULTIRUN) 30 mEq in 265 mL IVPB  30 mEq Intravenous Once  . pravastatin  20 mg Oral q1800  . sodium chloride flush  3 mL Intravenous Q12H   Continuous Infusions: . sodium chloride Stopped (12/20/16 0900)  . sodium chloride    . sodium chloride 20 mL/hr at 12/19/16 2100  . dexmedetomidine Stopped (12/19/16 1742)  . insulin (NOVOLIN-R) infusion Stopped (12/20/16 1130)  . lactated ringers 20 mL/hr at 12/20/16 0800  . lactated ringers 20 mL/hr at 12/19/16 1800  . nitroGLYCERIN Stopped (12/20/16 0400)  . phenylephrine (NEO-SYNEPHRINE) Adult infusion Stopped (12/19/16 1415)   PRN Meds:.sodium chloride, labetalol, lactated ringers, metoprolol, midazolam, morphine injection, ondansetron (ZOFRAN) IV, oxyCODONE, sodium chloride flush, traMADol  General appearance: alert and cooperative Neurologic: intact Heart: regular rate and rhythm, S1, S2 normal, no murmur, click,  rub or gallop Lungs: diminished breath sounds bibasilar Abdomen: soft, non-tender; bowel sounds normal; no masses,  no organomegaly Extremities: extremities normal, atraumatic, no cyanosis or edema and Homans sign is negative, no sign of DVT Wound: intact  Lab Results: CBC: Recent Labs  12/20/16 1650 12/21/16 0504  WBC 14.1* 12.4*  HGB 12.7* 11.8*  HCT 37.4* 35.0*  PLT 137* 133*   BMET:  Recent Labs  12/20/16 0422  12/20/16 1649 12/20/16 1650 12/21/16 0504  NA 136  --  137  --  140  K 3.9  --  4.0  --  3.7  CL 106  --  99*  --  106  CO2 23  --   --   --  29  GLUCOSE 136*  --  188*  --  102*  BUN 15  --  15  --  11  CREATININE 0.95  < > 0.90 1.01 1.00  CALCIUM 7.9*  --   --    --  8.2*  < > = values in this interval not displayed.  CMET: Lab Results  Component Value Date   WBC 12.4 (H) 12/21/2016   HGB 11.8 (L) 12/21/2016   HCT 35.0 (L) 12/21/2016   PLT 133 (L) 12/21/2016   GLUCOSE 102 (H) 12/21/2016   ALT 19 12/17/2016   AST 20 12/17/2016   NA 140 12/21/2016   K 3.7 12/21/2016   CL 106 12/21/2016   CREATININE 1.00 12/21/2016   BUN 11 12/21/2016   CO2 29 12/21/2016   INR 1.28 12/19/2016   HGBA1C 6.4 (H) 12/17/2016    PT/INR:  Recent Labs  12/19/16 1440  LABPROT 16.0*  INR 1.28   Radiology: No results found.   Assessment/Plan: S/P Procedure(s) (LRB): CORONARY ARTERY BYPASS GRAFTING (CABG) x three , using left internal mammary artery and right leg greater saphenous vein harvested endoscopically; SVG to Ramus, SVG to circ, LIMA to LAD  (N/A) TRANSESOPHAGEAL ECHOCARDIOGRAM (TEE) (N/A) Mobilize Diuresis Diabetes control d/c tubes/lines Plan for transfer to step-down: see transfer orders     [name] 12/21/2016 7:32 AM

## 2016-12-21 NOTE — Progress Notes (Signed)
CRITICAL VALUE ALERT  Critical value received:  Call report of CXR showing small left apical pneumo  Date of notification:  12/21/2016  Time of notification:  0810  Critical value read back:Yes.    Nurse who received alert:  Beckie Salts, RN  MD notified (1st page):  Servando Snare  Time of first page:  0815  MD notified (2nd page):  Time of second page:  Responding MD:  Servando Snare  Time MD responded:  [phone]

## 2016-12-22 ENCOUNTER — Inpatient Hospital Stay (HOSPITAL_COMMUNITY): Payer: Medicare Other

## 2016-12-22 LAB — GLUCOSE, CAPILLARY
Glucose-Capillary: 145 mg/dL — ABNORMAL HIGH (ref 65–99)
Glucose-Capillary: 138 mg/dL — ABNORMAL HIGH (ref 65–99)
Glucose-Capillary: 158 mg/dL — ABNORMAL HIGH (ref 65–99)
Glucose-Capillary: 155 mg/dL — ABNORMAL HIGH (ref 65–99)

## 2016-12-22 LAB — CBC
HCT: 38.3 % — ABNORMAL LOW (ref 39.0–52.0)
Hemoglobin: 12.6 g/dL — ABNORMAL LOW (ref 13.0–17.0)
MCH: 30.5 pg (ref 26.0–34.0)
MCHC: 32.9 g/dL (ref 30.0–36.0)
MCV: 92.7 fL (ref 78.0–100.0)
Platelets: 151 10*3/uL (ref 150–400)
RBC: 4.13 MIL/uL — ABNORMAL LOW (ref 4.22–5.81)
RDW: 13.4 % (ref 11.5–15.5)
WBC: 12.1 10*3/uL — ABNORMAL HIGH (ref 4.0–10.5)

## 2016-12-22 LAB — BASIC METABOLIC PANEL
Anion gap: 8 (ref 5–15)
BUN: 17 mg/dL (ref 6–20)
CO2: 25 mmol/L (ref 22–32)
Calcium: 8.6 mg/dL — ABNORMAL LOW (ref 8.9–10.3)
Chloride: 105 mmol/L (ref 101–111)
Creatinine, Ser: 1.24 mg/dL (ref 0.61–1.24)
GFR calc Af Amer: >60 mL/min (ref >60)
GFR calc non Af Amer: 57 mL/min — ABNORMAL LOW (ref >60)
Glucose, Bld: 156 mg/dL — ABNORMAL HIGH (ref 65–99)
Potassium: 3.4 mmol/L — ABNORMAL LOW (ref 3.5–5.1)
Sodium: 138 mmol/L (ref 135–145)

## 2016-12-22 MED ORDER — METOPROLOL TARTRATE 25 MG PO TABS
25.0000 mg | ORAL_TABLET | Freq: Three times a day (TID) | ORAL | Status: DC
  Administered 2016-12-22 – 2016-12-24 (×7): 25 mg via ORAL
  Filled 2016-12-22 (×7): qty 1

## 2016-12-22 MED ORDER — METFORMIN HCL 500 MG PO TABS
500.0000 mg | ORAL_TABLET | Freq: Two times a day (BID) | ORAL | Status: DC
  Administered 2016-12-22 – 2016-12-24 (×5): 500 mg via ORAL
  Filled 2016-12-22 (×6): qty 1

## 2016-12-22 MED ORDER — AMIODARONE LOAD VIA INFUSION: 150.0000 mg | Freq: Once | INTRAVENOUS | Status: DC

## 2016-12-22 MED ORDER — POTASSIUM CHLORIDE CRYS ER 20 MEQ PO TBCR
40.0000 meq | EXTENDED_RELEASE_TABLET | Freq: Once | ORAL | Status: AC
  Administered 2016-12-22: 40 meq via ORAL
  Filled 2016-12-22: qty 2

## 2016-12-22 NOTE — Op Note (Signed)
NAME:  Andrew Williamson, Andrew Williamson NO.:  [id number]  MEDICAL RECORD NO.:  [id number]  LOCATION:                                FACILITY:  [facility]  PHYSICIAN:  Lanelle Bal, MD    DATE OF BIRTH:  09/22/1946  DATE OF PROCEDURE:  12/19/2016 DATE OF DISCHARGE:                              OPERATIVE REPORT   POSTOPERATIVE DIAGNOSIS:  Coronary occlusive disease with angina.  POSTOPERATIVE DIAGNOSIS:  Coronary occlusive disease with angina.  SURGICAL PROCEDURE:  Coronary artery bypass grafting x3 with left internal mammary to the left anterior descending coronary artery, reverse saphenous vein graft to the ramus, reverse saphenous vein graft to the circumflex with right thigh and calf endo vein, greater saphenous vein harvesting.  SURGEON:  Lanelle Bal, MD.  FIRST ASSISTANT:  Nicholes Rough, [place].  BRIEF HISTORY:  The patient is a 71 year old diabetic male, who presented with increasing anginal symptoms.  He had underwent evaluation by Dr. Irish Lack including cardiac catheterization, which demonstrated mild luminal irregularities in the right coronary artery, 50% left main obstruction with dampening during cardiac catheterization, and proximal greater than 90% stenosis of a large ramus branch.  The primary supplier of the lateral wall and also a smaller circumflex flex branch.  Overall, ventricular function was preserved because of the patient's symptoms and positive stress test.  Critical anatomy including 50% left main and high- grade proximal circumflex disease, not amenable to angioplasty. Coronary artery bypass grafting was recommended to the patient, who agreed and signed informed consent.  DESCRIPTION OF PROCEDURE:  With Swan-Ganz and arterial line monitors in place, the patient underwent general endotracheal anesthesia without incidence.  Skin, the chest, and legs were prepped with Betadine and draped in usual sterile manner.  Using the [employer] endo vein  harvesting system, the right greater saphenous vein from the thigh and calf was harvested.  The vein was a dual system in the thigh, both branches were taken and the larger was used for bypass.  Median sternotomy was performed.  Left internal mammary artery was dissected down as a pedicle graft.  The distal artery was divided and had good free flow. Pericardium was opened.  Overall, ventricular function appeared preserved.  The patient was systemically heparinized.  Ascending aorta was cannulated.  The right atrium was cannulated and aortic root vent cardioplegia needle was introduced into the ascending aorta.  The patient was placed on cardiopulmonary bypass 2.4 L/min/m2.  Sites for anastomosis were selected and dissected out of the epicardium.  The patient's body temperature was cooled to 32 degrees.  Aortic crossclamp was applied and 500 mL of cold blood potassium cardioplegia was administered with diastolic arrest of the heart.  Myocardial septal temperature was monitored throughout the cross-clamp.  Attention was turned first to the circumflex branch.  This was a very small branch just over 1 mm in size.  Vessel was opened.  A 1 mm probe passed distally.  Using a running 8-0 Prolene, a segment of reverse saphenous vein graft was anastomosed to the circumflex branch.  Attention was then turned to the much larger ramus branch, which was the primary supplier of the lateral wall.  This vessel was opened and then  easily admitted a 1.5 mm probe.  Using a running 7-0 Prolene, distal anastomosis was performed.  Attention was then turned to the left anterior descending coronary artery, which was somewhat thickened vessel, but was opened between the mid and distal third of the vessel.  A 1.5 mm probe passed distally and proximally using a running 8-0 Prolene and the left internal mammary artery was anastomosed to the left anterior descending coronary artery.  With the cross-clamp still in  place, 2 punch aortotomies were performed and each of the two vein grafts were anastomosed to the ascending aorta.  The bulldog was removed from the mammary artery with prompt rise in myocardial septal temperature.  The heart was allowed to passively fill and de-air and the proximal anastomoses were completed.  Aortic crossclamp was removed with a total crossclamp time of 80 minutes.  The patient spontaneously converted to a sinus rhythm.  Sites of anastomosis were inspected and were free of bleeding.  He was then ventilated and weaned from cardiopulmonary bypass without difficulty.  Atrial and ventricular pacing wires had been applied.  Graft marker was applied left pleural tube and Blake mediastinal drain were left in place.  Pericardium was loosely reapproximated.  Sternum was closed with #6 stainless steel wire. Fascia was closed with interrupted 0 Vicryl, running 3-0 Vicryl in the subcutaneous tissue, and 4-0 subcuticular stitch in skin edges.  Dry dressings were applied.  Sponge and needle count was reported as correct after completion of procedure.  The patient tolerated the procedure without obvious complication and was transferred to the Surgical Intensive Care Unit for further postoperative care.  Total pump time was 104 minutes.  Because of low hematocrit while on bypass, he was given 2 units of packed red blood cells.     Lanelle Bal, MD     EG/MEDQ  D:  12/21/2016  T:  12/22/2016  Job:  [id number]

## 2016-12-22 NOTE — Progress Notes (Signed)
Discharge education completed with patient and his 2 daughters, they all seemed very receptive, referred to phase 11 cardiac rehab in [place]. [phone]

## 2016-12-22 NOTE — Progress Notes (Signed)
EPWs pulled protocol.  Tips intact, no ectopy or other issue noted at this time.  Will continue to monitor.

## 2016-12-22 NOTE — Progress Notes (Signed)
CARDIAC REHAB PHASE I   PRE:  Rate/Rhythm: 99 SR  BP:  Supine:   Sitting: 128/72  Standing:    SaO2: 99 % RA  MODE:  Ambulation: 550 ft   POST:  Rate/Rhythm: 103 ST  BP:  Supine:   Sitting: 148/73  Standing:    SaO2: 94% RA Tolerated ambulation extremely well with minimal assistance x 1.  Able to demonstrate IS correctly, instructed to walk a total of 3 times daily while hospitalized.  Referred to phase II cardiac rehab @ [facility].  Oxygen saturations great on RA. Will return later for discharge instructions when daughter is present.   [id number] Liliane Channel RN, BSN 12/22/2016 8:45 AM

## 2016-12-22 NOTE — Progress Notes (Addendum)
      [address]       [place] [address]             [phone]      3 Days Post-Op Procedure(s) (LRB): CORONARY ARTERY BYPASS GRAFTING (CABG) x three , using left internal mammary artery and right leg greater saphenous vein harvested endoscopically; SVG to Ramus, SVG to circ, LIMA to LAD  (N/A) TRANSESOPHAGEAL ECHOCARDIOGRAM (TEE) (N/A)   Subjective:  No complaints.  Ambulating unit without difficulty.  + BM  Objective: Vital signs in last 24 hours: Temp:  [98.2 F (36.8 C)-99.1 F (37.3 C)] 98.2 F (36.8 C) (01/13 0543) Pulse Rate:  [97-113] 97 (01/13 0543) Cardiac Rhythm: Normal sinus rhythm (01/13 0741) Resp:  [15-28] 19 (01/13 0543) BP: (124-138)/(64-76) 124/74 (01/13 0543) SpO2:  [95 %-98 %] 95 % (01/13 0543) Weight:  [208 lb 4.8 oz (94.5 kg)] 208 lb 4.8 oz (94.5 kg) (01/13 0543)  Intake/Output from previous day: 01/12 0701 - 01/13 0700 In: -  Out: 4215 [Urine:4175; Chest Tube:40]  General appearance: alert, cooperative and no distress Heart: regular rate and rhythm Lungs: clear to auscultation bilaterally Abdomen: soft, non-tender; bowel sounds normal; no masses,  no organomegaly Extremities: edema trace Wound: clean and dry  Lab Results:  Recent Labs  12/21/16 0504 12/22/16 0536  WBC 12.4* 12.1*  HGB 11.8* 12.6*  HCT 35.0* 38.3*  PLT 133* 151   BMET:  Recent Labs  12/21/16 0504 12/22/16 0536  NA 140 138  K 3.7 3.4*  CL 106 105  CO2 29 25  GLUCOSE 102* 156*  BUN 11 17  CREATININE 1.00 1.24  CALCIUM 8.2* 8.6*    PT/INR:  Recent Labs  12/19/16 1440  LABPROT 16.0*  INR 1.28   ABG    Component Value Date/Time   PHART 7.443 12/19/2016 2130   HCO3 26.2 12/19/2016 2130   TCO2 25 12/20/2016 1649   ACIDBASEDEF 6.0 (H) 12/19/2016 1949   O2SAT 99.0 12/19/2016 2130   CBG (last 3)   Recent Labs  12/21/16 1707 12/21/16 2113 12/22/16 0617  GLUCAP 166* 177* 145*    Assessment/Plan: S/P Procedure(s)  (LRB): CORONARY ARTERY BYPASS GRAFTING (CABG) x three , using left internal mammary artery and right leg greater saphenous vein harvested endoscopically; SVG to Ramus, SVG to circ, LIMA to LAD  (N/A) TRANSESOPHAGEAL ECHOCARDIOGRAM (TEE) (N/A)  1. CV- NSR, BP a little elevated- will increase Lopressor 2. Pulm- no acute issues, continue IS 3. Renal- creatinine WNL, weight is trending down, continue Lasix 4. DM- cbgs controlled, d/c insulin restart home metformin 5. Dispo- patient stable, increase Lopressor for better HR/BP control, d/c EPW, continue diuresis... Home in 24-48 hours if remains stable   LOS: 3 days    Ellwood Handler 12/22/2016   Chart reviewed, patient examined, agree with above. He feels well but went into atrial fib with RVR 160's after going to bathroom and bending over. Spontaneously converted to sinus. His Lopressor dose was increased this am to 25 tid and he is just getting the first dose so will hold off on amiodarone unless he goes back into it. K+ was low this am so will give 40 meq now.

## 2016-12-23 LAB — GLUCOSE, CAPILLARY
Glucose-Capillary: 152 mg/dL — ABNORMAL HIGH (ref 65–99)
Glucose-Capillary: 137 mg/dL — ABNORMAL HIGH (ref 65–99)
Glucose-Capillary: 130 mg/dL — ABNORMAL HIGH (ref 65–99)
Glucose-Capillary: 146 mg/dL — ABNORMAL HIGH (ref 65–99)

## 2016-12-23 LAB — TYPE AND SCREEN
ABO/RH(D): O POS
Antibody Screen: NEGATIVE
Unit division: 0
Unit division: 0
Unit division: 0
Unit division: 0

## 2016-12-23 MED ORDER — LISINOPRIL 5 MG PO TABS
5.0000 mg | ORAL_TABLET | Freq: Every day | ORAL | Status: DC
Start: 2016-12-23 — End: 2016-12-24
  Administered 2016-12-23 – 2016-12-24 (×2): 5 mg via ORAL
  Filled 2016-12-23 (×2): qty 1

## 2016-12-23 MED ORDER — POTASSIUM CHLORIDE CRYS ER 20 MEQ PO TBCR
40.0000 meq | EXTENDED_RELEASE_TABLET | Freq: Once | ORAL | Status: AC
  Administered 2016-12-23: 40 meq via ORAL
  Filled 2016-12-23: qty 2

## 2016-12-23 NOTE — Progress Notes (Addendum)
      [address]       ,[place] [address]             [phone]      4 Days Post-Op Procedure(s) (LRB): CORONARY ARTERY BYPASS GRAFTING (CABG) x three , using left internal mammary artery and right leg greater saphenous vein harvested endoscopically; SVG to Ramus, SVG to circ, LIMA to LAD  (N/A) TRANSESOPHAGEAL ECHOCARDIOGRAM (TEE) (N/A)   Subjective:  Andrew Williamson has no complaints.  He continues to feel pretty good.  States he has had a few episodes of his heart rate being up.  Objective: Vital signs in last 24 hours: Temp:  [97.5 F (36.4 C)-98.5 F (36.9 C)] 97.5 F (36.4 C) (01/14 0535) Pulse Rate:  [101-113] 101 (01/14 0535) Cardiac Rhythm: Normal sinus rhythm (01/14 0730) Resp:  [18-21] 18 (01/14 0535) BP: (118-130)/(65-78) 122/65 (01/14 0535) SpO2:  [95 %-96 %] 96 % (01/14 0535) Weight:  [206 lb (93.4 kg)] 206 lb (93.4 kg) (01/14 0535)  Intake/Output from previous day: 01/13 0701 - 01/14 0700 In: 720 [P.O.:720] Out: 1025 [Urine:1025] Intake/Output this shift: Total I/O In: 240 [P.O.:240] Out: 700 [Urine:700]  General appearance: alert, cooperative and no distress Heart: regular rate and rhythm Lungs: clear to auscultation bilaterally Abdomen: soft, non-tender; bowel sounds normal; no masses,  no organomegaly Extremities: edema trace Wound: clean and dry  Lab Results:  Recent Labs  12/21/16 0504 12/22/16 0536  WBC 12.4* 12.1*  HGB 11.8* 12.6*  HCT 35.0* 38.3*  PLT 133* 151   BMET:  Recent Labs  12/21/16 0504 12/22/16 0536  NA 140 138  K 3.7 3.4*  CL 106 105  CO2 29 25  GLUCOSE 102* 156*  BUN 11 17  CREATININE 1.00 1.24  CALCIUM 8.2* 8.6*    PT/INR: No results for input(s): LABPROT, INR in the last 72 hours. ABG    Component Value Date/Time   PHART 7.443 12/19/2016 2130   HCO3 26.2 12/19/2016 2130   TCO2 25 12/20/2016 1649   ACIDBASEDEF 6.0 (H) 12/19/2016 1949   O2SAT 99.0 12/19/2016 2130   CBG (last 3)    Recent Labs  12/22/16 1616 12/22/16 2127 12/23/16 0630  GLUCAP 158* 155* 152*    Assessment/Plan: S/P Procedure(s) (LRB): CORONARY ARTERY BYPASS GRAFTING (CABG) x three , using left internal mammary artery and right leg greater saphenous vein harvested endoscopically; SVG to Ramus, SVG to circ, LIMA to LAD  (N/A) TRANSESOPHAGEAL ECHOCARDIOGRAM (TEE) (N/A)  1. CV- Brief A. Fib yesterday, no further episodes, some mild V. Tach, currently NSR- on Lopressor at 25 mg BID, BP up to 130s at times- will restart home lisinopril at reduced dose 2. Pulm- no acute issues, continue IS 3. Renal- creatinine has been okay, K was low yesterday given supplement, will repeat supplement today, get BMET in AM... Not on lasix 4. DM- sugars well controlled 5. Dispo- patient with brief A. Fib after wire removal yesterday, converted spontaneously, continue BB, will add low dose ACE for BP, repeat BMET in AM to check K level... Monitor HR if remains in NSR will plan to d/c in AM... If has more A. Fib will start Amiodarone   LOS: 4 days    Andrew Williamson 12/23/2016   Chart reviewed, patient examined, agree with above. Plan home tomorrow if rhythm remains stable.

## 2016-12-23 NOTE — Discharge Summary (Signed)
Physician Discharge Summary  Patient ID: Andrew Williamson MRN: [id number] DOB/AGE: 03/07/1946 71 y.o.  Admit date: 12/19/2016 Discharge date: 12/24/2016  Admission Diagnoses:  Patient Active Problem List   Diagnosis Date Noted  . Chest pain due to myocardial ischemia ([facility])   . Abnormal nuclear stress test   . Essential hypertension 11/20/2016  . Pure hypercholesterolemia 11/20/2016  . Type 2 diabetes mellitus without complication ([facility]) [id number]  . Prostate cancer ([facility]) 08/11/2015   Discharge Diagnoses:   Patient Active Problem List   Diagnosis Date Noted  . S/P CABG x 3 12/19/2016  . Chest pain due to myocardial ischemia ([facility])   . Abnormal nuclear stress test   . Essential hypertension 11/20/2016  . Pure hypercholesterolemia 11/20/2016  . Type 2 diabetes mellitus without complication ([facility]) [id number]  . Prostate cancer ([facility]) 08/11/2015   Discharged Condition: good  History of Present Illness:  Mr. Andrew Williamson is a 71 yo white male referred to [facility] for evaluation of Coronary Artery disease.  He also has known history of HTN, DM, Hyperlipidemia, and prostate cancer S/P Radical prostatectomy in 2016.  He recently presented to Dr. Marlou Porch with complaints of chest pain.  This originally developed while the patient was riding a stationary bike, but resolved when he stopped exercising.  Myoview was performed and showed possible inferolateral ischemia.  Due to this cardiac catheterization was performed and showed multivessel CAD.  It was felt coronary bypass grafting would be indicated.  He was evaluated by Dr. Servando Snare at which time the patient was offered CABG procedure.  The risks and benefits of the procedure were explained to the patient and he was agreeable to proceed.  Hospital Course:   Andrew Williamson presented to [facility] on 12/19/2106.  He was taken to the operating room and underwent CABG x 3 utilizing LIMA to LAD, SVG to Ramus Intermediate, and SVG to Left Circumflex.  He  also underwent endoscopic harvest of the greater saphenous vein from the right leg.  He required blood transfusion during the procedure.  He tolerated the procedure without difficulty and was taken to the SICU in stable condition.  The patient was extubated the evening of surgery.  During his stay in the SICU the patient was weaned off NTG as BP allowed.  His chest tubes and arterial lines were removed without difficulty.  He was diuresed for hypervolemia.  He was ambulating around the SICU and was felt medically stable for transfer to the telemetry unit for further convalescence on POD #2.  The patient continues to make progress.  He developed a brief episode of Atrial Fibrillation after his pacing wires were removed.  He converted spontaneously without use of Amiodarone.  He was hypokalemic and his potassium level was supplemented.  The patient continued to do well.  He had no further episodes of Atrial Fibrillation.  He is ambulating independently. He will need good follow-up for his diabetes mellitus by his PCP.  He is felt medically stable for discharge home today. It is okay for him to take his home omeprazole. This medication was not listed in his chart as a home medication but per the patient he was taking this.         Significant Diagnostic Studies: angiography:    The left ventricular systolic function is normal.  LV end diastolic pressure is normal.  The left ventricular ejection fraction is 55-65% by visual estimate.  There is no aortic valve stenosis.  Ost LM to LM lesion, 50 %stenosed. Significant  by IVUS which showed ostial Left main to have a luminal area of 4.5 mm2. Significant catheter dampening with 6 Fr Guide.  Proximal Cx lesion, 95 %stenosed just after ramus takeoff.  Ost LAD to Prox LAD lesion, 40 %stenosed. Moderate disease in ostial ramus.  Mid Cx lesion, 95 %stenosed.  Treatments: surgery:   Coronary artery bypass grafting x3 with left internal mammary to the left  anterior descending coronary artery, reverse saphenous vein graft to the ramus, reverse saphenous vein graft to the circumflex with right thigh and calf endo vein, greater saphenous vein harvesting.  Disposition: 01-Home or Self Care   Discharge Medications:  The patient has been discharged on:   1.Beta Blocker:  Yes [ x  ]                              No   [   ]                              If No, reason:  2.Ace Inhibitor/ARB: Yes [ x  ]                                     No  [    ]                                     If No, reason:  3.Statin:   Yes [ x  ]                  No  [   ]                  If No, reason:  4.Ecasa:  Yes  [ x  ]                  No   [   ]                  If No, reason:     Discharge Instructions    Amb Referral to Cardiac Rehabilitation    Complete by:  As directed    Diagnosis:  CABG   CABG X ___:  3     Allergies as of 12/24/2016      Reactions   Thimerosal Other (See Comments)   Red eyes      Medication List    TAKE these medications   aspirin 81 MG EC tablet Take 1 tablet (81 mg total) by mouth daily. Start taking on:  12/25/2016   gabapentin 300 MG capsule Commonly known as:  NEURONTIN Take 300 mg by mouth 3 (three) times daily.   lisinopril 5 MG tablet Commonly known as:  PRINIVIL,ZESTRIL Take 1 tablet (5 mg total) by mouth daily. Start taking on:  12/25/2016   metFORMIN 500 MG tablet Commonly known as:  GLUCOPHAGE Take 1 tablet (500 mg total) by mouth 2 (two) times daily with a meal.   metoprolol tartrate 25 MG tablet Commonly known as:  LOPRESSOR Take 1 tablet (25 mg total) by mouth every 8 (eight) hours.   oxyCODONE 5 MG immediate release tablet Commonly known as:  Oxy IR/ROXICODONE Take 1 tablet (5 mg total) by mouth every  6 (six) hours as needed for severe pain.   pravastatin 20 MG tablet Commonly known as:  PRAVACHOL Take 20 mg by mouth every morning.      Follow-up Information    [name], MD  Follow up in 4 week(s).   Specialty:  Cardiothoracic Surgery Why:  Your appointment is on Monday 01/28/2017 at 1:00pm. Please arrive at 12:30pm for a chest xray at [facility] which is located on the first floor of our building.  Contact information: [address] [place] [address] [phone]        Candee Furbish, MD Follow up.   Specialty:  Cardiology Contact information: [id number] [address] [phone]        Lujean Amel, MD. Call in 1 day(s).   Specialty:  Family Medicine Contact information: [place] [address] [phone]           Signed: Elgie Collard 12/24/2016, 4:20 PM

## 2016-12-24 DIAGNOSIS — I480 Paroxysmal atrial fibrillation: Secondary | ICD-10-CM

## 2016-12-24 DIAGNOSIS — Z951 Presence of aortocoronary bypass graft: Secondary | ICD-10-CM

## 2016-12-24 LAB — BASIC METABOLIC PANEL
Anion gap: 12 (ref 5–15)
BUN: 21 mg/dL — AB (ref 6–20)
CALCIUM: 9 mg/dL (ref 8.9–10.3)
CO2: 23 mmol/L (ref 22–32)
CREATININE: 1.09 mg/dL (ref 0.61–1.24)
Chloride: 104 mmol/L (ref 101–111)
GFR calc Af Amer: >60 mL/min (ref >60)
GLUCOSE: 147 mg/dL — AB (ref 65–99)
Potassium: 3.8 mmol/L (ref 3.5–5.1)
Sodium: 139 mmol/L (ref 135–145)

## 2016-12-24 LAB — GLUCOSE, CAPILLARY
Glucose-Capillary: 149 mg/dL — ABNORMAL HIGH (ref 65–99)
Glucose-Capillary: 109 mg/dL — ABNORMAL HIGH (ref 65–99)
Glucose-Capillary: 110 mg/dL — ABNORMAL HIGH (ref 65–99)

## 2016-12-24 MED ORDER — OXYCODONE HCL 5 MG PO TABS: 5.0000 mg | ORAL_TABLET | Freq: Four times a day (QID) | ORAL | 0 refills | Status: DC | PRN

## 2016-12-24 MED ORDER — ASPIRIN 81 MG PO TBEC: 81.0000 mg | DELAYED_RELEASE_TABLET | Freq: Every day | ORAL | 1 refills | Status: DC

## 2016-12-24 MED ORDER — LISINOPRIL 5 MG PO TABS: 5.0000 mg | ORAL_TABLET | Freq: Every day | ORAL | 1 refills | Status: AC

## 2016-12-24 MED ORDER — METFORMIN HCL 500 MG PO TABS: 500.0000 mg | ORAL_TABLET | Freq: Two times a day (BID) | ORAL | 1 refills | Status: DC

## 2016-12-24 MED ORDER — METOPROLOL TARTRATE 25 MG PO TABS: 25.0000 mg | ORAL_TABLET | Freq: Three times a day (TID) | ORAL | 1 refills | Status: DC

## 2016-12-24 NOTE — Progress Notes (Addendum)
      [address]       [place] [address]             [phone]      5 Days Post-Op Procedure(s) (LRB): CORONARY ARTERY BYPASS GRAFTING (CABG) x three , using left internal mammary artery and right leg greater saphenous vein harvested endoscopically; SVG to Ramus, SVG to circ, LIMA to LAD  (N/A) TRANSESOPHAGEAL ECHOCARDIOGRAM (TEE) (N/A) Subjective: Feeling good this morning. Up shaving at the sink.   Objective: Vital signs in last 24 hours: Temp:  [97.5 F (36.4 C)-98.4 F (36.9 C)] 97.6 F (36.4 C) (01/15 0500) Pulse Rate:  [92-103] 101 (01/15 [id number]) Cardiac Rhythm: Normal sinus rhythm (01/15 0727) Resp:  [18] 18 (01/15 0500) BP: (114-122)/(62-74) 116/64 (01/15 0632) SpO2:  [96 %-98 %] 96 % (01/15 0500) Weight:  [204 lb (92.5 kg)] 204 lb (92.5 kg) (01/15 0500)     Intake/Output from previous day: 01/14 0701 - 01/15 0700 In: 720 [P.O.:720] Out: 1000 [Urine:1000] Intake/Output this shift: No intake/output data recorded.  General appearance: alert, cooperative and no distress Heart: regular rate and rhythm, S1, S2 normal, no murmur, click, rub or gallop Lungs: clear to auscultation bilaterally Abdomen: soft, non-tender; bowel sounds normal; no masses,  no organomegaly Extremities: extremities normal, atraumatic, no cyanosis or edema Wound: clean and dry  Lab Results:  Recent Labs  12/22/16 0536  WBC 12.1*  HGB 12.6*  HCT 38.3*  PLT 151   BMET:  Recent Labs  12/22/16 0536 12/24/16 0245  NA 138 139  K 3.4* 3.8  CL 105 104  CO2 25 23  GLUCOSE 156* 147*  BUN 17 21*  CREATININE 1.24 1.09  CALCIUM 8.6* 9.0    PT/INR: No results for input(s): LABPROT, INR in the last 72 hours. ABG    Component Value Date/Time   PHART 7.443 12/19/2016 2130   HCO3 26.2 12/19/2016 2130   TCO2 25 12/20/2016 1649   ACIDBASEDEF 6.0 (H) 12/19/2016 1949   O2SAT 99.0 12/19/2016 2130   CBG (last 3)   Recent Labs  12/23/16 1645 12/23/16 2120  12/24/16 0606  GLUCAP 130* 146* 149*    Assessment/Plan: S/P Procedure(s) (LRB): CORONARY ARTERY BYPASS GRAFTING (CABG) x three , using left internal mammary artery and right leg greater saphenous vein harvested endoscopically; SVG to Ramus, SVG to circ, LIMA to LAD  (N/A) TRANSESOPHAGEAL ECHOCARDIOGRAM (TEE) (N/A)  1. CV- No episode of atrial fibrillation in 24 hours. Did have some sinus tachycardia with rate of 120 with PVCs. Currently in NSR. BP much better controlled.  2. Pulm- no acute issues, continue IS 3. Renal- creatinine has been okay, K stable today.  4. DM- sugars well controlled 5. Dispo- patient with brief A. Fib after wire removal over the weekend. NSR since but has frequent PVCs. Stable in BB and ACEI. I will re-consult cardiology for thoughts on PO Amio before discharge.  Likely home today.     LOS: 5 days    Elgie Collard 12/24/2016  Holding sinus today, cardiology has seen and recommends no Amio, continue beta blocker  No anticoagulation  I have seen and examined Andrew Williamson and agree with the above assessment  and plan.  [name] MD [facility] [phone] Office [phone] 12/24/2016 5:43 PM

## 2016-12-24 NOTE — Progress Notes (Addendum)
Patient given discharge instructions, medication list, follow up appointments and paper prescriptions. All questions were answered. IV and tele dcd will discharge home with family. [place] Doig, Bettina Gavia RN

## 2016-12-24 NOTE — Care Management Important Message (Signed)
Important Message  Patient Details  Name: Andrew Williamson MRN: [id number] Date of Birth: 06-16-1946   Medicare Important Message Given:  Yes    Orbie Pyo 12/24/2016, 4:12 PM

## 2016-12-24 NOTE — Progress Notes (Signed)
Ct sutures removed as ordered and steri strips applied. Ellington Cornia, Bettina Gavia RN

## 2016-12-24 NOTE — Consult Note (Signed)
[facility] CM Primary Care Navigator  12/24/2016  COLEY KULIKOWSKI 31-May-1946 [id number]  Met with patient and daughter Jenny Reichmann) at the bedside to identify possible discharge needs. Patient reports having chest pressure/ discomfort during an exercise at the gym that led to this admission/surgery.  Patient endorses Dr. Lauretta Grill Koirala with [place] [facility] as the primary care provider.    Patient shared using [facility]) to obtain medications without any problem  Patient manages his own medications at home using "pill box" system.   He states being a able to drive prior to admission/ surgery. Daughters ([place] and [place]) or brother Margart Sickles) will provide transportation to his doctors' appointments after discharge.  Both daughters will be his primary caregivers at home as stated.  Discharge plan is home with possible cardiac rehabilitation after few weeks of recovery as reported by daughter.  Patient and daughter voiced understanding to call primary care provider's office when he returns home, for a post discharge follow-up appointment within a week or sooner if needs arise. Patient letter provided as a reminder.  Patient reports that his last 3 A1c readings were around 6.1 to 6.4. He states being able to manage DM well.  Patient denies any further needs or concerns at this time.  For additional questions please contact:  Edwena Felty A. Adriane Gabbert, BSN, RN-BC [facility] PRIMARY CARE Navigator Cell: [phone]

## 2016-12-24 NOTE — Progress Notes (Signed)
      [address]       [place] [address]             [phone]     Plans for event monitor per Cardiology before discharge noted. Will organize prescriptions however may need to hold discharge until tomorrow. Continue Lopressor 25mg  q8 with no medication changes planned at this time. No anticoagulation.

## 2016-12-24 NOTE — Discharge Instructions (Signed)
Coronary Artery Bypass Grafting, Care After °Refer to this sheet in the next few weeks. These instructions provide you with information on caring for yourself after your procedure. Your health care provider may also give you more specific instructions. Your treatment has been planned according to current medical practices, but problems sometimes occur. Call your health care provider if you have any problems or questions after your procedure. °WHAT TO EXPECT AFTER THE PROCEDURE °Recovery from surgery will be different for everyone. Some people feel well after 3 or 4 weeks, while for others it takes longer. After your procedure, it is typical to have the following: °· Nausea and a lack of appetite.   °· Constipation. °· Weakness and fatigue.   °· Depression or irritability.   °· Pain or discomfort at your incision site. °HOME CARE INSTRUCTIONS °· Take medicines only as directed by your health care provider. Do not stop taking medicines or start any new medicines without first checking with your health care provider. °· Take your pulse as directed by your health care provider. °· Perform deep breathing as directed by your health care provider. If you were given a device called an incentive spirometer, use it to practice deep breathing several times a day. Support your chest with a pillow or your arms when you take deep breaths or cough. °· Keep incision areas clean, dry, and protected. Remove or change any bandages (dressings) only as directed by your health care provider. You may have skin adhesive strips over the incision areas. Do not take the strips off. They will fall off on their own. °· Check incision areas daily for any swelling, redness, or drainage. °· If incisions were made in your legs, do the following: °¨ Avoid crossing your legs.   °¨ Avoid sitting for long periods of time. Change positions every 30 minutes.   °¨ Elevate your legs when you are sitting. °· Wear compression stockings as directed by your  health care provider. These stockings help keep blood clots from forming in your legs. °· Take showers once your health care provider approves. Until then, only take sponge baths. Pat incisions dry. Do not rub incisions with a washcloth or towel. Do not take baths, swim, or use a hot tub until your health care provider approves. °· Eat foods that are high in fiber, such as raw fruits and vegetables, whole grains, beans, and nuts. Meats should be lean cut. Avoid canned, processed, and fried foods. °· Drink enough fluid to keep your urine clear or pale yellow. °· Weigh yourself every day. This helps identify if you are retaining fluid that may make your heart and lungs work harder. °· Rest and limit activity as directed by your health care provider. You may be instructed to: °¨ Stop any activity at once if you have chest pain, shortness of breath, irregular heartbeats, or dizziness. Get help right away if you have any of these symptoms. °¨ Move around frequently for short periods or take short walks as directed by your health care provider. Increase your activities gradually. You may need physical therapy or cardiac rehabilitation to help strengthen your muscles and build your endurance. °¨ Avoid lifting, pushing, or pulling anything heavier than 10 lb (4.5 kg) for at least 6 weeks after surgery. °· Do not drive until your health care provider approves.  °· Ask your health care provider when you may return to work. °· Ask your health care provider when you may resume sexual activity. °· Keep all follow-up visits as directed by your health care   provider. This is important. °SEEK MEDICAL CARE IF: °· You have swelling, redness, increasing pain, or drainage at the site of an incision. °· You have a fever. °· You have swelling in your ankles or legs. °· You have pain in your legs.   °· You gain 2 or more pounds (0.9 kg) a day. °· You are nauseous or vomit. °· You have diarrhea.  °SEEK IMMEDIATE MEDICAL CARE IF: °· You have  chest pain that goes to your jaw or arms. °· You have shortness of breath.   °· You have a fast or irregular heartbeat.   °· You notice a "clicking" in your breastbone (sternum) when you move.   °· You have numbness or weakness in your arms or legs. °· You feel dizzy or light-headed.   °MAKE SURE YOU: °· Understand these instructions. °· Will watch your condition. °· Will get help right away if you are not doing well or get worse. °This information is not intended to replace advice given to you by your health care provider. Make sure you discuss any questions you have with your health care provider. °Document Released: 06/15/2005 Document Revised: 12/17/2014 Document Reviewed: 05/05/2013 °Elsevier Interactive Patient Education © 2017 [employer] ° ° °Endoscopic Saphenous Vein Harvesting, Care After °Introduction °Refer to this sheet in the next few weeks. These instructions provide you with information about caring for yourself after your procedure. Your health care provider may also give you more specific instructions. Your treatment has been planned according to current medical practices, but problems sometimes occur. Call your health care provider if you have any problems or questions after your procedure. °What can I expect after the procedure? °After the procedure, it is common to have: °· Pain. °· Bruising. °· Swelling. °· Numbness. °Follow these instructions at home: °Medicine °· Take over-the-counter and prescription medicines only as told by your health care provider. °· Do not drive or operate heavy machinery while taking prescription pain medicine. °Incision care °· Follow instructions from your health care provider about how to take care of the cut made during surgery (incision). Make sure you: °¨ Wash your hands with soap and water before you change your bandage (dressing). If soap and water are not available, use hand sanitizer. °¨ Change your dressing as told by your health care provider. °¨ Leave  stitches (sutures), skin glue, or adhesive strips in place. These skin closures may need to be in place for 2 weeks or longer. If adhesive strip edges start to loosen and curl up, you may trim the loose edges. Do not remove adhesive strips completely unless your health care provider tells you to do that. °· Check your incision area every day for signs of infection. Check for: °¨ More redness, swelling, or pain. °¨ More fluid or blood. °¨ Warmth. °¨ Pus or a bad smell. °General instructions °· Raise (elevate) your legs above the level of your heart while you are sitting or lying down. °· Do any exercises your health care providers have given you. These may include deep breathing, coughing, and walking exercises. °· Do not shower, take baths, swim, or use a hot tub unless told by your health care provider. °· Wear your elastic stocking if told by your health care provider. °· Keep all follow-up visits as told by your health care provider. This is important. °Contact a health care provider if: °· Medicine does not help your pain. °· Your pain gets worse. °· You have new leg bruises or your leg bruises get bigger. °· You have   a fever. °· Your leg feels numb. °· You have more redness, swelling, or pain around your incision. °· You have more fluid or blood coming from your incision. °· Your incision feels warm to the touch. °· You have pus or a bad smell coming from your incision. °Get help right away if: °· Your pain is severe. °· You develop pain, tenderness, warmth, redness, or swelling in any part of your leg. °· You have chest pain. °· You have trouble breathing. °This information is not intended to replace advice given to you by your health care provider. Make sure you discuss any questions you have with your health care provider. °Document Released: 08/08/2011 Document Revised: 05/03/2016 Document Reviewed: 10/10/2015 °© 2017 Elsevier ° ° °

## 2016-12-24 NOTE — Consult Note (Signed)
Cardiology Consult    Patient ID: FUE STIPES MRN: [id number], DOB/AGE: 71-Sep-1947   Admit date: 12/19/2016 Date of Consult: 12/24/2016  Primary Physician: Lujean Amel, MD Primary Cardiologist: Dr. Marlou Porch  Requesting Provider: Dr. Servando Snare Reason for Consultation: AF  Patient Profile    71 yo male with PMH of HTN, NIDDM, HL, CAD s/p CABG 3v (SVG to Ramus, SVG to circ, LIMA to LAD) who developed brief AF on telemetry this admission.   Past Medical History   Past Medical History:  Diagnosis Date  . Allergic rhinitis   . Coronary artery disease   . 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 DJD (degenerative joint disease)    hips  . ED (erectile dysfunction)   . GERD (gastroesophageal reflux disease)   . Gynecomastia, male   . History of echocardiogram    Echo 12/17: EF 55-60, normal wall motion, grade 1 diastolic dysfunction  . History of nuclear stress test    Myoview 12/17: EF 55, inf-lateral ischemia; intermediate risk  . HLD (hyperlipidemia)    hx of ? elevated LFTs on statin Rx  . Hypertension   . Obesity   . Prostate cancer [facility])    s/p radical prostatectomy  . Spinal stenosis     Past Surgical History:  Procedure Laterality Date  . CARDIAC CATHETERIZATION N/A 12/05/2016   Procedure: Left Heart Cath and Coronary Angiography;  Surgeon: Jettie Booze, MD;  Location: [facility];  Service: Cardiovascular;  Laterality: N/A;  . CARDIAC CATHETERIZATION N/A 12/05/2016   Procedure: Intravascular Ultrasound/IVUS;  Surgeon: Jettie Booze, MD;  Location: [facility];  Service: Cardiovascular;  Laterality: N/A;  . COLONOSCOPY  06/2014   FU 10 years  . CORONARY ARTERY BYPASS GRAFT N/A 12/19/2016   Procedure: CORONARY ARTERY BYPASS GRAFTING (CABG) x three , using left internal mammary artery and right leg greater saphenous vein harvested endoscopically; SVG to Ramus, SVG to circ, LIMA to LAD ;  Surgeon: [name], MD;  Location: [facility];  Service: Open Heart Surgery;  Laterality: N/A;  . LAPAROSCOPIC CHOLECYSTECTOMY     2012  . ROBOT ASSISTED LAPAROSCOPIC RADICAL PROSTATECTOMY N/A 08/11/2015   Procedure: ROBOTIC ASSISTED LAPAROSCOPIC RADICAL PROSTATECTOMY;  Surgeon: Irine Seal, MD;  Location: [facility];  Service: Urology;  Laterality: N/A;  . TEE WITHOUT CARDIOVERSION N/A 12/19/2016   Procedure: TRANSESOPHAGEAL ECHOCARDIOGRAM (TEE);  Surgeon: [name], MD;  Location: [facility];  Service: Open Heart Surgery;  Laterality: N/A;     Allergies  Allergies  Allergen Reactions  . Thimerosal Other (See Comments)    Red eyes     History of Present Illness    Mr. Tomasino is a 71 yo male with PMH of  HTN, NIDDM, HL, CAD s/p CABG 3v (SVG to Ramus, SVG to circ, LIMA to LAD). He is a patient of Dr. Marlou Porch who was seen in the office on 11/21/16 and sent for stress test that was abnormal and then cath. He underwent LHC with Dr. Irish Lack on 12/05/16 noting multivessel including left main. CVTS was consulted regarding possible CABG. He was seen by Dr. Servando Snare and planned for CABG on 1/9.   He underwent successful CABG, 3v SVG to Ramus, SVG to circ, LIMA to LAD. He has been dong very well post surgery. Did have a brief documented episode of AF RVR on 12/22/16 around 1340 that last only about 5 minutes then resolved. Reports he was in his room  and recently had a BM, came out to wash his hand and bent over to pick up a cloth on the floor. Also recently had his EPW pulled. Then began to walk in the hallway when the nursing staff reported his elevated HR and brought him back to the room. His lopressor was increased to 25mg  BID, K+ was also noted to be low that morning. He denies any chest pain, palpitations, dyspnea or dizziness with this episode. Has not had any recurrent AF since.   Inpatient Medications    . aspirin EC  81 mg Oral Daily  . docusate sodium  200 mg Oral Daily  . enoxaparin (LOVENOX) injection  40  mg Subcutaneous QHS  . gabapentin  300 mg Oral TID  . insulin aspart  0-24 Units Subcutaneous TID AC & HS  . lisinopril  5 mg Oral Daily  . metFORMIN  500 mg Oral BID WC  . metoprolol tartrate  25 mg Oral Q8H  . pantoprazole  40 mg Oral QAC breakfast  . pravastatin  20 mg Oral q1800  . sodium chloride flush  3 mL Intravenous Q12H    Family History    Family History  Problem Relation Age of Onset  . Heart attack Mother 42  . Heart failure Father 61    cardiac arrest during surgery >> CHF  . Peripheral Artery Disease Father   . Heart attack Maternal Uncle     Social History    Social History   Social History  . Marital status: Widowed    Spouse name: N/A  . Number of children: N/A  . Years of education: N/A   Occupational History  . Not on file.   Social History Main Topics  . Smoking status: Never Smoker  . Smokeless tobacco: Current User    Types: Chew  . Alcohol use Yes     Comment: rarely  . Drug use: No  . Sexual activity: Not on file   Other Topics Concern  . 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  Cardiovascular:  See HPI Dermatological: No rash, lesions/masses Respiratory: No cough, dyspnea Urologic: No hematuria, dysuria Abdominal:   No nausea, vomiting, diarrhea, bright red blood per rectum, melena, or hematemesis Neurologic:  No visual changes, wkns, changes in mental status. All other systems reviewed and are otherwise negative except as noted above.  Physical Exam    Blood pressure (!) 114/58, pulse (!) 102, temperature 97.8 F (36.6 C), temperature source Oral, resp. rate 19, height 5\' 9"  (1.753 m), weight 204 lb (92.5 kg), SpO2 100 %.    General: Pleasant older WM, NAD Psych: Normal affect. Neuro: Alert and oriented X 3. Moves all extremities spontaneously. HEENT: Normal  Neck: Supple without bruits or JVD. Lungs:  Resp regular and unlabored, CTA. Heart: tachy no s3, s4, or murmurs. Healing sternotomy incision. Abdomen: Soft, non-tender, non-distended, BS + x 4.  Extremities: No clubbing, cyanosis or edema. DP/PT/Radials 2+ and equal bilaterally.  Labs    Troponin (Point of Care Test) No results for input(s): TROPIPOC in the last 72 hours. No results for input(s): CKTOTAL, CKMB,  TROPONINI in the last 72 hours. Lab Results  Component Value Date   WBC 12.1 (H) 12/22/2016   HGB 12.6 (L) 12/22/2016   HCT 38.3 (L) 12/22/2016   MCV 92.7 12/22/2016   PLT 151 12/22/2016    Recent Labs Lab 12/17/16 1350  12/24/16 0245  NA 141  < > 139  K 3.8  < > 3.8  CL 106  < > 104  CO2 25  < > 23  BUN 27*  < > 21*  CREATININE 1.27*  < > 1.09  CALCIUM 9.4  < > 9.0  PROT 6.7  --   --   BILITOT 0.4  --   --   ALKPHOS 60  --   --   ALT 19  --   --   AST 20  --   --   GLUCOSE 201*  < > 147*  < > = values in this interval not displayed. No results found for: CHOL, HDL, LDLCALC, TRIG No results found for: [facility]   Radiology Studies    Dg Chest 2 View  Result Date: 12/22/2016 CLINICAL DATA:  Postop check. EXAM: CHEST  2 VIEW COMPARISON:  December 21, 2016 FINDINGS: The left apical pneumothorax seen previously has resolved. A tiny left-sided pneumothorax is identified laterally after chest tube removal. There is probably a tiny left pleural effusion. No other interval changes. IMPRESSION: 1. Tiny lateral left pneumothorax after chest tube removal. 2. The previously identified left apical pneumothorax has resolved. 3. No other significant change. These results will be called to the ordering clinician or representative by the Radiologist Assistant, and communication documented in the PACS or zVision Dashboard. Electronically Signed    By: Dorise Bullion III M.D   On: 12/22/2016 08:04   Dg Chest 2 View  Result Date: 12/17/2016 CLINICAL DATA:  Preop for CABG EXAM: CHEST  2 VIEW COMPARISON:  05/12/2010 FINDINGS: Cardiomediastinal silhouette is stable. No infiltrate or pleural effusion. No pulmonary edema. Accessory azygos fissure/ lobe again noted. Mild degenerative changes lower thoracic spine. IMPRESSION: No active cardiopulmonary disease. Electronically Signed   By: Lahoma Crocker M.D.   On: 12/17/2016 14:27   Dg Chest Port 1 View  Result Date: 12/21/2016 CLINICAL DATA:  Chest pain EXAM: PORTABLE CHEST 1 VIEW COMPARISON:  December 20, 2016 FINDINGS: Endotracheal tube and nasogastric tube have been removed. Swan-Ganz catheter is been removed. Cordis tip is in the superior vena cava. There is a small left apical pneumothorax. No tension component. There is atelectatic change in the right mid lung and left base regions, stable. No new opacity. Heart is borderline prominent with pulmonary vascularity within normal limits. No adenopathy. Patient is status post coronary artery bypass grafting. IMPRESSION: There remains a chest tube on the left. There is now a small left apical pneumothorax without tension component. There are areas of atelectatic change in the right mid lung and left base regions. Lungs elsewhere clear. Stable cardiac silhouette. These results will be called to the ordering clinician or representative by the Radiologist Assistant, and communication documented in the PACS or zVision Dashboard. Electronically Signed   By: Lowella Grip III M.D.   On: 12/21/2016 07:46   Dg Chest Port 1 View  Result Date: 12/20/2016 CLINICAL DATA:  Chest tube. EXAM: PORTABLE CHEST 1 VIEW COMPARISON:  12/19/2016. FINDINGS: Interim extubation removal of NG tube. Swan-Ganz catheter left chest tube in stable position. No pneumothorax. Prior CABG. Heart size stable. Low lung volumes with basilar atelectasis again noted without interim  change. No  prominent pleural effusion. IMPRESSION: 1. Interim extubation and removal of NG tube. Swan-Ganz catheter and left chest tube in stable position. No pneumothorax. 2.  Stable bibasilar atelectasis. 3. Prior CABG.  Heart size stable. Electronically Signed   By: Marcello Moores  Register   On: 12/20/2016 07:14   Dg Chest Port 1 View  Result Date: 12/19/2016 CLINICAL DATA:  Postop CABG. EXAM: PORTABLE CHEST 1 VIEW COMPARISON:  12/19/2016 and 12/27/2016. FINDINGS: 1519 hours. Endotracheal tube tip remains in the mid trachea. Nasogastric tube projects below the diaphragm. Right IJ Swan-Ganz catheter tip in the right interlobar pulmonary artery. Lateral left chest tube is unchanged in position. The heart size and mediastinal contours are stable. There is improved aeration of both lung bases. No pneumothorax or significant pleural effusion identified. IMPRESSION: Improving bibasilar aeration. No pneumothorax. Stable support system. Electronically Signed   By: Richardean Sale M.D.   On: 12/19/2016 15:31   Dg Chest Port 1 View  Result Date: 12/19/2016 CLINICAL DATA:  Needle count off. EXAM: PORTABLE CHEST 1 VIEW COMPARISON:  PA and lateral chest x-ray December 17, 2016 FINDINGS: The patient is undergone CABG. No retained needle within the thorax is observed. There are surgical clips in the gallbladder fossa. The lungs are hypoinflated. There is an azygos lobe anatomy on the right. There is no pneumothorax. The left-sided chest tube tip projects over the posterolateral aspect of the third rib. The heart is normal in size. The pulmonary vascularity is mildly prominent centrally. There is prominence of the azygos vein. The Swan-Ganz catheter tip projects in the distal right main pulmonary artery. The endotracheal tube tip lies approximately 3 cm above the carina. The mediastinal drain lies at approximately T5. IMPRESSION: No evidence of a retained surgical needle within the chest. Post CABG changes with hypoinflation. No pleural  effusion, pneumothorax, nor more than minimal pulmonary edema. The support tubes are in reasonable position. This report was called by me to [facility] in the operating room at 2:25 p.m. on 19 December 2016. Electronically Signed   By: David  Martinique M.D.   On: 12/19/2016 14:30    ECG & Cardiac Imaging    EKG: SR with incomplete RBBB  Echo: 11/13/16  Study Conclusions  - Left ventricle: The cavity size was normal. Wall thickness was   normal. Systolic function was normal. The estimated ejection   fraction was in the range of 55% to 60%. Wall motion was normal;   there were no regional wall motion abnormalities. Doppler   parameters are consistent with abnormal left ventricular   relaxation (grade 1 diastolic dysfunction).  Impressions:  - Normal LV systolic function; grade 1 diastolic dysfunction.  Assessment & Plan    71 yo male with PMH of HTN, NIDDM, HL, CAD s/p CABG 3v (SVG to Ramus, SVG to circ, LIMA to LAD) who developed brief AF on telemetry this admission.   1. AF RVR: Had a brief 5-6 minute episode on 12/22/16 while he was walking in the hallway that resolved on its own. He was asymptomatic with this episode. Metoprolol was increased to 25mg  BID and has not had any further episodes since. Also had his EPW pulled shortly before. Given the brief nature of the episode, would recommend to continue with BB therapy without the addition of amiodarone therapy. This patients CHA2DS2-VASc Score of 4 CHF, HTN, DM, Vascular=MI/PAD/Aortic Plaque, but would not anticoagulate at this time given such a short episode.  -- May need event monitor prior to discharge to observe  for any further episodes.   2. CAD s/p CABG: Doing well post surgery. Plan per CVTS  3. HTN: controlled  4. HL: on statin  Weston Brass Reino Bellis, NP-C Pager [phone] 12/24/2016, 1:47 PM  The patient was seen, examined and discussed with Reino Bellis, NP-C and I agree with the above.   71 yo male with PMH of HTN,  NIDDM, HL, CAD s/p CABG 3v (SVG to Ramus, SVG to circ, LIMA to LAD) who developed brief AF on telemetry this admission.  The patient has normal LVEF, normal size left atrium, the episode happened within the first 72 hours post surgery and was terminated. All of these are supporting the fact that it might have been an isolated post op a-fib with low chance of recurrence. For now we will just continue metoprolol, no anticoagulation. He has a follow up arranged with Dr Marlou Porch, he can decide based on symptoms if he needs a 30 day monitor.  Ena Dawley 12/24/2016

## 2016-12-24 NOTE — Progress Notes (Signed)
CARDIAC REHAB PHASE I   PRE:  Rate/Rhythm: 93 SR  BP:  Supine: 135/78  Sitting:   Standing:    SaO2: 98%RA  MODE:  Ambulation: 840 ft   POST:  Rate/Rhythm: 108 ST  BP:  Supine:   Sitting: 150/69  Standing:    SaO2: 100%RA 1010-1032 Pt walked 840 ft on RA with steady gait. Re enforced sternal precautions. Tolerated walk well. No questions re ed done this weekend. Daughter to walk pt later. Encouraged IS.   Graylon Good, RN BSN  12/24/2016 10:27 AM

## 2016-12-27 ENCOUNTER — Telehealth: Payer: Self-pay | Admitting: Physician Assistant

## 2016-12-27 NOTE — Telephone Encounter (Signed)
      Andrew Williamson,Andrew Williamson Andrew Williamson   S/P CABG x 3  performed on 12/19/2016.  Discharged home on 12/24/2016  Medications: Current Outpatient Prescriptions on File Prior to Visit  Medication Sig Dispense Refill  . aspirin EC 81 MG EC tablet Take 1 tablet (81 mg total) by mouth daily. 30 tablet 1  . gabapentin (NEURONTIN) 300 MG capsule Take 300 mg by mouth 3 (three) times daily.     Marland Kitchen lisinopril (PRINIVIL,ZESTRIL) 5 MG tablet Take 1 tablet (5 mg total) by mouth daily. 30 tablet 1  . metFORMIN (GLUCOPHAGE) 500 MG tablet Take 1 tablet (500 mg total) by mouth 2 (two) times daily with a meal. 30 tablet 1  . metoprolol tartrate (LOPRESSOR) 25 MG tablet Take 1 tablet (25 mg total) by mouth every 8 (eight) hours. 30 tablet 1  . oxyCODONE (OXY IR/ROXICODONE) 5 MG immediate release tablet Take 1 tablet (5 mg total) by mouth every 6 (six) hours as needed for severe pain. 30 tablet 0  . pravastatin (PRAVACHOL) 20 MG tablet Take 20 mg by mouth every morning.  0   No current facility-administered medications on file prior to visit.     Coumadin:  INR check Yes/No  Problems/Concerns: Stopped all pain medication. He is doing very well. He shares that he does have some pains in his back from a pervious surgery but he plans to address this at his PCP appointment which is in a few days. He does not have any pain from him sternum. He has been walking and walked to the mailbox yesterday without difficulty. He was very careful. He knows about his cardiology appointment and his appointment with [place] on 2/19. He had no issues filling his medications. His dose of metformin changed and he actually takes less now. His blood sugar has been running in the 120s and 130s. He plans to take more frequent blood sugars throughout the day before his PCP appointment so he can have trending information to share. He states that his heart rate has been  well controlled in the 80s. He continues to take his medication as prescribed. He had not had any issues with his incision.   Assessment:  Patient is doing very well. Contact office if concerns or problems develop. He had no other questions at this time and knows our office number if issues develop.   Follow up Appointment:   Follow-up Information    [name], MD Follow up in 4 week(s).   Specialty:  Cardiothoracic Surgery Why:  Your appointment is on Monday 01/28/2017 at 1:00pm. Please arrive at 12:30pm for a chest xray at [facility] which is located on the first floor of our building.  Contact information: [address] [place] [address] [phone]

## 2016-12-31 DIAGNOSIS — I10 Essential (primary) hypertension: Secondary | ICD-10-CM | POA: Diagnosis not present

## 2016-12-31 DIAGNOSIS — I2510 Atherosclerotic heart disease of native coronary artery without angina pectoris: Secondary | ICD-10-CM | POA: Diagnosis not present

## 2016-12-31 DIAGNOSIS — Z79899 Other long term (current) drug therapy: Secondary | ICD-10-CM | POA: Diagnosis not present

## 2016-12-31 DIAGNOSIS — E119 Type 2 diabetes mellitus without complications: Secondary | ICD-10-CM | POA: Diagnosis not present

## 2016-12-31 DIAGNOSIS — Z7984 Long term (current) use of oral hypoglycemic drugs: Secondary | ICD-10-CM | POA: Diagnosis not present

## 2017-01-07 ENCOUNTER — Encounter: Payer: Self-pay | Admitting: Physician Assistant

## 2017-01-07 ENCOUNTER — Ambulatory Visit (INDEPENDENT_AMBULATORY_CARE_PROVIDER_SITE_OTHER): Payer: Medicare Other | Admitting: Physician Assistant

## 2017-01-07 VITALS — BP 120/70 | HR 71 | Ht 69.0 in | Wt 202.8 lb

## 2017-01-07 DIAGNOSIS — I2510 Atherosclerotic heart disease of native coronary artery without angina pectoris: Secondary | ICD-10-CM | POA: Diagnosis not present

## 2017-01-07 DIAGNOSIS — E7800 Pure hypercholesterolemia, unspecified: Secondary | ICD-10-CM

## 2017-01-07 DIAGNOSIS — I25119 Atherosclerotic heart disease of native coronary artery with unspecified angina pectoris: Secondary | ICD-10-CM | POA: Insufficient documentation

## 2017-01-07 DIAGNOSIS — I10 Essential (primary) hypertension: Secondary | ICD-10-CM

## 2017-01-07 HISTORY — DX: Atherosclerotic heart disease of native coronary artery without angina pectoris: I25.10

## 2017-01-07 MED ORDER — ASPIRIN EC 325 MG PO TBEC: 325.0000 mg | DELAYED_RELEASE_TABLET | Freq: Every day | ORAL | Status: DC

## 2017-01-07 NOTE — Patient Instructions (Addendum)
Medication Instructions:  Increase your Aspirin to 325 mg Once daily for 3 months, then resume 81 mg Once daily   I would like to see you on Lipitor (Atorvastatin) 40 mg Once daily or Crestor (Rosuvastatin) 20 mg Once daily - please discuss this with Lujean Amel, MD   Labwork: None   Testing/Procedures: None   Follow-Up: Win Guajardo, [facility] ON 04/09/17 @ 10:45   Any Other Special Instructions Will Be Listed Below (If Applicable). Call us if you do not hear from cardiac rehabilitation by the time you see Dr. Servando Snare.  If you need a refill on your cardiac medications before your next appointment, please call your pharmacy.

## 2017-01-07 NOTE — Progress Notes (Signed)
Cardiology Office Note:    Date:  01/07/2017   ID:  ALVAREZ PACCIONE, DOB 03/11/1946, MRN [id number]  PCP:  Lujean Amel, MD  Cardiologist:   Dr. Candee Furbish   Electrophysiologist:  n/a Urologist: Dr. Irine Seal  Referring MD: Lujean Amel, MD   Chief Complaint  Patient presents with  . Hospitalization Follow-up    s/p CABG    History of Present Illness:    Andrew Williamson is a 71 y.o. male with a hx of HTN, DM2, HL, prostate CA s/p racial prostatectomy in 9/16.  He was evaluated for chest pain in 12/17 and a nuclear stress test was abnormal with inferolateral ischemia.  He was set up for cardiac cath.  This demonstrated multivessel CAD with a LM/LCx bifurcational lesion.  He was referred to [facility] for surgical consultation.    He was admitted 1/10-1/15 and underwent CABG with Dr. Servando Snare (L-LAD, S-RI, S-LCx).  Post op course was notable for a brief episode of atrial fibrillation after his pacing wires were removed.  He converted to normal sinus rhythm without Amiodarone.  He returns for follow up.    He is here with his brother who is visiting from [place], [place].  Mr. Leachman is doing well.  He denies significant shortness of breath.  He denies orthopnea, PND, edema.  He notes his chest soreness is improved.  He denies syncope.  He plans to go to cardiac rehabilitation.    Prior CV studies that were reviewed today include:    Carotid US 12/17/16 Bilateral: intimal wall thickening CCA. 1-39% ICA plaquing.  LHC 12/05/16 >> CABG LM ostial 50 LAD ostial 40, distal 50 RI 25 LCx ostial 95, mid 95 RCA luminal irregs EF 55-65  Echo 12/17:  EF 55-60, normal wall motion, grade 1 diastolic dysfunction  Myoview 12/17: EF 55, inf-lateral ischemia; intermediate risk  Past Medical History:  Diagnosis Date  . Allergic rhinitis   . Coronary artery disease involving native coronary artery of native heart without angina pectoris 01/07/2017   a - Myoview 12/17:  EF 55, inf-lateral  ischemia; intermediate risk // b - LHC 12/05/16: oLM 50, oLAD 40, dLAD 50, RI 25, oLCx 95, mLCx 95, RCA luminal irregs // c - Echo 12/17: EF 55-60, no RWMA, Gr 1 DD // d. 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 DJD (degenerative joint disease)    hips  . ED (erectile dysfunction)   . GERD (gastroesophageal reflux disease)   . Gynecomastia, male   . History of echocardiogram    Echo 12/17: EF 55-60, normal wall motion, grade 1 diastolic dysfunction  . History of nuclear stress test    Myoview 12/17: EF 55, inf-lateral ischemia; intermediate risk  . HLD (hyperlipidemia)    hx of ? elevated LFTs on statin Rx  . Hypertension   . Obesity   . Prostate cancer [facility])    s/p radical prostatectomy  . Spinal stenosis     Past Surgical History:  Procedure Laterality Date  . CARDIAC CATHETERIZATION N/A 12/05/2016   Procedure: Left Heart Cath and Coronary Angiography;  Surgeon: Jettie Booze, MD;  Location: [facility];  Service: Cardiovascular;  Laterality: N/A;  . CARDIAC CATHETERIZATION N/A 12/05/2016   Procedure: Intravascular Ultrasound/IVUS;  Surgeon: Jettie Booze, MD;  Location: [facility];  Service: Cardiovascular;  Laterality: N/A;  . COLONOSCOPY  06/2014   FU 10 years  .  CORONARY ARTERY BYPASS GRAFT N/A 12/19/2016   Procedure: CORONARY ARTERY BYPASS GRAFTING (CABG) x three , using left internal mammary artery and right leg greater saphenous vein harvested endoscopically; SVG to Ramus, SVG to circ, LIMA to LAD ;  Surgeon: [name], MD;  Location: [facility];  Service: Open Heart Surgery;  Laterality: N/A;  . LAPAROSCOPIC CHOLECYSTECTOMY     2012  . ROBOT ASSISTED LAPAROSCOPIC RADICAL PROSTATECTOMY N/A 08/11/2015   Procedure: ROBOTIC ASSISTED LAPAROSCOPIC RADICAL PROSTATECTOMY;  Surgeon: Irine Seal, MD;  Location: [facility];  Service: Urology;  Laterality: N/A;  . TEE WITHOUT CARDIOVERSION N/A  12/19/2016   Procedure: TRANSESOPHAGEAL ECHOCARDIOGRAM (TEE);  Surgeon: [name], MD;  Location: [facility];  Service: Open Heart Surgery;  Laterality: N/A;    Current Medications: Current Meds  Medication Sig  . gabapentin (NEURONTIN) 300 MG capsule Take 300 mg by mouth 3 (three) times daily.   Marland Kitchen lisinopril (PRINIVIL,ZESTRIL) 5 MG tablet Take 1 tablet (5 mg total) by mouth daily.  . metFORMIN (GLUCOPHAGE) 500 MG tablet Take 1 tablet (500 mg total) by mouth 2 (two) times daily with a meal.  . metoprolol tartrate (LOPRESSOR) 25 MG tablet Take 1 tablet (25 mg total) by mouth every 8 (eight) hours.  . Multiple Vitamins-Minerals (CENTRUM SILVER PO) Take 1 tablet by mouth daily.  . pravastatin (PRAVACHOL) 20 MG tablet Take 20 mg by mouth every morning.  . [DISCONTINUED] aspirin EC 81 MG EC tablet Take 1 tablet (81 mg total) by mouth daily.     Allergies:   Thimerosal   Social History   Social History  . Marital status: Widowed    Spouse name: N/A  . Number of children: N/A  . Years of education: N/A   Social History Main Topics  . Smoking status: Never Smoker  . Smokeless tobacco: Current User    Types: Chew  . Alcohol use Yes     Comment: rarely  . Drug use: No  . Sexual activity: Not Asked   Other Topics Concern  . 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   ROS:   Please see the history of present illness.    ROS All other systems reviewed and are negative.   EKGs/Labs/Other Test Reviewed:    PFTs 12/17/16 FEV1 115%; FEV1/FVC 87%; DLCO  62%  EKG:  EKG is  ordered today.  The ekg ordered today demonstrates NSR, HR 71, normal axis, NSSTTW changes, QTc 419 ms, no significant changes  Recent Labs: 12/17/2016: ALT 19 12/20/2016: Magnesium 2.3 12/22/2016: Hemoglobin 12.6; Platelets 151 12/24/2016: BUN 21; Creatinine, Ser 1.09; Potassium 3.8; Sodium 139   Recent Lipid Panel No results found for: CHOL, TRIG, HDL, CHOLHDL, VLDL, LDLCALC, LDLDIRECT   Physical Exam:    VS:  BP 120/70 (BP Location: Right Arm, Patient Position: Sitting, Cuff Size: Normal)   Pulse 71   Ht 5\' 9"  (1.753 m)   Wt 202 lb 12.8 oz (92 kg)   BMI 29.95 kg/m  Wt Readings from Last 3 Encounters:  01/07/17 202 lb 12.8 oz (92 kg)  12/24/16 204 lb (92.5 kg)  12/17/16 206 lb (93.4 kg)     Physical Exam  Constitutional: He is oriented to person, place, and time. He appears well-developed and well-nourished. No distress.  HENT:  Head: Normocephalic and atraumatic.  Eyes: No scleral icterus.  Neck: No JVD present.  Cardiovascular: Normal rate, regular rhythm and normal heart sounds.   No murmur heard. Pulmonary/Chest: Effort normal. He has no wheezes. He has no rales.  Median sternotomy well healed without erythema or discharge  Abdominal: Soft. There is no tenderness.  Musculoskeletal: He exhibits no edema.  Neurological: He is alert and oriented to person, place, and time.  Skin: Skin is warm and dry.  Psychiatric: He has a normal mood and affect.    ASSESSMENT:    1. Coronary artery disease involving native coronary artery of native heart without angina pectoris   2. Essential hypertension   3. Pure hypercholesterolemia    PLAN:    In order of problems listed above:  1. CAD - s/p CABG.  He is progressing well.  He plans to attend cardiac rehabilitation.  He sees Dr. Servando Snare in a couple weeks.  Increase ASA 325 mg QD for 90 days post CABG.  Continue statin, beta-blocker.  2. HTN - BP is well controlled.   3. HL -  Continue statin. Goal  LDL < 70.  I would prefer he switch to Lipitor 40 or Crestor 20.  He plans to make adjustments with his PCP.   Medication Adjustments/Labs and Tests Ordered: Current medicines are reviewed at length with the patient today.  Concerns regarding medicines are outlined above.  Medication changes, Labs and Tests ordered today are outlined in the Patient Instructions noted below. Patient Instructions  Medication Instructions:  Increase your Aspirin to 325 mg Once daily for 3 months, then resume 81 mg Once daily   I would like to see you on Lipitor (Atorvastatin) 40 mg Once daily or Crestor (Rosuvastatin) 20 mg Once daily - please discuss this with Lujean Amel, MD   Labwork: None   Testing/Procedures: None   Follow-Up: Harshan Kearley, [facility] ON 04/09/17 @ 10:45   Any Other Special Instructions Will Be Listed Below (If Applicable). Call us if you do not hear from cardiac rehabilitation by the time you see Dr. Servando Snare.  If you need a refill on your cardiac medications before your next appointment, please call your pharmacy.   Signed, [name], PA-C  01/07/2017 2:33 PM    [facility] [address], [place], [place]  [address] Phone: [phone]; Fax: [phone]

## 2017-01-14 ENCOUNTER — Telehealth: Payer: Self-pay | Admitting: Physician Assistant

## 2017-01-14 MED ORDER — METOPROLOL TARTRATE 25 MG PO TABS: 25.0000 mg | ORAL_TABLET | Freq: Three times a day (TID) | ORAL | 3 refills | Status: DC

## 2017-01-14 NOTE — Telephone Encounter (Signed)
New message   Pt verbalized that he was suppose to start motoporol since last ov   and have a 3 month supply but he has not received anything from pharmacy

## 2017-01-14 NOTE — Telephone Encounter (Signed)
Pt notified Rx for Metoprolol has been sent in to [facility].

## 2017-01-24 DIAGNOSIS — Z8546 Personal history of malignant neoplasm of prostate: Secondary | ICD-10-CM | POA: Diagnosis not present

## 2017-01-25 ENCOUNTER — Other Ambulatory Visit: Payer: Self-pay | Admitting: Cardiothoracic Surgery

## 2017-01-25 DIAGNOSIS — Z951 Presence of aortocoronary bypass graft: Secondary | ICD-10-CM

## 2017-01-28 ENCOUNTER — Ambulatory Visit (INDEPENDENT_AMBULATORY_CARE_PROVIDER_SITE_OTHER): Payer: Self-pay | Admitting: Physician Assistant

## 2017-01-28 ENCOUNTER — Ambulatory Visit
Admission: RE | Admit: 2017-01-28 | Discharge: 2017-01-28 | Disposition: A | Discharge status: Home / Self Care | Payer: Medicare Other | Source: Ambulatory Visit | Attending: Cardiothoracic Surgery | Admitting: Cardiothoracic Surgery

## 2017-01-28 VITALS — BP 120/72 | Resp 16 | Ht 69.0 in | Wt 202.0 lb

## 2017-01-28 DIAGNOSIS — Z951 Presence of aortocoronary bypass graft: Secondary | ICD-10-CM | POA: Diagnosis not present

## 2017-01-28 DIAGNOSIS — I2510 Atherosclerotic heart disease of native coronary artery without angina pectoris: Secondary | ICD-10-CM

## 2017-01-28 NOTE — Patient Instructions (Signed)
You are encouraged to enroll and participate in the outpatient cardiac rehab program beginning as soon as practical.   You may return to driving an automobile as long as you are no longer requiring oral narcotic pain relievers during the daytime.  It would be wise to start driving only short distances during the daylight and gradually increase from there as you feel comfortable.   Make every effort to maintain a "heart-healthy" lifestyle with regular physical exercise and adherence to a low-fat, low-carbohydrate diet.  Continue to seek regular follow-up appointments with your primary care physician and/or cardiologist.   You may gradually increase your physical activity as tolerated without any particular limitations at this time, except weigh limited to 20 lbs

## 2017-01-28 NOTE — Progress Notes (Signed)
HPI:  Patient returns for routine postoperative follow-up having undergone CABG x 3  on 12/19/2016. The patient's early postoperative recovery while in the hospital was notable for Atrial Fibrillation responded well to  Lopressor.  Since hospital discharge the patient reports he is doing very well.  He has been ambulating independently as the weather allows.  He does have some residual discomfort in the middle of his sternum.  He states there is also a small amount of drainage present in that area that comes and goes.  He questions if he can resume driving, square dancing, and bowling.  He also asks the need for antibiotics at the dentist as he was scheduled for a routine cleaning and the office was concerned about him having recent heart surgery.  Current Outpatient Prescriptions  Medication Sig Dispense Refill  . aspirin EC 325 MG tablet Take 1 tablet (325 mg total) by mouth daily.    Marland Kitchen gabapentin (NEURONTIN) 300 MG capsule Take 300 mg by mouth 3 (three) times daily.     Marland Kitchen lisinopril (PRINIVIL,ZESTRIL) 5 MG tablet Take 1 tablet (5 mg total) by mouth daily. 30 tablet 1  . metFORMIN (GLUCOPHAGE) 500 MG tablet Take 1 tablet (500 mg total) by mouth 2 (two) times daily with a meal. 30 tablet 1  . metoprolol tartrate (LOPRESSOR) 25 MG tablet Take 1 tablet (25 mg total) by mouth every 8 (eight) hours. 270 tablet 3  . Multiple Vitamins-Minerals (CENTRUM SILVER PO) Take 1 tablet by mouth daily.    . pravastatin (PRAVACHOL) 20 MG tablet Take 20 mg by mouth every morning.  0   No current facility-administered medications for this visit.     Physical Exam:  BP 120/72 (BP Location: Right Arm, Patient Position: Sitting, Cuff Size: Large)   Resp 16   Ht 5\' 9"  (1.753 m)   Wt 202 lb (91.6 kg)   SpO2 98% Comment: ON RA  BMI 29.83 kg/m   Gen: no apparent distress Heart: RRR Lungs: CTA bilaterally Abd: soft non-tender, non-distended Ext: no edema Incisions: clean and dry.... Minimal area of suture  trying to be expressed from middle of sternotomy.... No drainage or erythema present  Diagnostic Tests:  CXR: minimal left pleural effusion, no pneumothorax,  Azygous fissure  A/P:  1. S/P CABG x 3- doing very well, maintaining NSR.. BP and HR controlled with Lopressor and Lisinopril 2. Sternal tenderness- small area of stitch trying to be expressed... No evidence of infection, continue diligent wound care 3. Cardiac rehab- okay to start from our standpoint 4. Activity- as tolerated, may resume square dancing... However may not resume bowling until he is 12 weeks out from surgery.Marland Kitchen He understands and is agreeable to this... May resume driving 5. Dispo- patient doing well, RTC prn.... Dr. Servando Snare was present and evaluated patient as well and is in agreement to return prn  Ellwood Handler, PA-C [facility] [phone]

## 2017-01-29 DIAGNOSIS — I10 Essential (primary) hypertension: Secondary | ICD-10-CM | POA: Diagnosis not present

## 2017-01-29 DIAGNOSIS — Z79899 Other long term (current) drug therapy: Secondary | ICD-10-CM | POA: Diagnosis not present

## 2017-01-29 DIAGNOSIS — E119 Type 2 diabetes mellitus without complications: Secondary | ICD-10-CM | POA: Diagnosis not present

## 2017-01-29 DIAGNOSIS — K219 Gastro-esophageal reflux disease without esophagitis: Secondary | ICD-10-CM | POA: Diagnosis not present

## 2017-01-29 DIAGNOSIS — I2510 Atherosclerotic heart disease of native coronary artery without angina pectoris: Secondary | ICD-10-CM | POA: Diagnosis not present

## 2017-01-29 DIAGNOSIS — Z0001 Encounter for general adult medical examination with abnormal findings: Secondary | ICD-10-CM | POA: Diagnosis not present

## 2017-01-29 DIAGNOSIS — E7800 Pure hypercholesterolemia, unspecified: Secondary | ICD-10-CM | POA: Diagnosis not present

## 2017-01-30 ENCOUNTER — Telehealth (HOSPITAL_COMMUNITY): Payer: Self-pay | Admitting: Family Medicine

## 2017-01-30 NOTE — Telephone Encounter (Signed)
S/w [name] through [employer] benefits stating they follow Medicare guidelines with no limits, Reference # [name]   Passport with [employer] verifying insurance benefits states follow Medicare guidelines with no limits.  Reference # [id number].... KJ

## 2017-01-31 DIAGNOSIS — Z8546 Personal history of malignant neoplasm of prostate: Secondary | ICD-10-CM | POA: Diagnosis not present

## 2017-01-31 DIAGNOSIS — E291 Testicular hypofunction: Secondary | ICD-10-CM | POA: Diagnosis not present

## 2017-01-31 DIAGNOSIS — N393 Stress incontinence (female) (male): Secondary | ICD-10-CM | POA: Diagnosis not present

## 2017-01-31 DIAGNOSIS — N5231 Erectile dysfunction following radical prostatectomy: Secondary | ICD-10-CM | POA: Diagnosis not present

## 2017-02-05 ENCOUNTER — Encounter (HOSPITAL_COMMUNITY)
Admission: RE | Admit: 2017-02-05 | Discharge: 2017-02-05 | Disposition: A | Discharge status: Home / Self Care | Payer: Medicare Other | Source: Ambulatory Visit | Attending: Cardiology | Admitting: Cardiology

## 2017-02-05 VITALS — BP 110/57 | HR 71 | Ht 69.0 in | Wt 207.0 lb

## 2017-02-05 DIAGNOSIS — Z8546 Personal history of malignant neoplasm of prostate: Secondary | ICD-10-CM | POA: Diagnosis not present

## 2017-02-05 DIAGNOSIS — K219 Gastro-esophageal reflux disease without esophagitis: Secondary | ICD-10-CM | POA: Diagnosis not present

## 2017-02-05 DIAGNOSIS — N529 Male erectile dysfunction, unspecified: Secondary | ICD-10-CM | POA: Insufficient documentation

## 2017-02-05 DIAGNOSIS — Z951 Presence of aortocoronary bypass graft: Secondary | ICD-10-CM | POA: Insufficient documentation

## 2017-02-05 DIAGNOSIS — E785 Hyperlipidemia, unspecified: Secondary | ICD-10-CM | POA: Diagnosis not present

## 2017-02-05 DIAGNOSIS — I10 Essential (primary) hypertension: Secondary | ICD-10-CM | POA: Diagnosis not present

## 2017-02-05 DIAGNOSIS — M160 Bilateral primary osteoarthritis of hip: Secondary | ICD-10-CM | POA: Insufficient documentation

## 2017-02-05 DIAGNOSIS — E669 Obesity, unspecified: Secondary | ICD-10-CM | POA: Insufficient documentation

## 2017-02-05 DIAGNOSIS — E119 Type 2 diabetes mellitus without complications: Secondary | ICD-10-CM | POA: Diagnosis not present

## 2017-02-05 DIAGNOSIS — N62 Hypertrophy of breast: Secondary | ICD-10-CM | POA: Insufficient documentation

## 2017-02-05 DIAGNOSIS — Z7982 Long term (current) use of aspirin: Secondary | ICD-10-CM | POA: Insufficient documentation

## 2017-02-05 DIAGNOSIS — I2510 Atherosclerotic heart disease of native coronary artery without angina pectoris: Secondary | ICD-10-CM | POA: Diagnosis not present

## 2017-02-05 NOTE — Progress Notes (Signed)
Cardiac Individual Treatment Plan  Patient Details  Name: Andrew Williamson MRN: [id number] Date of Birth: 11-Jun-1946 Referring Provider:   Flowsheet Row CARDIAC REHAB PHASE II ORIENTATION from 02/05/2017 in [place]  Referring Provider  Candee Furbish MD      Initial Encounter Date:  [place] from 02/05/2017 in [place]  Date  02/05/17  Referring Provider  Candee Furbish MD      Visit Diagnosis: 12/19/16 S/P CABG x 3  Patient's Home Medications on Admission:  Current Outpatient Prescriptions:  .  aspirin EC 325 MG tablet, Take 1 tablet (325 mg total) by mouth daily., Disp: , Rfl:  .  gabapentin (NEURONTIN) 300 MG capsule, Take 300 mg by mouth 3 (three) times daily. , Disp: , Rfl:  .  lisinopril (PRINIVIL,ZESTRIL) 5 MG tablet, Take 1 tablet (5 mg total) by mouth daily., Disp: 30 tablet, Rfl: 1 .  metFORMIN (GLUCOPHAGE) 500 MG tablet, Take 1 tablet (500 mg total) by mouth 2 (two) times daily with a meal. (Patient taking differently: Take 1,000 mg by mouth 2 (two) times daily with a meal. ), Disp: 30 tablet, Rfl: 1 .  metoprolol tartrate (LOPRESSOR) 25 MG tablet, Take 1 tablet (25 mg total) by mouth every 8 (eight) hours., Disp: 270 tablet, Rfl: 3 .  Multiple Vitamins-Minerals (CENTRUM SILVER PO), Take 1 tablet by mouth daily., Disp: , Rfl:  .  rosuvastatin (CRESTOR) 20 MG tablet, Take 20 mg by mouth daily., Disp: , Rfl:   Past Medical History: Past Medical History:  Diagnosis Date  . Allergic rhinitis   . Coronary artery disease involving native coronary artery of native heart without angina pectoris 01/07/2017   a - Myoview 12/17:  EF 55, inf-lateral ischemia; intermediate risk // b - LHC 12/05/16: oLM 50, oLAD 40, dLAD 50, RI 25, oLCx 95, mLCx 95, RCA luminal irregs // c - Echo 12/17: EF 55-60, no RWMA, Gr 1 DD // d. 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 DJD (degenerative joint disease)    hips  . ED (erectile dysfunction)   . GERD (gastroesophageal reflux disease)   . Gynecomastia, male   . History of echocardiogram    Echo 12/17: EF 55-60, normal wall motion, grade 1 diastolic dysfunction  . History of nuclear stress test    Myoview 12/17: EF 55, inf-lateral ischemia; intermediate risk  . HLD (hyperlipidemia)    hx of ? elevated LFTs on statin Rx  . Hypertension   . Obesity   . Prostate cancer [facility])    s/p radical prostatectomy  . Spinal stenosis     Tobacco Use: History  Smoking Status  . Never Smoker  Smokeless Tobacco  . Current User  . Types: Chew    Labs: Recent Review Flowsheet Data    Labs for ITP Cardiac and Pulmonary Rehab Latest Ref Rng & Units 12/19/2016 12/19/2016 12/19/2016 12/19/2016 12/20/2016   Hemoglobin A1c 4.8 - 5.6 % - - - - -   PHART 7.350 - 7.450 7.342(L) 7.312(L) - 7.443 -   PCO2ART 32.0 - 48.0 mmHg 40.8 38.4 - 38.6 -   HCO3 20.0 - 28.0 mmol/L 22.6 19.4(L) - 26.2 -   TCO2 0 - 100 mmol/L 24 20 21 27 25    ACIDBASEDEF 0.0 - 2.0 mmol/L 3.0(H) 6.0(H) - - -   O2SAT % 96.0 99.0 -  99.0 -      Capillary Blood Glucose: Lab Results  Component Value Date   GLUCAP 110 (H) 12/24/2016   GLUCAP 109 (H) 12/24/2016   GLUCAP 149 (H) 12/24/2016   GLUCAP 146 (H) 12/23/2016   GLUCAP 130 (H) 12/23/2016     Exercise Target Goals: Date: 02/05/17  Exercise Program Goal: Individual exercise prescription set with THRR, safety & activity barriers. Participant demonstrates ability to understand and report RPE using BORG scale, to self-measure pulse accurately, and to acknowledge the importance of the exercise prescription.  Exercise Prescription Goal: Starting with aerobic activity 30 plus minutes a day, 3 days per week for initial exercise prescription. Provide home exercise prescription and guidelines that participant acknowledges understanding prior to  discharge.  Activity Barriers & Risk Stratification:     Activity Barriers & Cardiac Risk Stratification - 02/05/17 1120      Activity Barriers & Cardiac Risk Stratification   Activity Barriers Back Problems   Cardiac Risk Stratification High      6 Minute Walk:     6 Minute Walk    Row Name 02/05/17 1031         6 Minute Walk   Phase Initial     Distance 1400 feet     Walk Time 6 minutes     # of Rest Breaks 0     MPH 2.65     METS 3.07     RPE 10     VO2 Peak 10.74     Symptoms Yes (comment)     Comments Patient c/o dyspnea.     Resting HR 71 bpm     Resting BP 110/57     Max Ex. HR 102 bpm     Max Ex. BP 148/78     2 Minute Post BP 104/60        Oxygen Initial Assessment:   Oxygen Re-Evaluation:   Oxygen Discharge (Final Oxygen Re-Evaluation):   Initial Exercise Prescription:     Initial Exercise Prescription - 02/05/17 1100      Date of Initial Exercise RX and Referring Provider   Date 02/05/17   Referring Provider Candee Furbish MD     Treadmill   MPH 2.3   Grade 1   Minutes 10   METs 3.08     Bike   Level 0.5   Minutes 10   METs 1.99     NuStep   Level 3   SPM 80   Minutes 10   METs 2     Prescription Details   Frequency (times per week) 3   Duration Progress to 30 minutes of continuous aerobic without signs/symptoms of physical distress     Intensity   THRR 40-80% of Max Heartrate 60-120   Ratings of Perceived Exertion 11-13   Perceived Dyspnea 0-4     Progression   Progression Continue to progress workloads to maintain intensity without signs/symptoms of physical distress.     Resistance Training   Training Prescription Yes   Weight 2lbs   Reps 10-15      Perform Capillary Blood Glucose checks as needed.  Exercise Prescription Changes:   Exercise Comments:   Exercise Goals and Review:     Exercise Goals    Row Name 02/05/17 0818             Exercise Goals   Increase Physical Activity Yes        Intervention Provide advice, education, support and counseling about physical activity/exercise needs.;Develop  an individualized exercise prescription for aerobic and resistive training based on initial evaluation findings, risk stratification, comorbidities and participant's personal goals.       Expected Outcomes Achievement of increased cardiorespiratory fitness and enhanced flexibility, muscular endurance and strength shown through measurements of functional capacity and personal statement of participant.       Increase Strength and Stamina Yes       Intervention Provide advice, education, support and counseling about physical activity/exercise needs.;Develop an individualized exercise prescription for aerobic and resistive training based on initial evaluation findings, risk stratification, comorbidities and participant's personal goals.       Expected Outcomes Achievement of increased cardiorespiratory fitness and enhanced flexibility, muscular endurance and strength shown through measurements of functional capacity and personal statement of participant.          Exercise Goals Re-Evaluation :    Discharge Exercise Prescription (Final Exercise Prescription Changes):   Nutrition:  Target Goals: Understanding of nutrition guidelines, daily intake of sodium 1500mg , cholesterol 200mg , calories 30% from fat and 7% or less from saturated fats, daily to have 5 or more servings of fruits and vegetables.  Biometrics:     Pre Biometrics - 02/05/17 1034      Pre Biometrics   Height 5\' 9"  (1.753 m)   Weight 207 lb 0.2 oz (93.9 kg)   Waist Circumference 41.5 inches   Hip Circumference 44.5 inches   Waist to Hip Ratio 0.93 %   BMI (Calculated) 30.6   Triceps Skinfold 35 mm   % Body Fat 32.8 %   Grip Strength 45 kg   Flexibility 8 in   Single Leg Stand 9.4 seconds       Nutrition Therapy Plan and Nutrition Goals:   Nutrition Discharge: Nutrition Scores:   Nutrition Goals  Re-Evaluation:   Nutrition Goals Re-Evaluation:   Nutrition Goals Discharge (Final Nutrition Goals Re-Evaluation):   Psychosocial: Target Goals: Acknowledge presence or absence of significant depression and/or stress, maximize coping skills, provide positive support system. Participant is able to verbalize types and ability to use techniques and skills needed for reducing stress and depression.  Initial Review & Psychosocial Screening:     Initial Psych Review & Screening - 02/05/17 1220      Initial Review   Current issues with None Identified     Family Dynamics   Good Support System? Yes  daugheters, brothers and friends    Comments Upon brief assessment, no psychosocial needs identified, no interventions necessary      Barriers   Psychosocial barriers to participate in program There are no identifiable barriers or psychosocial needs.     Screening Interventions   Interventions Encouraged to exercise;Provide feedback about the scores to participant      Quality of Life Scores:     Quality of Life - 02/05/17 1006      Quality of Life Scores   Health/Function Pre 26.93 %   Socioeconomic Pre 28.07 %   Psych/Spiritual Pre 28.18 %   Family Pre 28.5 %   GLOBAL Pre 27.65 %      PHQ-9: Recent Review Flowsheet Data    There is no flowsheet data to display.     Interpretation of Total Score  Total Score Depression Severity:  1-4 = Minimal depression, 5-9 = Mild depression, 10-14 = Moderate depression, 15-19 = Moderately severe depression, 20-27 = Severe depression   Psychosocial Evaluation and Intervention:   Psychosocial Re-Evaluation:   Psychosocial Discharge (Final Psychosocial Re-Evaluation):   Vocational Rehabilitation: Provide  vocational rehab assistance to qualifying candidates.   Vocational Rehab Evaluation & Intervention:     Vocational Rehab - 02/05/17 1219      Initial Vocational Rehab Evaluation & Intervention   Assessment shows need for  Vocational Rehabilitation No      Education: Education Goals: Education classes will be provided on a weekly basis, covering required topics. Participant will state understanding/return demonstration of topics presented.  Learning Barriers/Preferences:     Learning Barriers/Preferences - 02/05/17 0818      Learning Barriers/Preferences   Learning Barriers Sight   Learning Preferences Written Material;Skilled Demonstration;Pictoral;Verbal Instruction;Video      Education Topics: Count Your Pulse:  -Group instruction provided by verbal instruction, demonstration, patient participation and written materials to support subject.  Instructors address importance of being able to find your pulse and how to count your pulse when at home without a heart monitor.  Patients get hands on experience counting their pulse with staff help and individually.   Heart Attack, Angina, and Risk Factor Modification:  -Group instruction provided by verbal instruction, video, and written materials to support subject.  Instructors address signs and symptoms of angina and heart attacks.    Also discuss risk factors for heart disease and how to make changes to improve heart health risk factors.   Functional Fitness:  -Group instruction provided by verbal instruction, demonstration, patient participation, and written materials to support subject.  Instructors address safety measures for doing things around the house.  Discuss how to get up and down off the floor, how to pick things up properly, how to safely get out of a chair without assistance, and balance training.   Meditation and Mindfulness:  -Group instruction provided by verbal instruction, patient participation, and written materials to support subject.  Instructor addresses importance of mindfulness and meditation practice to help reduce stress and improve awareness.  Instructor also leads participants through a meditation exercise.    Stretching for  Flexibility and Mobility:  -Group instruction provided by verbal instruction, patient participation, and written materials to support subject.  Instructors lead participants through series of stretches that are designed to increase flexibility thus improving mobility.  These stretches are additional exercise for major muscle groups that are typically performed during regular warm up and cool down.   Hands Only CPR Anytime:  -Group instruction provided by verbal instruction, video, patient participation and written materials to support subject.  Instructors co-teach with AHA video for hands only CPR.  Participants get hands on experience with mannequins.   Nutrition I class: Heart Healthy Eating:  -Group instruction provided by PowerPoint slides, verbal discussion, and written materials to support subject matter. The instructor gives an explanation and review of the Therapeutic Lifestyle Changes diet recommendations, which includes a discussion on lipid goals, dietary fat, sodium, fiber, plant stanol/sterol esters, sugar, and the components of a well-balanced, healthy diet.   Nutrition II class: Lifestyle Skills:  -Group instruction provided by PowerPoint slides, verbal discussion, and written materials to support subject matter. The instructor gives an explanation and review of label reading, grocery shopping for heart health, heart healthy recipe modifications, and ways to make healthier choices when eating out.   Diabetes Question & Answer:  -Group instruction provided by PowerPoint slides, verbal discussion, and written materials to support subject matter. The instructor gives an explanation and review of diabetes co-morbidities, pre- and post-prandial blood glucose goals, pre-exercise blood glucose goals, signs, symptoms, and treatment of hypoglycemia and hyperglycemia, and foot care basics.   Diabetes Blitz:  -  Group instruction provided by PowerPoint slides, verbal discussion, and written  materials to support subject matter. The instructor gives an explanation and review of the physiology behind type 1 and type 2 diabetes, diabetes medications and rational behind using different medications, pre- and post-prandial blood glucose recommendations and Hemoglobin A1c goals, diabetes diet, and exercise including blood glucose guidelines for exercising safely.    Portion Distortion:  -Group instruction provided by PowerPoint slides, verbal discussion, written materials, and food models to support subject matter. The instructor gives an explanation of serving size versus portion size, changes in portions sizes over the last 20 years, and what consists of a serving from each food group.   Stress Management:  -Group instruction provided by verbal instruction, video, and written materials to support subject matter.  Instructors review role of stress in heart disease and how to cope with stress positively.     Exercising on Your Own:  -Group instruction provided by verbal instruction, power point, and written materials to support subject.  Instructors discuss benefits of exercise, components of exercise, frequency and intensity of exercise, and end points for exercise.  Also discuss use of nitroglycerin and activating EMS.  Review options of places to exercise outside of rehab.  Review guidelines for sex with heart disease.   Cardiac Drugs I:  -Group instruction provided by verbal instruction and written materials to support subject.  Instructor reviews cardiac drug classes: antiplatelets, anticoagulants, beta blockers, and statins.  Instructor discusses reasons, side effects, and lifestyle considerations for each drug class.   Cardiac Drugs II:  -Group instruction provided by verbal instruction and written materials to support subject.  Instructor reviews cardiac drug classes: angiotensin converting enzyme inhibitors (ACE-I), angiotensin II receptor blockers (ARBs), nitrates, and calcium  channel blockers.  Instructor discusses reasons, side effects, and lifestyle considerations for each drug class.   Anatomy and Physiology of the Circulatory System:  -Group instruction provided by verbal instruction, video, and written materials to support subject.  Reviews functional anatomy of heart, how it relates to various diagnoses, and what role the heart plays in the overall system.   Knowledge Questionnaire Score:     Knowledge Questionnaire Score - 02/05/17 1006      Knowledge Questionnaire Score   Pre Score 22/24      Core Components/Risk Factors/Patient Goals at Admission:     Personal Goals and Risk Factors at Admission - 02/05/17 1124      Core Components/Risk Factors/Patient Goals on Admission    Weight Management Yes;Obesity;Weight Loss;Weight Maintenance   Intervention Weight Management: Develop a combined nutrition and exercise program designed to reach desired caloric intake, while maintaining appropriate intake of nutrient and fiber, sodium and fats, and appropriate energy expenditure required for the weight goal.;Weight Management: Provide education and appropriate resources to help participant work on and attain dietary goals.;Weight Management/Obesity: Establish reasonable short term and long term weight goals.;Obesity: Provide education and appropriate resources to help participant work on and attain dietary goals.   Expected Outcomes Short Term: Continue to assess and modify interventions until short term weight is achieved;Long Term: Adherence to nutrition and physical activity/exercise program aimed toward attainment of established weight goal;Weight Maintenance: Understanding of the daily nutrition guidelines, which includes 25-35% calories from fat, 7% or less cal from saturated fats, less than 200mg  cholesterol, less than 1.5gm of sodium, & 5 or more servings of fruits and vegetables daily;Weight Loss: Understanding of general recommendations for a balanced  deficit meal plan, which promotes 1-2 lb weight loss per  week and includes a negative energy balance of [id number] kcal/d;Understanding recommendations for meals to include 15-35% energy as protein, 25-35% energy from fat, 35-60% energy from carbohydrates, less than 200mg  of dietary cholesterol, 20-35 gm of total fiber daily;Understanding of distribution of calorie intake throughout the day with the consumption of 4-5 meals/snacks   Diabetes Yes   Intervention Provide education about signs/symptoms and action to take for hypo/hyperglycemia.;Provide education about proper nutrition, including hydration, and aerobic/resistive exercise prescription along with prescribed medications to achieve blood glucose in normal ranges: Fasting glucose 65-99 mg/dL   Expected Outcomes Long Term: Attainment of HbA1C < 7%.;Short Term: Participant verbalizes understanding of the signs/symptoms and immediate care of hyper/hypoglycemia, proper foot care and importance of medication, aerobic/resistive exercise and nutrition plan for blood glucose control.   Hypertension Yes   Intervention Provide education on lifestyle modifcations including regular physical activity/exercise, weight management, moderate sodium restriction and increased consumption of fresh fruit, vegetables, and low fat dairy, alcohol moderation, and smoking cessation.;Monitor prescription use compliance.   Expected Outcomes Short Term: Continued assessment and intervention until BP is < 140/67mm HG in hypertensive participants. < 130/44mm HG in hypertensive participants with diabetes, heart failure or chronic kidney disease.;Long Term: Maintenance of blood pressure at goal levels.   Lipids Yes   Intervention Provide education and support for participant on nutrition & aerobic/resistive exercise along with prescribed medications to achieve LDL 70mg , HDL >40mg .   Expected Outcomes Short Term: Participant states understanding of desired cholesterol values and is  compliant with medications prescribed. Participant is following exercise prescription and nutrition guidelines.;Long Term: Cholesterol controlled with medications as prescribed, with individualized exercise RX and with personalized nutrition plan. Value goals: LDL < 70mg , HDL > 40 mg.   Personal Goal Other Yes   Personal Goal Be able to get back to playing golf without being limited by back pain, Long term: lose weight, goal at 185-190lbs   Intervention Provide exercise programming and education on nutrition couseling to assist with weightloss and improving cardiorespiratory fitness.   Expected Outcomes Pt will be able to return to hobbies of bowling, playing golf and square dancing without being limited by back pain and endurance.      Core Components/Risk Factors/Patient Goals Review:    Core Components/Risk Factors/Patient Goals at Discharge (Final Review):    ITP Comments:     ITP Comments    Row Name 02/05/17 0815           ITP Comments Dr. Fransico Him, Medical Director          Comments: Patient attended orientation from [phone] to [phone] to review rules and guidelines for program. Completed 6 minute walk test, Intitial ITP, and exercise prescription.  VSS. Telemetry-sinus rhythm, occasional PVC, pt c/o dyspnea on exertion, otherwise  Asymptomatic.

## 2017-02-05 NOTE — Progress Notes (Signed)
Cardiac Rehab Medication Review by a Pharmacist  Does the patient  feel that his/her medications are working for him/her?  yes  Has the patient been experiencing any side effects to the medications prescribed?  no  Does the patient measure his/her own blood pressure or blood glucose at home?  yes - FBG daily, no BP  Does the patient have any problems obtaining medications due to transportation or finances?   no  Understanding of regimen: good Understanding of indications: good Potential of compliance: fair    Pharmacist comments: Mr. Pulgarin presents today for medication review. He denies any side effects, but does endorse missing his evening medications about 2-3 times a week on average. He uses a pill box and sets an alarm, but still forgets. He takes his aspirin at night time, so I recommended that he change it to the morning to help him remember. His metoprolol is 3 times a day, so I recommended that he ask his cardiologist if there is a once or twice daily option for him. Patient verbalized understanding.   Gwenlyn Perking, PharmD PGY1 Pharmacy Resident Pager: [phone] 02/05/2017 8:41 AM

## 2017-02-07 ENCOUNTER — Telehealth: Payer: Self-pay | Admitting: Physician Assistant

## 2017-02-07 NOTE — Telephone Encounter (Signed)
Left message for patient to call back  

## 2017-02-07 NOTE — Telephone Encounter (Signed)
Patient made aware of recommendations and he states that he will continue to take the metoprolol 25 mg q8h since he is already used to taking it this way.

## 2017-02-07 NOTE — Telephone Encounter (Signed)
Patient states that he went to orientation for cardiac rehab the other day and spoke with the Pharmacist there and she was questioning on whether the patient should be taking metoprolol three times a day and told him that it is usually prescribed BID and questioning what time he was taking his ASA. He is currently taking 325 mg daily (post CABG) until 04/07/17 and then will resume 81 mg daily. Patient was advised that ASA is usually taken in the morning, but as long as it is taken at the same time everyday it should be fine. The patient takes metoprolol 25 mg q8h. The patient's BP has been running 110-120s/70-80s with a HR 70-80s. Patient wanted to see if he should reduce his metoprolol. Please advise.

## 2017-02-07 NOTE — Telephone Encounter (Signed)
BP and HR look great. If easier to take Twice daily, it is ok to change Metoprolol to 25 mg take 1 and 1/2 tablet (37.5 mg) Twice daily. [name], PA-C   02/07/2017 3:06 PM

## 2017-02-07 NOTE — Telephone Encounter (Signed)
New Message  Pt c/o medication issue:  1. Name of Medication: Metoprolol... Asprin  2. How are you currently taking this medication (dosage and times per day)? 25mg ..... 325mg      3. Are you having a reaction (difficulty breathing--STAT)? no  4. What is your medication issue? Pt would like to know if he needs to continue taking the metoprolol 3 times a day or can he change back to 2 daily. Pt also would like to to know if he needs to take the asprin in the morning or at night. Please call back to discuss

## 2017-02-08 ENCOUNTER — Encounter (HOSPITAL_COMMUNITY): Payer: Self-pay

## 2017-02-08 NOTE — Progress Notes (Signed)
Cardiac Individual Treatment Plan  Patient Details  Name: Andrew Williamson MRN: [id number] Date of Birth: 01/19/1946 Referring Provider:   Flowsheet Row CARDIAC REHAB PHASE II ORIENTATION from 02/05/2017 in [facility]  Referring Provider  Candee Furbish MD      Initial Encounter Date:  [facility] from 02/05/2017 in [facility]  Date  02/05/17  Referring Provider  Candee Furbish MD      Visit Diagnosis: No diagnosis found.  Patient's Home Medications on Admission:  Current Outpatient Prescriptions:  .  aspirin EC 325 MG tablet, Take 1 tablet (325 mg total) by mouth daily., Disp: , Rfl:  .  gabapentin (NEURONTIN) 300 MG capsule, Take 300 mg by mouth 3 (three) times daily. , Disp: , Rfl:  .  lisinopril (PRINIVIL,ZESTRIL) 5 MG tablet, Take 1 tablet (5 mg total) by mouth daily., Disp: 30 tablet, Rfl: 1 .  metFORMIN (GLUCOPHAGE) 500 MG tablet, Take 1 tablet (500 mg total) by mouth 2 (two) times daily with a meal. (Patient taking differently: Take 1,000 mg by mouth 2 (two) times daily with a meal. ), Disp: 30 tablet, Rfl: 1 .  metoprolol tartrate (LOPRESSOR) 25 MG tablet, Take 1 tablet (25 mg total) by mouth every 8 (eight) hours., Disp: 270 tablet, Rfl: 3 .  Multiple Vitamins-Minerals (CENTRUM SILVER PO), Take 1 tablet by mouth daily., Disp: , Rfl:  .  rosuvastatin (CRESTOR) 20 MG tablet, Take 20 mg by mouth daily., Disp: , Rfl:   Past Medical History: Past Medical History:  Diagnosis Date  . Allergic rhinitis   . Coronary artery disease involving native coronary artery of native heart without angina pectoris 01/07/2017   a - Myoview 12/17:  EF 55, inf-lateral ischemia; intermediate risk // b - LHC 12/05/16: oLM 50, oLAD 40, dLAD 50, RI 25, oLCx 95, mLCx 95, RCA luminal irregs // c - Echo 12/17: EF 55-60, no RWMA, Gr 1 DD // d. 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 DJD (degenerative joint disease)    hips  . ED (erectile dysfunction)   . GERD (gastroesophageal reflux disease)   . Gynecomastia, male   . History of echocardiogram    Echo 12/17: EF 55-60, normal wall motion, grade 1 diastolic dysfunction  . History of nuclear stress test    Myoview 12/17: EF 55, inf-lateral ischemia; intermediate risk  . HLD (hyperlipidemia)    hx of ? elevated LFTs on statin Rx  . Hypertension   . Obesity   . Prostate cancer [facility])    s/p radical prostatectomy  . Spinal stenosis     Tobacco Use: History  Smoking Status  . Never Smoker  Smokeless Tobacco  . Current User  . Types: Chew    Labs: Recent Review Flowsheet Data    Labs for ITP Cardiac and Pulmonary Rehab Latest Ref Rng & Units 12/19/2016 12/19/2016 12/19/2016 12/19/2016 12/20/2016   Hemoglobin A1c 4.8 - 5.6 % - - - - -   PHART 7.350 - 7.450 7.342(L) 7.312(L) - 7.443 -   PCO2ART 32.0 - 48.0 mmHg 40.8 38.4 - 38.6 -   HCO3 20.0 - 28.0 mmol/L 22.6 19.4(L) - 26.2 -   TCO2 0 - 100 mmol/L 24 20 21 27 25    ACIDBASEDEF 0.0 - 2.0 mmol/L 3.0(H) 6.0(H) - - -   O2SAT % 96.0 99.0 - 99.0 -  Capillary Blood Glucose: Lab Results  Component Value Date   GLUCAP 110 (H) 12/24/2016   GLUCAP 109 (H) 12/24/2016   GLUCAP 149 (H) 12/24/2016   GLUCAP 146 (H) 12/23/2016   GLUCAP 130 (H) 12/23/2016     Exercise Target Goals:    Exercise Program Goal: Individual exercise prescription set with THRR, safety & activity barriers. Participant demonstrates ability to understand and report RPE using BORG scale, to self-measure pulse accurately, and to acknowledge the importance of the exercise prescription.  Exercise Prescription Goal: Starting with aerobic activity 30 plus minutes a day, 3 days per week for initial exercise prescription. Provide home exercise prescription and guidelines that participant acknowledges understanding prior to discharge.  Activity  Barriers & Risk Stratification:     Activity Barriers & Cardiac Risk Stratification - 02/05/17 1120      Activity Barriers & Cardiac Risk Stratification   Activity Barriers Back Problems   Cardiac Risk Stratification High      6 Minute Walk:     6 Minute Walk    Row Name 02/05/17 1031         6 Minute Walk   Phase Initial     Distance 1400 feet     Walk Time 6 minutes     # of Rest Breaks 0     MPH 2.65     METS 3.07     RPE 10     VO2 Peak 10.74     Symptoms Yes (comment)     Comments Patient c/o dyspnea.     Resting HR 71 bpm     Resting BP 110/57     Max Ex. HR 102 bpm     Max Ex. BP 148/78     2 Minute Post BP 104/60        Oxygen Initial Assessment:   Oxygen Re-Evaluation:   Oxygen Discharge (Final Oxygen Re-Evaluation):   Initial Exercise Prescription:     Initial Exercise Prescription - 02/05/17 1100      Date of Initial Exercise RX and Referring Provider   Date 02/05/17   Referring Provider Candee Furbish MD     Treadmill   MPH 2.3   Grade 1   Minutes 10   METs 3.08     Bike   Level 0.5   Minutes 10   METs 1.99     NuStep   Level 3   SPM 80   Minutes 10   METs 2     Prescription Details   Frequency (times per week) 3   Duration Progress to 30 minutes of continuous aerobic without signs/symptoms of physical distress     Intensity   THRR 40-80% of Max Heartrate 60-120   Ratings of Perceived Exertion 11-13   Perceived Dyspnea 0-4     Progression   Progression Continue to progress workloads to maintain intensity without signs/symptoms of physical distress.     Resistance Training   Training Prescription Yes   Weight 2lbs   Reps 10-15      Perform Capillary Blood Glucose checks as needed.  Exercise Prescription Changes:   Exercise Comments:   Exercise Goals and Review:     Exercise Goals    Row Name 02/05/17 0818             Exercise Goals   Increase Physical Activity Yes       Intervention Provide  advice, education, support and counseling about physical activity/exercise needs.;Develop an individualized exercise prescription for aerobic and  resistive training based on initial evaluation findings, risk stratification, comorbidities and participant's personal goals.       Expected Outcomes Achievement of increased cardiorespiratory fitness and enhanced flexibility, muscular endurance and strength shown through measurements of functional capacity and personal statement of participant.       Increase Strength and Stamina Yes       Intervention Provide advice, education, support and counseling about physical activity/exercise needs.;Develop an individualized exercise prescription for aerobic and resistive training based on initial evaluation findings, risk stratification, comorbidities and participant's personal goals.       Expected Outcomes Achievement of increased cardiorespiratory fitness and enhanced flexibility, muscular endurance and strength shown through measurements of functional capacity and personal statement of participant.          Exercise Goals Re-Evaluation :    Discharge Exercise Prescription (Final Exercise Prescription Changes):   Nutrition:  Target Goals: Understanding of nutrition guidelines, daily intake of sodium 1500mg , cholesterol 200mg , calories 30% from fat and 7% or less from saturated fats, daily to have 5 or more servings of fruits and vegetables.  Biometrics:     Pre Biometrics - 02/05/17 1034      Pre Biometrics   Height 5\' 9"  (1.753 m)   Weight 207 lb 0.2 oz (93.9 kg)   Waist Circumference 41.5 inches   Hip Circumference 44.5 inches   Waist to Hip Ratio 0.93 %   BMI (Calculated) 30.6   Triceps Skinfold 35 mm   % Body Fat 32.8 %   Grip Strength 45 kg   Flexibility 8 in   Single Leg Stand 9.4 seconds       Nutrition Therapy Plan and Nutrition Goals:   Nutrition Discharge: Nutrition Scores:   Nutrition Goals Re-Evaluation:   Nutrition  Goals Re-Evaluation:   Nutrition Goals Discharge (Final Nutrition Goals Re-Evaluation):   Psychosocial: Target Goals: Acknowledge presence or absence of significant depression and/or stress, maximize coping skills, provide positive support system. Participant is able to verbalize types and ability to use techniques and skills needed for reducing stress and depression.  Initial Review & Psychosocial Screening:     Initial Psych Review & Screening - 02/05/17 1220      Initial Review   Current issues with None Identified     Family Dynamics   Good Support System? Yes  daugheters, brothers and friends    Comments Upon brief assessment, no psychosocial needs identified, no interventions necessary      Barriers   Psychosocial barriers to participate in program There are no identifiable barriers or psychosocial needs.     Screening Interventions   Interventions Encouraged to exercise;Provide feedback about the scores to participant      Quality of Life Scores:     Quality of Life - 02/05/17 1006      Quality of Life Scores   Health/Function Pre 26.93 %   Socioeconomic Pre 28.07 %   Psych/Spiritual Pre 28.18 %   Family Pre 28.5 %   GLOBAL Pre 27.65 %      PHQ-9: Recent Review Flowsheet Data    There is no flowsheet data to display.     Interpretation of Total Score  Total Score Depression Severity:  1-4 = Minimal depression, 5-9 = Mild depression, 10-14 = Moderate depression, 15-19 = Moderately severe depression, 20-27 = Severe depression   Psychosocial Evaluation and Intervention:   Psychosocial Re-Evaluation:   Psychosocial Discharge (Final Psychosocial Re-Evaluation):   Vocational Rehabilitation: Provide vocational rehab assistance to qualifying candidates.  Vocational Rehab Evaluation & Intervention:     Vocational Rehab - 02/05/17 1219      Initial Vocational Rehab Evaluation & Intervention   Assessment shows need for Vocational Rehabilitation No       Education: Education Goals: Education classes will be provided on a weekly basis, covering required topics. Participant will state understanding/return demonstration of topics presented.  Learning Barriers/Preferences:     Learning Barriers/Preferences - 02/05/17 0818      Learning Barriers/Preferences   Learning Barriers Sight   Learning Preferences Written Material;Skilled Demonstration;Pictoral;Verbal Instruction;Video      Education Topics: Count Your Pulse:  -Group instruction provided by verbal instruction, demonstration, patient participation and written materials to support subject.  Instructors address importance of being able to find your pulse and how to count your pulse when at home without a heart monitor.  Patients get hands on experience counting their pulse with staff help and individually.   Heart Attack, Angina, and Risk Factor Modification:  -Group instruction provided by verbal instruction, video, and written materials to support subject.  Instructors address signs and symptoms of angina and heart attacks.    Also discuss risk factors for heart disease and how to make changes to improve heart health risk factors.   Functional Fitness:  -Group instruction provided by verbal instruction, demonstration, patient participation, and written materials to support subject.  Instructors address safety measures for doing things around the house.  Discuss how to get up and down off the floor, how to pick things up properly, how to safely get out of a chair without assistance, and balance training.   Meditation and Mindfulness:  -Group instruction provided by verbal instruction, patient participation, and written materials to support subject.  Instructor addresses importance of mindfulness and meditation practice to help reduce stress and improve awareness.  Instructor also leads participants through a meditation exercise.    Stretching for Flexibility and Mobility:   -Group instruction provided by verbal instruction, patient participation, and written materials to support subject.  Instructors lead participants through series of stretches that are designed to increase flexibility thus improving mobility.  These stretches are additional exercise for major muscle groups that are typically performed during regular warm up and cool down.   Hands Only CPR Anytime:  -Group instruction provided by verbal instruction, video, patient participation and written materials to support subject.  Instructors co-teach with AHA video for hands only CPR.  Participants get hands on experience with mannequins.   Nutrition I class: Heart Healthy Eating:  -Group instruction provided by PowerPoint slides, verbal discussion, and written materials to support subject matter. The instructor gives an explanation and review of the Therapeutic Lifestyle Changes diet recommendations, which includes a discussion on lipid goals, dietary fat, sodium, fiber, plant stanol/sterol esters, sugar, and the components of a well-balanced, healthy diet.   Nutrition II class: Lifestyle Skills:  -Group instruction provided by PowerPoint slides, verbal discussion, and written materials to support subject matter. The instructor gives an explanation and review of label reading, grocery shopping for heart health, heart healthy recipe modifications, and ways to make healthier choices when eating out.   Diabetes Question & Answer:  -Group instruction provided by PowerPoint slides, verbal discussion, and written materials to support subject matter. The instructor gives an explanation and review of diabetes co-morbidities, pre- and post-prandial blood glucose goals, pre-exercise blood glucose goals, signs, symptoms, and treatment of hypoglycemia and hyperglycemia, and foot care basics.   Diabetes Blitz:  -Group instruction provided by [employer], verbal  discussion, and written materials to support  subject matter. The instructor gives an explanation and review of the physiology behind type 1 and type 2 diabetes, diabetes medications and rational behind using different medications, pre- and post-prandial blood glucose recommendations and Hemoglobin A1c goals, diabetes diet, and exercise including blood glucose guidelines for exercising safely.    Portion Distortion:  -Group instruction provided by PowerPoint slides, verbal discussion, written materials, and food models to support subject matter. The instructor gives an explanation of serving size versus portion size, changes in portions sizes over the last 20 years, and what consists of a serving from each food group.   Stress Management:  -Group instruction provided by verbal instruction, video, and written materials to support subject matter.  Instructors review role of stress in heart disease and how to cope with stress positively.     Exercising on Your Own:  -Group instruction provided by verbal instruction, power point, and written materials to support subject.  Instructors discuss benefits of exercise, components of exercise, frequency and intensity of exercise, and end points for exercise.  Also discuss use of nitroglycerin and activating EMS.  Review options of places to exercise outside of rehab.  Review guidelines for sex with heart disease.   Cardiac Drugs I:  -Group instruction provided by verbal instruction and written materials to support subject.  Instructor reviews cardiac drug classes: antiplatelets, anticoagulants, beta blockers, and statins.  Instructor discusses reasons, side effects, and lifestyle considerations for each drug class.   Cardiac Drugs II:  -Group instruction provided by verbal instruction and written materials to support subject.  Instructor reviews cardiac drug classes: angiotensin converting enzyme inhibitors (ACE-I), angiotensin II receptor blockers (ARBs), nitrates, and calcium channel blockers.   Instructor discusses reasons, side effects, and lifestyle considerations for each drug class.   Anatomy and Physiology of the Circulatory System:  -Group instruction provided by verbal instruction, video, and written materials to support subject.  Reviews functional anatomy of heart, how it relates to various diagnoses, and what role the heart plays in the overall system.   Knowledge Questionnaire Score:     Knowledge Questionnaire Score - 02/05/17 1006      Knowledge Questionnaire Score   Pre Score 22/24      Core Components/Risk Factors/Patient Goals at Admission:     Personal Goals and Risk Factors at Admission - 02/05/17 1124      Core Components/Risk Factors/Patient Goals on Admission    Weight Management Yes;Obesity;Weight Loss;Weight Maintenance   Intervention Weight Management: Develop a combined nutrition and exercise program designed to reach desired caloric intake, while maintaining appropriate intake of nutrient and fiber, sodium and fats, and appropriate energy expenditure required for the weight goal.;Weight Management: Provide education and appropriate resources to help participant work on and attain dietary goals.;Weight Management/Obesity: Establish reasonable short term and long term weight goals.;Obesity: Provide education and appropriate resources to help participant work on and attain dietary goals.   Expected Outcomes Short Term: Continue to assess and modify interventions until short term weight is achieved;Long Term: Adherence to nutrition and physical activity/exercise program aimed toward attainment of established weight goal;Weight Maintenance: Understanding of the daily nutrition guidelines, which includes 25-35% calories from fat, 7% or less cal from saturated fats, less than 200mg  cholesterol, less than 1.5gm of sodium, & 5 or more servings of fruits and vegetables daily;Weight Loss: Understanding of general recommendations for a balanced deficit meal plan,  which promotes 1-2 lb weight loss per week and includes a negative energy balance  of [phone] kcal/d;Understanding recommendations for meals to include 15-35% energy as protein, 25-35% energy from fat, 35-60% energy from carbohydrates, less than 200mg  of dietary cholesterol, 20-35 gm of total fiber daily;Understanding of distribution of calorie intake throughout the day with the consumption of 4-5 meals/snacks   Diabetes Yes   Intervention Provide education about signs/symptoms and action to take for hypo/hyperglycemia.;Provide education about proper nutrition, including hydration, and aerobic/resistive exercise prescription along with prescribed medications to achieve blood glucose in normal ranges: Fasting glucose 65-99 mg/dL   Expected Outcomes Long Term: Attainment of HbA1C < 7%.;Short Term: Participant verbalizes understanding of the signs/symptoms and immediate care of hyper/hypoglycemia, proper foot care and importance of medication, aerobic/resistive exercise and nutrition plan for blood glucose control.   Hypertension Yes   Intervention Provide education on lifestyle modifcations including regular physical activity/exercise, weight management, moderate sodium restriction and increased consumption of fresh fruit, vegetables, and low fat dairy, alcohol moderation, and smoking cessation.;Monitor prescription use compliance.   Expected Outcomes Short Term: Continued assessment and intervention until BP is < 140/4mm HG in hypertensive participants. < 130/44mm HG in hypertensive participants with diabetes, heart failure or chronic kidney disease.;Long Term: Maintenance of blood pressure at goal levels.   Lipids Yes   Intervention Provide education and support for participant on nutrition & aerobic/resistive exercise along with prescribed medications to achieve LDL 70mg , HDL >40mg .   Expected Outcomes Short Term: Participant states understanding of desired cholesterol values and is compliant with  medications prescribed. Participant is following exercise prescription and nutrition guidelines.;Long Term: Cholesterol controlled with medications as prescribed, with individualized exercise RX and with personalized nutrition plan. Value goals: LDL < 70mg , HDL > 40 mg.   Personal Goal Other Yes   Personal Goal Be able to get back to playing golf without being limited by back pain, Long term: lose weight, goal at 185-190lbs   Intervention Provide exercise programming and education on nutrition couseling to assist with weightloss and improving cardiorespiratory fitness.   Expected Outcomes Pt will be able to return to hobbies of bowling, playing golf and square dancing without being limited by back pain and endurance.      Core Components/Risk Factors/Patient Goals Review:    Core Components/Risk Factors/Patient Goals at Discharge (Final Review):    ITP Comments:     ITP Comments    Row Name 02/05/17 0815           ITP Comments Dr. Fransico Him, Medical Director          Comments: pt is scheduled to begin group exercise sessions 02/11/17.   Recommend continued exercise and life style modification education including  stress management and relaxation techniques to decrease cardiac risk profile.

## 2017-02-11 ENCOUNTER — Encounter (HOSPITAL_COMMUNITY): Payer: Self-pay

## 2017-02-11 ENCOUNTER — Telehealth (HOSPITAL_COMMUNITY): Payer: Self-pay | Admitting: Cardiac Rehabilitation

## 2017-02-11 ENCOUNTER — Encounter (HOSPITAL_COMMUNITY)
Admission: RE | Admit: 2017-02-11 | Discharge: 2017-02-11 | Disposition: A | Discharge status: Home / Self Care | Payer: Medicare Other | Source: Ambulatory Visit | Attending: Cardiology | Admitting: Cardiology

## 2017-02-11 ENCOUNTER — Ambulatory Visit (INDEPENDENT_AMBULATORY_CARE_PROVIDER_SITE_OTHER): Payer: Self-pay | Admitting: Physician Assistant

## 2017-02-11 VITALS — BP 109/72 | HR 70 | Resp 16 | Ht 69.0 in | Wt 208.3 lb

## 2017-02-11 DIAGNOSIS — E785 Hyperlipidemia, unspecified: Secondary | ICD-10-CM | POA: Insufficient documentation

## 2017-02-11 DIAGNOSIS — E119 Type 2 diabetes mellitus without complications: Secondary | ICD-10-CM | POA: Diagnosis not present

## 2017-02-11 DIAGNOSIS — K219 Gastro-esophageal reflux disease without esophagitis: Secondary | ICD-10-CM | POA: Insufficient documentation

## 2017-02-11 DIAGNOSIS — Z7982 Long term (current) use of aspirin: Secondary | ICD-10-CM | POA: Insufficient documentation

## 2017-02-11 DIAGNOSIS — I2510 Atherosclerotic heart disease of native coronary artery without angina pectoris: Secondary | ICD-10-CM | POA: Insufficient documentation

## 2017-02-11 DIAGNOSIS — Z951 Presence of aortocoronary bypass graft: Secondary | ICD-10-CM | POA: Insufficient documentation

## 2017-02-11 DIAGNOSIS — E669 Obesity, unspecified: Secondary | ICD-10-CM | POA: Insufficient documentation

## 2017-02-11 DIAGNOSIS — N62 Hypertrophy of breast: Secondary | ICD-10-CM | POA: Insufficient documentation

## 2017-02-11 DIAGNOSIS — I10 Essential (primary) hypertension: Secondary | ICD-10-CM | POA: Diagnosis not present

## 2017-02-11 DIAGNOSIS — M160 Bilateral primary osteoarthritis of hip: Secondary | ICD-10-CM | POA: Insufficient documentation

## 2017-02-11 DIAGNOSIS — Z8546 Personal history of malignant neoplasm of prostate: Secondary | ICD-10-CM | POA: Diagnosis not present

## 2017-02-11 DIAGNOSIS — N529 Male erectile dysfunction, unspecified: Secondary | ICD-10-CM | POA: Diagnosis not present

## 2017-02-11 LAB — GLUCOSE, CAPILLARY
GLUCOSE-CAPILLARY: 177 mg/dL — AB (ref 65–99)
Glucose-Capillary: 209 mg/dL — ABNORMAL HIGH (ref 65–99)

## 2017-02-11 NOTE — Progress Notes (Signed)
HPI:  Andrew Williamson is S/P CABG performed on 12/19/2016.  He has done very well post operatively.  He was last evaluated in our office on 01/28/2017 at which time was instructed to return prn.  He contacted the office today with complaints of sternal drainage along the lower portion of his sternum.  He states the wound had been fine.  He was continuing diligent wound care.  However, he states over the past several days he developed mild clear drainage from the area with a small opening.   Current Outpatient Prescriptions  Medication Sig Dispense Refill  . aspirin EC 325 MG tablet Take 1 tablet (325 mg total) by mouth daily.    Marland Kitchen gabapentin (NEURONTIN) 300 MG capsule Take 300 mg by mouth 3 (three) times daily.     Marland Kitchen lisinopril (PRINIVIL,ZESTRIL) 5 MG tablet Take 1 tablet (5 mg total) by mouth daily. 30 tablet 1  . metFORMIN (GLUCOPHAGE) 500 MG tablet Take 1 tablet (500 mg total) by mouth 2 (two) times daily with a meal. (Patient taking differently: Take 1,000 mg by mouth 2 (two) times daily with a meal. ) 30 tablet 1  . metoprolol tartrate (LOPRESSOR) 25 MG tablet Take 1 tablet (25 mg total) by mouth every 8 (eight) hours. 270 tablet 3  . Multiple Vitamins-Minerals (CENTRUM SILVER PO) Take 1 tablet by mouth daily.    . rosuvastatin (CRESTOR) 20 MG tablet Take 10 mg by mouth. 1/2 tab daily     No current facility-administered medications for this visit.     Physical Exam:  Gen: no apparent distress Heart: RRR Lungs: CTA Wound: small <10 cm opening in middle of sternum, minimal serous drainage present... There is no evidence of infection  A/P:  1. Sternal opening- wound is very clean and well healed except small opening in middle of sternotomy.  There is no evidence of infection.  Wound does not track... This is likely due to old sutures that have not dissolved.  Continue wound care will follow up in 1 week.. If wound continues may need to open wound to try and remove suture.     Andrew Handler, PA-C [facility] [phone]

## 2017-02-11 NOTE — Progress Notes (Signed)
Daily Session Note  Patient Details  Name: Andrew Williamson MRN: [id number] Date of Birth: September 17, 1946 Referring Provider:   Flowsheet Row CARDIAC REHAB PHASE II ORIENTATION from 02/05/2017 in [place]  Referring Provider  Candee Furbish MD      Encounter Date: 02/11/2017  Check In:     Session Check In - 02/11/17 0839      Check-In   Location [facility]   Staff Present Andi Hence, RN, BSN;Molly diVincenzo, MS, ACSM RCEP, Exercise Physiologist   Supervising physician immediately available to respond to emergencies [facility] immediately available   Physician(s) Dr. Candiss Norse    Medication changes reported     No   Fall or balance concerns reported    No   Tobacco Cessation No Change   Warm-up and Cool-down Performed as group-led instruction   VAD Patient? No     Pain Assessment   Currently in Pain? No/denies      Capillary Blood Glucose: Results for orders placed or performed during the hospital encounter of 02/11/17 (from the past 24 hour(s))  Glucose, capillary     Status: Abnormal   Collection Time: 02/11/17  8:20 AM  Result Value Ref Range   Glucose-Capillary 209 (H) 65 - 99 mg/dL  Glucose, capillary     Status: Abnormal   Collection Time: 02/11/17  9:06 AM  Result Value Ref Range   Glucose-Capillary 177 (H) 65 - 99 mg/dL      History  Smoking Status  . Never Smoker  Smokeless Tobacco  . Current User  . Types: Chew    Goals Met:  Exercise tolerated well  Goals Unmet:  Not Applicable  Comments: Pt started cardiac rehab today.  Pt tolerated light exercise without difficulty. VSS, telemetry-sinus rhythm,  asymptomatic.  Medication list reconciled. Pt denies barriers to medicaiton compliance.  PSYCHOSOCIAL ASSESSMENT:  PHQ-0. Pt exhibits positive coping skills, hopeful outlook with supportive family. No psychosocial needs identified at this time, no psychosocial interventions necessary.    Pt enjoys playing  golf, bowling and square dancing which is looking forward to resuming soon.  Pt currently is reading and "eating" for enjoyment. Pt personal goals for cardiac rehab are to improve heart health, increase strength/stamina and weight loss.  Pt encouraged to participate in cardiac rehab activities with home exercise and education classes, including nutrition classes to increase ability to meet these goals.    Pt oriented to exercise equipment and routine.    Understanding verbalized.   Dr. Fransico Him is Medical Director for Cardiac Rehab at [facility].

## 2017-02-11 NOTE — Telephone Encounter (Signed)
Pc to  Dr. Everrett Coombe office to report sternal incision drainage.  Pt reports drainage is persistent over past few weeks.  This is noted in most recent [facility] office note 01/28/2017.  Upon inspection, bandaid removed., scant serosanguinous drainage present on bandaid. Small (approximately 57mm) open area present.  serosanguinous drainage present.  No redness, swelling, pain.  Pt reports he has been cleansing daily with soap, water and peroxide as directed.  Area cleaned with peroxide, bandaid reapplied. Andrew Williamson  notified, she will review with Dr. Servando Snare and further advise pt.

## 2017-02-13 ENCOUNTER — Encounter (HOSPITAL_COMMUNITY)
Admission: RE | Admit: 2017-02-13 | Discharge: 2017-02-13 | Disposition: A | Discharge status: Home / Self Care | Payer: Medicare Other | Source: Ambulatory Visit | Attending: Cardiology | Admitting: Cardiology

## 2017-02-13 DIAGNOSIS — I2510 Atherosclerotic heart disease of native coronary artery without angina pectoris: Secondary | ICD-10-CM | POA: Diagnosis not present

## 2017-02-13 DIAGNOSIS — K219 Gastro-esophageal reflux disease without esophagitis: Secondary | ICD-10-CM | POA: Diagnosis not present

## 2017-02-13 DIAGNOSIS — Z951 Presence of aortocoronary bypass graft: Secondary | ICD-10-CM

## 2017-02-13 DIAGNOSIS — E119 Type 2 diabetes mellitus without complications: Secondary | ICD-10-CM | POA: Diagnosis not present

## 2017-02-13 DIAGNOSIS — M160 Bilateral primary osteoarthritis of hip: Secondary | ICD-10-CM | POA: Diagnosis not present

## 2017-02-13 DIAGNOSIS — Z7982 Long term (current) use of aspirin: Secondary | ICD-10-CM | POA: Diagnosis not present

## 2017-02-13 LAB — GLUCOSE, CAPILLARY
Glucose-Capillary: 197 mg/dL — ABNORMAL HIGH (ref 65–99)
Glucose-Capillary: 98 mg/dL (ref 65–99)

## 2017-02-14 ENCOUNTER — Encounter: Payer: Self-pay | Admitting: Physician Assistant

## 2017-02-15 ENCOUNTER — Encounter (HOSPITAL_COMMUNITY)
Admission: RE | Admit: 2017-02-15 | Discharge: 2017-02-15 | Disposition: A | Discharge status: Home / Self Care | Payer: Medicare Other | Source: Ambulatory Visit | Attending: Cardiology | Admitting: Cardiology

## 2017-02-15 DIAGNOSIS — E119 Type 2 diabetes mellitus without complications: Secondary | ICD-10-CM | POA: Diagnosis not present

## 2017-02-15 DIAGNOSIS — Z7982 Long term (current) use of aspirin: Secondary | ICD-10-CM | POA: Diagnosis not present

## 2017-02-15 DIAGNOSIS — Z951 Presence of aortocoronary bypass graft: Secondary | ICD-10-CM | POA: Diagnosis not present

## 2017-02-15 DIAGNOSIS — M160 Bilateral primary osteoarthritis of hip: Secondary | ICD-10-CM | POA: Diagnosis not present

## 2017-02-15 DIAGNOSIS — I2510 Atherosclerotic heart disease of native coronary artery without angina pectoris: Secondary | ICD-10-CM | POA: Diagnosis not present

## 2017-02-15 DIAGNOSIS — K219 Gastro-esophageal reflux disease without esophagitis: Secondary | ICD-10-CM | POA: Diagnosis not present

## 2017-02-15 LAB — GLUCOSE, CAPILLARY: GLUCOSE-CAPILLARY: 204 mg/dL — AB (ref 65–99)

## 2017-02-18 ENCOUNTER — Encounter (HOSPITAL_COMMUNITY)
Admission: RE | Admit: 2017-02-18 | Discharge: 2017-02-18 | Disposition: A | Discharge status: Home / Self Care | Payer: Medicare Other | Source: Ambulatory Visit | Attending: Cardiology | Admitting: Cardiology

## 2017-02-18 ENCOUNTER — Ambulatory Visit (INDEPENDENT_AMBULATORY_CARE_PROVIDER_SITE_OTHER): Payer: Self-pay | Admitting: *Deleted

## 2017-02-18 DIAGNOSIS — Z951 Presence of aortocoronary bypass graft: Secondary | ICD-10-CM

## 2017-02-18 DIAGNOSIS — M160 Bilateral primary osteoarthritis of hip: Secondary | ICD-10-CM | POA: Diagnosis not present

## 2017-02-18 DIAGNOSIS — E119 Type 2 diabetes mellitus without complications: Secondary | ICD-10-CM | POA: Diagnosis not present

## 2017-02-18 DIAGNOSIS — Z7982 Long term (current) use of aspirin: Secondary | ICD-10-CM | POA: Diagnosis not present

## 2017-02-18 DIAGNOSIS — Z4802 Encounter for removal of sutures: Secondary | ICD-10-CM

## 2017-02-18 DIAGNOSIS — K219 Gastro-esophageal reflux disease without esophagitis: Secondary | ICD-10-CM | POA: Diagnosis not present

## 2017-02-18 DIAGNOSIS — I2510 Atherosclerotic heart disease of native coronary artery without angina pectoris: Secondary | ICD-10-CM

## 2017-02-18 LAB — GLUCOSE, CAPILLARY: Glucose-Capillary: 137 mg/dL — ABNORMAL HIGH (ref 65–99)

## 2017-02-18 NOTE — Progress Notes (Signed)
Andrew Williamson returns for a check of his sternal incision. He had been having some slight serosanguinous drainage from a small opening mid incision. Today there is no drainage and no stitch can be seen. I felt it can just be cleaned with soap and water and left open to air. He will return prn. He agrees.

## 2017-02-20 ENCOUNTER — Encounter (HOSPITAL_COMMUNITY)
Admission: RE | Admit: 2017-02-20 | Discharge: 2017-02-20 | Disposition: A | Discharge status: Home / Self Care | Payer: Medicare Other | Source: Ambulatory Visit | Attending: Cardiology | Admitting: Cardiology

## 2017-02-20 DIAGNOSIS — E119 Type 2 diabetes mellitus without complications: Secondary | ICD-10-CM | POA: Diagnosis not present

## 2017-02-20 DIAGNOSIS — Z951 Presence of aortocoronary bypass graft: Secondary | ICD-10-CM | POA: Diagnosis not present

## 2017-02-20 DIAGNOSIS — K219 Gastro-esophageal reflux disease without esophagitis: Secondary | ICD-10-CM | POA: Diagnosis not present

## 2017-02-20 DIAGNOSIS — Z7982 Long term (current) use of aspirin: Secondary | ICD-10-CM | POA: Diagnosis not present

## 2017-02-20 DIAGNOSIS — M160 Bilateral primary osteoarthritis of hip: Secondary | ICD-10-CM | POA: Diagnosis not present

## 2017-02-20 DIAGNOSIS — I2510 Atherosclerotic heart disease of native coronary artery without angina pectoris: Secondary | ICD-10-CM | POA: Diagnosis not present

## 2017-02-22 ENCOUNTER — Encounter (HOSPITAL_COMMUNITY)
Admission: RE | Admit: 2017-02-22 | Discharge: 2017-02-22 | Disposition: A | Discharge status: Home / Self Care | Payer: Medicare Other | Source: Ambulatory Visit | Attending: Cardiology | Admitting: Cardiology

## 2017-02-22 DIAGNOSIS — Z951 Presence of aortocoronary bypass graft: Secondary | ICD-10-CM | POA: Diagnosis not present

## 2017-02-22 DIAGNOSIS — M160 Bilateral primary osteoarthritis of hip: Secondary | ICD-10-CM | POA: Diagnosis not present

## 2017-02-22 DIAGNOSIS — I2510 Atherosclerotic heart disease of native coronary artery without angina pectoris: Secondary | ICD-10-CM | POA: Diagnosis not present

## 2017-02-22 DIAGNOSIS — K219 Gastro-esophageal reflux disease without esophagitis: Secondary | ICD-10-CM | POA: Diagnosis not present

## 2017-02-22 DIAGNOSIS — Z7982 Long term (current) use of aspirin: Secondary | ICD-10-CM | POA: Diagnosis not present

## 2017-02-22 DIAGNOSIS — E119 Type 2 diabetes mellitus without complications: Secondary | ICD-10-CM | POA: Diagnosis not present

## 2017-02-25 ENCOUNTER — Encounter (HOSPITAL_COMMUNITY)
Admission: RE | Admit: 2017-02-25 | Discharge: 2017-02-25 | Disposition: A | Discharge status: Home / Self Care | Payer: Medicare Other | Source: Ambulatory Visit | Attending: Cardiology | Admitting: Cardiology

## 2017-02-25 DIAGNOSIS — K219 Gastro-esophageal reflux disease without esophagitis: Secondary | ICD-10-CM | POA: Diagnosis not present

## 2017-02-25 DIAGNOSIS — I2510 Atherosclerotic heart disease of native coronary artery without angina pectoris: Secondary | ICD-10-CM | POA: Diagnosis not present

## 2017-02-25 DIAGNOSIS — Z951 Presence of aortocoronary bypass graft: Secondary | ICD-10-CM

## 2017-02-25 DIAGNOSIS — Z7982 Long term (current) use of aspirin: Secondary | ICD-10-CM | POA: Diagnosis not present

## 2017-02-25 DIAGNOSIS — E119 Type 2 diabetes mellitus without complications: Secondary | ICD-10-CM | POA: Diagnosis not present

## 2017-02-25 DIAGNOSIS — M160 Bilateral primary osteoarthritis of hip: Secondary | ICD-10-CM | POA: Diagnosis not present

## 2017-02-25 NOTE — Progress Notes (Signed)
Reviewed home exercise with pt today.  Pt plans to attend [employer] in which he will use recumbent bike and treadmill for exercise 2-3x/week in addition to coming to cardiac rehab. Reviewed THR, pulse, RPE, sign and symptoms, NTG use, and when to call 911 or MD.  Also discussed weather considerations and indoor options.  Pt voiced understanding.    [employer] ACSM RCEP

## 2017-02-27 ENCOUNTER — Encounter (HOSPITAL_COMMUNITY)
Admission: RE | Admit: 2017-02-27 | Discharge: 2017-02-27 | Disposition: A | Discharge status: Home / Self Care | Payer: Medicare Other | Source: Ambulatory Visit | Attending: Cardiology | Admitting: Cardiology

## 2017-02-27 DIAGNOSIS — Z951 Presence of aortocoronary bypass graft: Secondary | ICD-10-CM

## 2017-02-27 DIAGNOSIS — M160 Bilateral primary osteoarthritis of hip: Secondary | ICD-10-CM | POA: Diagnosis not present

## 2017-02-27 DIAGNOSIS — Z7982 Long term (current) use of aspirin: Secondary | ICD-10-CM | POA: Diagnosis not present

## 2017-02-27 DIAGNOSIS — E119 Type 2 diabetes mellitus without complications: Secondary | ICD-10-CM | POA: Diagnosis not present

## 2017-02-27 DIAGNOSIS — I2510 Atherosclerotic heart disease of native coronary artery without angina pectoris: Secondary | ICD-10-CM | POA: Diagnosis not present

## 2017-02-27 DIAGNOSIS — K219 Gastro-esophageal reflux disease without esophagitis: Secondary | ICD-10-CM | POA: Diagnosis not present

## 2017-03-01 ENCOUNTER — Encounter (HOSPITAL_COMMUNITY)
Admission: RE | Admit: 2017-03-01 | Discharge: 2017-03-01 | Disposition: A | Discharge status: Home / Self Care | Payer: Medicare Other | Source: Ambulatory Visit | Attending: Cardiology | Admitting: Cardiology

## 2017-03-01 DIAGNOSIS — Z951 Presence of aortocoronary bypass graft: Secondary | ICD-10-CM | POA: Diagnosis not present

## 2017-03-01 DIAGNOSIS — Z7982 Long term (current) use of aspirin: Secondary | ICD-10-CM | POA: Diagnosis not present

## 2017-03-01 DIAGNOSIS — I2510 Atherosclerotic heart disease of native coronary artery without angina pectoris: Secondary | ICD-10-CM | POA: Diagnosis not present

## 2017-03-01 DIAGNOSIS — M160 Bilateral primary osteoarthritis of hip: Secondary | ICD-10-CM | POA: Diagnosis not present

## 2017-03-01 DIAGNOSIS — K219 Gastro-esophageal reflux disease without esophagitis: Secondary | ICD-10-CM | POA: Diagnosis not present

## 2017-03-01 DIAGNOSIS — E119 Type 2 diabetes mellitus without complications: Secondary | ICD-10-CM | POA: Diagnosis not present

## 2017-03-01 NOTE — Progress Notes (Signed)
Andrew Williamson 71 y.o. male Nutrition Note Spoke with pt. Nutrition Plan and Nutrition Survey goals reviewed with pt. Pt is following Step 1 of the Therapeutic Lifestyle Changes diet. Pt eats out for breakfast and dinner daily and eats a "snack" for lunch. Pt reports he eats out "because I cook too much and waste food if I cook for myself." Pt is not interested in changing eating out behavior at this time. Pt is diabetic. Last A1c indicates blood glucose well-controlled. Pt states his usual A1c has been 6.1. Metformin reportedly decreased after his surgery and has been increased after his A1c at his physical increased to 6.5.  This Probation officer went over Diabetes Education test results. Pt checks CBG's once daily. Fasting CBG's reportedly 105-135 mg/dL. Pt expressed understanding of the information reviewed. Pt aware of nutrition education classes offered and plans on attending nutrition classes.  Lab Results  Component Value Date   HGBA1C 6.4 (H) 12/17/2016   Wt Readings from Last 3 Encounters:  02/11/17 208 lb 5.4 oz (94.5 kg)  02/05/17 207 lb 0.2 oz (93.9 kg)  01/28/17 202 lb (91.6 kg)    Nutrition Diagnosis ? Food-and nutrition-related knowledge deficit related to lack of exposure to information as related to diagnosis of: ? CVD ? DM ? Obesity related to excessive energy intake as evidenced by a BMI of 30.6  Nutrition Intervention ? Pt's individual nutrition plan reviewed with pt. ? Benefits of adopting Therapeutic Lifestyle Changes discussed when Medficts reviewed. ? Pt to attend the Portion Distortion class ? Pt to attend the Diabetes Q & A class ? Pt to attend the   ? Nutrition I class                  ? Nutrition II class     ? Diabetes Blitz class ? Continue client-centered nutrition education by RD, as part of interdisciplinary care. Goal(s) ? Pt to identify and limit food sources of saturated fat, trans fat, and sodium ? Pt to identify food quantities necessary to achieve weight  loss of 6-24 lb (2.7-10.9 kg) at graduation from cardiac rehab.  ? CBG concentrations in the normal range or as close to normal as is safely possible. Monitor and Evaluate progress toward nutrition goal with team. Derek Mound, M.Ed, RD, LDN, CDE 03/01/2017 9:35 AM

## 2017-03-04 ENCOUNTER — Encounter (HOSPITAL_COMMUNITY)
Admission: RE | Admit: 2017-03-04 | Discharge: 2017-03-04 | Disposition: A | Discharge status: Home / Self Care | Payer: Medicare Other | Source: Ambulatory Visit | Attending: Cardiology | Admitting: Cardiology

## 2017-03-04 DIAGNOSIS — Z951 Presence of aortocoronary bypass graft: Secondary | ICD-10-CM | POA: Diagnosis not present

## 2017-03-04 DIAGNOSIS — E119 Type 2 diabetes mellitus without complications: Secondary | ICD-10-CM | POA: Diagnosis not present

## 2017-03-04 DIAGNOSIS — K219 Gastro-esophageal reflux disease without esophagitis: Secondary | ICD-10-CM | POA: Diagnosis not present

## 2017-03-04 DIAGNOSIS — M160 Bilateral primary osteoarthritis of hip: Secondary | ICD-10-CM | POA: Diagnosis not present

## 2017-03-04 DIAGNOSIS — I2510 Atherosclerotic heart disease of native coronary artery without angina pectoris: Secondary | ICD-10-CM | POA: Diagnosis not present

## 2017-03-04 DIAGNOSIS — Z7982 Long term (current) use of aspirin: Secondary | ICD-10-CM | POA: Diagnosis not present

## 2017-03-04 LAB — GLUCOSE, CAPILLARY: GLUCOSE-CAPILLARY: 102 mg/dL — AB (ref 65–99)

## 2017-03-05 NOTE — Progress Notes (Signed)
Cardiac Individual Treatment Plan  Patient Details  Name: Andrew Williamson MRN: [id number] Date of Birth: Mar 12, 1946 Referring Provider:     CARDIAC REHAB PHASE II ORIENTATION from 02/05/2017 in [place]  Referring Provider  Candee Furbish MD      Initial Encounter Date:    CARDIAC REHAB PHASE II ORIENTATION from 02/05/2017 in [place]  Date  02/05/17  Referring Provider  Candee Furbish MD      Visit Diagnosis: No diagnosis found.  Patient's Home Medications on Admission:  Current Outpatient Prescriptions:  .  aspirin EC 325 MG tablet, Take 1 tablet (325 mg total) by mouth daily., Disp: , Rfl:  .  gabapentin (NEURONTIN) 300 MG capsule, Take 300 mg by mouth 3 (three) times daily. , Disp: , Rfl:  .  lisinopril (PRINIVIL,ZESTRIL) 5 MG tablet, Take 1 tablet (5 mg total) by mouth daily., Disp: 30 tablet, Rfl: 1 .  metFORMIN (GLUCOPHAGE) 500 MG tablet, Take 1 tablet (500 mg total) by mouth 2 (two) times daily with a meal. (Patient taking differently: Take 1,000 mg by mouth 2 (two) times daily with a meal. ), Disp: 30 tablet, Rfl: 1 .  metoprolol tartrate (LOPRESSOR) 25 MG tablet, Take 1 tablet (25 mg total) by mouth every 8 (eight) hours., Disp: 270 tablet, Rfl: 3 .  Multiple Vitamins-Minerals (CENTRUM SILVER PO), Take 1 tablet by mouth daily., Disp: , Rfl:  .  rosuvastatin (CRESTOR) 20 MG tablet, Take 10 mg by mouth. 1/2 tab daily, Disp: , Rfl:   Past Medical History: Past Medical History:  Diagnosis Date  . Allergic rhinitis   . Coronary artery disease involving native coronary artery of native heart without angina pectoris 01/07/2017   a - Myoview 12/17:  EF 55, inf-lateral ischemia; intermediate risk // b - LHC 12/05/16: oLM 50, oLAD 40, dLAD 50, RI 25, oLCx 95, mLCx 95, RCA luminal irregs // c - Echo 12/17: EF 55-60, no RWMA, Gr 1 DD // d. 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 Diabetes mellitus without complication (HCC)     with microalbuminuria  . DJD (degenerative joint disease)    hips  . ED (erectile dysfunction)   . GERD (gastroesophageal reflux disease)   . Gynecomastia, male   . History of echocardiogram    Echo 12/17: EF 55-60, normal wall motion, grade 1 diastolic dysfunction  . History of nuclear stress test    Myoview 12/17: EF 55, inf-lateral ischemia; intermediate risk  . HLD (hyperlipidemia)    hx of ? elevated LFTs on statin Rx  . Hypertension   . Obesity   . Prostate cancer [facility])    s/p radical prostatectomy  . Spinal stenosis     Tobacco Use: History  Smoking Status  . Never Smoker  Smokeless Tobacco  . Current User  . Types: Chew    Labs: Recent Review Flowsheet Data    Labs for ITP Cardiac and Pulmonary Rehab Latest Ref Rng & Units 12/19/2016 12/19/2016 12/19/2016 12/19/2016 12/20/2016   Hemoglobin A1c 4.8 - 5.6 % - - - - -   PHART 7.350 - 7.450 7.342(L) 7.312(L) - 7.443 -   PCO2ART 32.0 - 48.0 mmHg 40.8 38.4 - 38.6 -   HCO3 20.0 - 28.0 mmol/L 22.6 19.4(L) - 26.2 -   TCO2 0 - 100 mmol/L 24 20 21 27 25    ACIDBASEDEF 0.0 - 2.0 mmol/L 3.0(H) 6.0(H) - - -   O2SAT % 96.0 99.0 -  99.0 -      Capillary Blood Glucose: Lab Results  Component Value Date   GLUCAP 102 (H) 03/04/2017   GLUCAP 137 (H) 02/18/2017   GLUCAP 204 (H) 02/15/2017   GLUCAP 98 02/13/2017   GLUCAP 197 (H) 02/13/2017     Exercise Target Goals:    Exercise Program Goal: Individual exercise prescription set with THRR, safety & activity barriers. Participant demonstrates ability to understand and report RPE using BORG scale, to self-measure pulse accurately, and to acknowledge the importance of the exercise prescription.  Exercise Prescription Goal: Starting with aerobic activity 30 plus minutes a day, 3 days per week for initial exercise prescription. Provide home exercise prescription and guidelines that participant acknowledges understanding prior to discharge.  Activity Barriers & Risk  Stratification:     Activity Barriers & Cardiac Risk Stratification - 02/05/17 1120      Activity Barriers & Cardiac Risk Stratification   Activity Barriers Back Problems   Cardiac Risk Stratification High      6 Minute Walk:     6 Minute Walk    Row Name 02/05/17 1031         6 Minute Walk   Phase Initial     Distance 1400 feet     Walk Time 6 minutes     # of Rest Breaks 0     MPH 2.65     METS 3.07     RPE 10     VO2 Peak 10.74     Symptoms Yes (comment)     Comments Patient c/o dyspnea.     Resting HR 71 bpm     Resting BP 110/57     Max Ex. HR 102 bpm     Max Ex. BP 148/78     2 Minute Post BP 104/60        Oxygen Initial Assessment:   Oxygen Re-Evaluation:   Oxygen Discharge (Final Oxygen Re-Evaluation):   Initial Exercise Prescription:     Initial Exercise Prescription - 02/05/17 1100      Date of Initial Exercise RX and Referring Provider   Date 02/05/17   Referring Provider Candee Furbish MD     Treadmill   MPH 2.3   Grade 1   Minutes 10   METs 3.08     Bike   Level 0.5   Minutes 10   METs 1.99     NuStep   Level 3   SPM 80   Minutes 10   METs 2     Prescription Details   Frequency (times per week) 3   Duration Progress to 30 minutes of continuous aerobic without signs/symptoms of physical distress     Intensity   THRR 40-80% of Max Heartrate 60-120   Ratings of Perceived Exertion 11-13   Perceived Dyspnea 0-4     Progression   Progression Continue to progress workloads to maintain intensity without signs/symptoms of physical distress.     Resistance Training   Training Prescription Yes   Weight 2lbs   Reps 10-15      Perform Capillary Blood Glucose checks as needed.  Exercise Prescription Changes:     Exercise Prescription Changes    Row Name 02/19/17 1400 03/04/17 1600           Response to Exercise   Blood Pressure (Admit) 104/60 128/62      Blood Pressure (Exercise) 140/70 140/74      Blood Pressure  (Exit) 112/60 116/70  Heart Rate (Admit) 75 bpm 82 bpm      Heart Rate (Exercise) 107 bpm 126 bpm      Heart Rate (Exit) 75 bpm 78 bpm      Rating of Perceived Exertion (Exercise) 12 12      Symptoms none none      Duration Progress to 30 minutes of  aerobic without signs/symptoms of physical distress Progress to 30 minutes of  aerobic without signs/symptoms of physical distress      Intensity THRR unchanged THRR unchanged        Progression   Progression Continue to progress workloads to maintain intensity without signs/symptoms of physical distress. Continue to progress workloads to maintain intensity without signs/symptoms of physical distress.      Average METs 2.1 3.3        Resistance Training   Training Prescription Yes Yes      Weight 2lbs 4lbs      Reps 10-15 10-15      Time 10 Minutes 10 Minutes        Treadmill   MPH 2.3 2.6      Grade 1 2      Minutes 10 10      METs 3.08 3.71        Bike   Level 0.7 1.5      Minutes 10 10      METs 2.4 3        NuStep   Level 3 4      SPM 80 80      Minutes 10 10      METs 3.1 3.4        Home Exercise Plan   Plans to continue exercise at  - Home (comment)      Frequency  - Add 2 additional days to program exercise sessions.      Initial Home Exercises Provided  - 02/25/17         Exercise Comments:     Exercise Comments    Row Name 02/28/17 1554           Exercise Comments Reviewed METs and goals. Pt is tolerating exercise well; will continue to monitor exercise progression          Exercise Goals and Review:     Exercise Goals    Row Name 02/05/17 0818             Exercise Goals   Increase Physical Activity Yes       Intervention Provide advice, education, support and counseling about physical activity/exercise needs.;Develop an individualized exercise prescription for aerobic and resistive training based on initial evaluation findings, risk stratification, comorbidities and participant's personal  goals.       Expected Outcomes Achievement of increased cardiorespiratory fitness and enhanced flexibility, muscular endurance and strength shown through measurements of functional capacity and personal statement of participant.       Increase Strength and Stamina Yes       Intervention Provide advice, education, support and counseling about physical activity/exercise needs.;Develop an individualized exercise prescription for aerobic and resistive training based on initial evaluation findings, risk stratification, comorbidities and participant's personal goals.       Expected Outcomes Achievement of increased cardiorespiratory fitness and enhanced flexibility, muscular endurance and strength shown through measurements of functional capacity and personal statement of participant.          Exercise Goals Re-Evaluation :     Exercise Goals Re-Evaluation    [place] Name 02/25/17  3734 02/28/17 1555           Exercise Goal Re-Evaluation   Exercise Goals Review Increase Physical Activity;Increase Strenth and Stamina Increase Strenth and Stamina;Increase Physical Activity      Comments Reviewed home exercise with pt today.  Pt plans to attend [facility] in which he will use recumbent bike and treadmill for exercise 2-3x/week in addition to coming to cardiac rehab. Reviewed THR, pulse, RPE, sign and symptoms, NTG use, and when to call 911 or MD.  Also discussed weather considerations and indoor options.  Pt voiced understanding. 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       Expected Outcomes Pt will be complaint with HEP and improve in cardiorespiratory fitness Pt will continue to improve in cardiorespiratory fitness          Discharge Exercise Prescription (Final Exercise Prescription Changes):     Exercise Prescription Changes - 03/04/17 1600      Response to Exercise   Blood Pressure (Admit) 128/62   Blood Pressure (Exercise) 140/74   Blood Pressure (Exit) 116/70   Heart Rate  (Admit) 82 bpm   Heart Rate (Exercise) 126 bpm   Heart Rate (Exit) 78 bpm   Rating of Perceived Exertion (Exercise) 12   Symptoms none   Duration Progress to 30 minutes of  aerobic without signs/symptoms of physical distress   Intensity THRR unchanged     Progression   Progression Continue to progress workloads to maintain intensity without signs/symptoms of physical distress.   Average METs 3.3     Resistance Training   Training Prescription Yes   Weight 4lbs   Reps 10-15   Time 10 Minutes     Treadmill   MPH 2.6   Grade 2   Minutes 10   METs 3.71     Bike   Level 1.5   Minutes 10   METs 3     NuStep   Level 4   SPM 80   Minutes 10   METs 3.4     Home Exercise Plan   Plans to continue exercise at Home (comment)   Frequency Add 2 additional days to program exercise sessions.   Initial Home Exercises Provided 02/25/17      Nutrition:  Target Goals: Understanding of nutrition guidelines, daily intake of sodium 1500mg , cholesterol 200mg , calories 30% from fat and 7% or less from saturated fats, daily to have 5 or more servings of fruits and vegetables.  Biometrics:     Pre Biometrics - 02/05/17 1034      Pre Biometrics   Height 5\' 9"  (1.753 m)   Weight 207 lb 0.2 oz (93.9 kg)   Waist Circumference 41.5 inches   Hip Circumference 44.5 inches   Waist to Hip Ratio 0.93 %   BMI (Calculated) 30.6   Triceps Skinfold 35 mm   % Body Fat 32.8 %   Grip Strength 45 kg   Flexibility 8 in   Single Leg Stand 9.4 seconds       Nutrition Therapy Plan and Nutrition Goals:     Nutrition Therapy & Goals - 03/01/17 0944      Nutrition Therapy   Diet Carb Modified, Therapeutic Lifestyle Changes     Personal Nutrition Goals   Nutrition Goal Wt loss of 1-2 lb/week to a wt loss goal of 6-24 lb at graduation from Cardiac Rehab   Personal Goal #2 Pt to identify and limit sources of saturated fat, trans fat, and sodium.  Personal Goal #3 Pt to continue to have CBG  concentrations in the normal range or as close to normal as is safely possible.     Intervention Plan   Intervention Prescribe, educate and counsel regarding individualized specific dietary modifications aiming towards targeted core components such as weight, hypertension, lipid management, diabetes, heart failure and other comorbidities.   Expected Outcomes Short Term Goal: Understand basic principles of dietary content, such as calories, fat, sodium, cholesterol and nutrients.;Long Term Goal: Adherence to prescribed nutrition plan.      Nutrition Discharge: Nutrition Scores:     Nutrition Assessments - 03/01/17 0945      MEDFICTS Scores   Pre Score 65      Nutrition Goals Re-Evaluation:   Nutrition Goals Re-Evaluation:   Nutrition Goals Discharge (Final Nutrition Goals Re-Evaluation):   Psychosocial: Target Goals: Acknowledge presence or absence of significant depression and/or stress, maximize coping skills, provide positive support system. Participant is able to verbalize types and ability to use techniques and skills needed for reducing stress and depression.  Initial Review & Psychosocial Screening:     Initial Psych Review & Screening - 02/11/17 1706      Initial Review   Current issues with None Identified     Family Dynamics   Good Support System? Yes   Comments Upon brief assessment, no psychosocial needs identified, no interventions necessary      Barriers   Psychosocial barriers to participate in program There are no identifiable barriers or psychosocial needs.     Screening Interventions   Interventions Encouraged to exercise;Provide feedback about the scores to participant      Quality of Life Scores:     Quality of Life - 02/05/17 1006      Quality of Life Scores   Health/Function Pre 26.93 %   Socioeconomic Pre 28.07 %   Psych/Spiritual Pre 28.18 %   Family Pre 28.5 %   GLOBAL Pre 27.65 %      PHQ-9: Recent Review Flowsheet Data     Depression screen [facility] 2/9 02/11/2017   Decreased Interest 0   Down, Depressed, Hopeless 0   PHQ - 2 Score 0     Interpretation of Total Score  Total Score Depression Severity:  1-4 = Minimal depression, 5-9 = Mild depression, 10-14 = Moderate depression, 15-19 = Moderately severe depression, 20-27 = Severe depression   Psychosocial Evaluation and Intervention:     Psychosocial Evaluation - 03/05/17 1641      Psychosocial Evaluation & Interventions   Interventions Encouraged to exercise with the program and follow exercise prescription   Comments no psychosocial needs identified, no interventions necessary.    Expected Outcomes pt will demonstrate good coping skills with positive hopeful outlook.    Continue Psychosocial Services  No Follow up required      Psychosocial Re-Evaluation:     Psychosocial Re-Evaluation    [place] Name 03/05/17 1641             Psychosocial Re-Evaluation   Current issues with None Identified       Comments no psychosocial needs identifed, no interventions necessary. pt is looking forward to being able to resume usual activities of golf and square dancing.        Expected Outcomes pt will deomonstrate positive outlook with good coping skills.         Interventions Encouraged to attend Cardiac Rehabilitation for the exercise       Continue Psychosocial Services  No Follow up required  Psychosocial Discharge (Final Psychosocial Re-Evaluation):     Psychosocial Re-Evaluation - 03/05/17 1641      Psychosocial Re-Evaluation   Current issues with None Identified   Comments no psychosocial needs identifed, no interventions necessary. pt is looking forward to being able to resume usual activities of golf and square dancing.    Expected Outcomes pt will deomonstrate positive outlook with good coping skills.     Interventions Encouraged to attend Cardiac Rehabilitation for the exercise   Continue Psychosocial Services  No Follow up required       Vocational Rehabilitation: Provide vocational rehab assistance to qualifying candidates.   Vocational Rehab Evaluation & Intervention:     Vocational Rehab - 02/05/17 1219      Initial Vocational Rehab Evaluation & Intervention   Assessment shows need for Vocational Rehabilitation No      Education: Education Goals: Education classes will be provided on a weekly basis, covering required topics. Participant will state understanding/return demonstration of topics presented.  Learning Barriers/Preferences:     Learning Barriers/Preferences - 02/05/17 0818      Learning Barriers/Preferences   Learning Barriers Sight   Learning Preferences Written Material;Skilled Demonstration;Pictoral;Verbal Instruction;Video      Education Topics: Count Your Pulse:  -Group instruction provided by verbal instruction, demonstration, patient participation and written materials to support subject.  Instructors address importance of being able to find your pulse and how to count your pulse when at home without a heart monitor.  Patients get hands on experience counting their pulse with staff help and individually.   Heart Attack, Angina, and Risk Factor Modification:  -Group instruction provided by verbal instruction, video, and written materials to support subject.  Instructors address signs and symptoms of angina and heart attacks.    Also discuss risk factors for heart disease and how to make changes to improve heart health risk factors.   Functional Fitness:  -Group instruction provided by verbal instruction, demonstration, patient participation, and written materials to support subject.  Instructors address safety measures for doing things around the house.  Discuss how to get up and down off the floor, how to pick things up properly, how to safely get out of a chair without assistance, and balance training.   CARDIAC REHAB PHASE II EXERCISE from 03/01/2017 in [place]  Date  02/22/17 [Simultaneous filing. User may not have seen previous data.]  Educator  Cleda Mccreedy, MS RCEP [Simultaneous filing. User may not have seen previous data.]  Instruction Review Code  2- meets goals/outcomes [Simultaneous filing. User may not have seen previous data.]      Meditation and Mindfulness:  -Group instruction provided by verbal instruction, patient participation, and written materials to support subject.  Instructor addresses importance of mindfulness and meditation practice to help reduce stress and improve awareness.  Instructor also leads participants through a meditation exercise.    Stretching for Flexibility and Mobility:  -Group instruction provided by verbal instruction, patient participation, and written materials to support subject.  Instructors lead participants through series of stretches that are designed to increase flexibility thus improving mobility.  These stretches are additional exercise for major muscle groups that are typically performed during regular warm up and cool down.   CARDIAC REHAB PHASE II EXERCISE from 03/01/2017 in [place]  Date  03/01/17  Educator  Cleda Mccreedy, [place] RCEP  Instruction Review Code  2- meets goals/outcomes      Hands Only CPR Anytime:  -Group  instruction provided by verbal instruction, video, patient participation and written materials to support subject.  Instructors co-teach with AHA video for hands only CPR.  Participants get hands on experience with mannequins.   Nutrition I class: Heart Healthy Eating:  -Group instruction provided by PowerPoint slides, verbal discussion, and written materials to support subject matter. The instructor gives an explanation and review of the Therapeutic Lifestyle Changes diet recommendations, which includes a discussion on lipid goals, dietary fat, sodium, fiber, plant stanol/sterol esters, sugar, and the components of a  well-balanced, healthy diet.   CARDIAC REHAB PHASE II EXERCISE from 03/01/2017 in [place]  Date  02/12/17 [Simultaneous filing. User may not have seen previous data.]  Educator  RD [Simultaneous filing. User may not have seen previous data.]  Instruction Review Code  2- meets goals/outcomes [Simultaneous filing. User may not have seen previous data.]      Nutrition II class: Lifestyle Skills:  -Group instruction provided by PowerPoint slides, verbal discussion, and written materials to support subject matter. The instructor gives an explanation and review of label reading, grocery shopping for heart health, heart healthy recipe modifications, and ways to make healthier choices when eating out.   CARDIAC REHAB PHASE II EXERCISE from 03/01/2017 in [place]  Date  02/19/17 [Simultaneous filing. User may not have seen previous data.]  Educator  RD [Simultaneous filing. User may not have seen previous data.]  Instruction Review Code  2- meets goals/outcomes [Simultaneous filing. User may not have seen previous data.]      Diabetes Question & Answer:  -Group instruction provided by PowerPoint slides, verbal discussion, and written materials to support subject matter. The instructor gives an explanation and review of diabetes co-morbidities, pre- and post-prandial blood glucose goals, pre-exercise blood glucose goals, signs, symptoms, and treatment of hypoglycemia and hyperglycemia, and foot care basics.   CARDIAC REHAB PHASE II EXERCISE from 03/01/2017 in [place]  Date  02/15/17 [Simultaneous filing. User may not have seen previous data.]  Educator  RD [Simultaneous filing. User may not have seen previous data.]  Instruction Review Code  2- meets goals/outcomes [Simultaneous filing. User may not have seen previous data.]      Diabetes Blitz:  -Group instruction provided by PowerPoint slides,  verbal discussion, and written materials to support subject matter. The instructor gives an explanation and review of the physiology behind type 1 and type 2 diabetes, diabetes medications and rational behind using different medications, pre- and post-prandial blood glucose recommendations and Hemoglobin A1c goals, diabetes diet, and exercise including blood glucose guidelines for exercising safely.    Portion Distortion:  -Group instruction provided by PowerPoint slides, verbal discussion, written materials, and food models to support subject matter. The instructor gives an explanation of serving size versus portion size, changes in portions sizes over the last 20 years, and what consists of a serving from each food group.   Stress Management:  -Group instruction provided by verbal instruction, video, and written materials to support subject matter.  Instructors review role of stress in heart disease and how to cope with stress positively.     CARDIAC REHAB PHASE II EXERCISE from 03/01/2017 in [place]  Date  02/13/17 [Simultaneous filing. User may not have seen previous data.]  Instruction Review Code  2- meets goals/outcomes [Simultaneous filing. User may not have seen previous data.]      Exercising on Your Own:  -Group instruction  provided by verbal instruction, power point, and written materials to support subject.  Instructors discuss benefits of exercise, components of exercise, frequency and intensity of exercise, and end points for exercise.  Also discuss use of nitroglycerin and activating EMS.  Review options of places to exercise outside of rehab.  Review guidelines for sex with heart disease.   CARDIAC REHAB PHASE II EXERCISE from 03/01/2017 in [place]  Date  02/27/17 [Simultaneous filing. User may not have seen previous data.]  Educator  Cleda Mccreedy, MS RCEP [Simultaneous filing. User may not have seen previous  data.]  Instruction Review Code  2- meets goals/outcomes [Simultaneous filing. User may not have seen previous data.]      Cardiac Drugs I:  -Group instruction provided by verbal instruction and written materials to support subject.  Instructor reviews cardiac drug classes: antiplatelets, anticoagulants, beta blockers, and statins.  Instructor discusses reasons, side effects, and lifestyle considerations for each drug class.   Cardiac Drugs II:  -Group instruction provided by verbal instruction and written materials to support subject.  Instructor reviews cardiac drug classes: angiotensin converting enzyme inhibitors (ACE-I), angiotensin II receptor blockers (ARBs), nitrates, and calcium channel blockers.  Instructor discusses reasons, side effects, and lifestyle considerations for each drug class.   CARDIAC REHAB PHASE II EXERCISE from 03/01/2017 in [place]  Date  02/20/17 [Simultaneous filing. User may not have seen previous data.]  Educator  jackie [Simultaneous filing. User may not have seen previous data.]  Instruction Review Code  2- meets goals/outcomes [Simultaneous filing. User may not have seen previous data.]      Anatomy and Physiology of the Circulatory System:  -Group instruction provided by verbal instruction, video, and written materials to support subject.  Reviews functional anatomy of heart, how it relates to various diagnoses, and what role the heart plays in the overall system.   Knowledge Questionnaire Score:     Knowledge Questionnaire Score - 02/05/17 1006      Knowledge Questionnaire Score   Pre Score 22/24      Core Components/Risk Factors/Patient Goals at Admission:     Personal Goals and Risk Factors at Admission - 03/01/17 0945      Core Components/Risk Factors/Patient Goals on Admission    Weight Management Yes;Obesity;Weight Loss   Intervention Weight Management: Develop a combined nutrition and exercise program  designed to reach desired caloric intake, while maintaining appropriate intake of nutrient and fiber, sodium and fats, and appropriate energy expenditure required for the weight goal.;Weight Management: Provide education and appropriate resources to help participant work on and attain dietary goals.;Weight Management/Obesity: Establish reasonable short term and long term weight goals.;Obesity: Provide education and appropriate resources to help participant work on and attain dietary goals.   Expected Outcomes Short Term: Continue to assess and modify interventions until short term weight is achieved;Long Term: Adherence to nutrition and physical activity/exercise program aimed toward attainment of established weight goal;Weight Maintenance: Understanding of the daily nutrition guidelines, which includes 25-35% calories from fat, 7% or less cal from saturated fats, less than 200mg  cholesterol, less than 1.5gm of sodium, & 5 or more servings of fruits and vegetables daily;Weight Loss: Understanding of general recommendations for a balanced deficit meal plan, which promotes 1-2 lb weight loss per week and includes a negative energy balance of [phone] kcal/d;Understanding recommendations for meals to include 15-35% energy as protein, 25-35% energy from fat, 35-60% energy from carbohydrates, less than 200mg  of dietary cholesterol, 20-35 gm  of total fiber daily;Understanding of distribution of calorie intake throughout the day with the consumption of 4-5 meals/snacks   Diabetes Yes   Intervention Provide education about signs/symptoms and action to take for hypo/hyperglycemia.;Provide education about proper nutrition, including hydration, and aerobic/resistive exercise prescription along with prescribed medications to achieve blood glucose in normal ranges: Fasting glucose 65-99 mg/dL   Expected Outcomes Long Term: Attainment of HbA1C < 7%.;Short Term: Participant verbalizes understanding of the signs/symptoms and  immediate care of hyper/hypoglycemia, proper foot care and importance of medication, aerobic/resistive exercise and nutrition plan for blood glucose control.   Hypertension Yes   Intervention Provide education on lifestyle modifcations including regular physical activity/exercise, weight management, moderate sodium restriction and increased consumption of fresh fruit, vegetables, and low fat dairy, alcohol moderation, and smoking cessation.;Monitor prescription use compliance.   Expected Outcomes Short Term: Continued assessment and intervention until BP is < 140/5mm HG in hypertensive participants. < 130/50mm HG in hypertensive participants with diabetes, heart failure or chronic kidney disease.;Long Term: Maintenance of blood pressure at goal levels.   Lipids Yes   Intervention Provide education and support for participant on nutrition & aerobic/resistive exercise along with prescribed medications to achieve LDL 70mg , HDL >40mg .   Expected Outcomes Short Term: Participant states understanding of desired cholesterol values and is compliant with medications prescribed. Participant is following exercise prescription and nutrition guidelines.;Long Term: Cholesterol controlled with medications as prescribed, with individualized exercise RX and with personalized nutrition plan. Value goals: LDL < 70mg , HDL > 40 mg.   Personal Goal Other Yes   Personal Goal Be able to get back to playing golf without being limited by back pain, Long term: lose weight, goal at 185-190lbs   Intervention Provide exercise programming and education on nutrition couseling to assist with weightloss and improving cardiorespiratory fitness.   Expected Outcomes Pt will be able to return to hobbies of bowling, playing golf and square dancing without being limited by back pain and endurance.      Core Components/Risk Factors/Patient Goals Review:      Goals and Risk Factor Review    Row Name 02/27/17 1222 03/05/17 1641            Core Components/Risk Factors/Patient Goals Review   Personal Goals Review Weight Management/Obesity;Diabetes;Hypertension;Lipids;Other Weight Management/Obesity;Diabetes;Hypertension;Lipids;Other      Review Pt progressing as expected with excellent participation in CR.   pt exhibits appropriate DM self management.  pt reports activities are getting easier for him to perform without symptoms.  pt is using stationary bike 8minutes at home without symptoms on non CR days.  Will continue to monitor and intervene as necessary.  Pt progressing as expected with excellent participation in CR.   pt exhibits appropriate DM self management.  pt reports activities are getting easier for him to perform without symptoms.  pt is using stationary bike 31minutes at home without symptoms on non CR days.  Will continue to monitor and intervene as necessary.       Expected Outcomes pt will continue CR attendance focusing on exercise, education and nutrition to reduce RF including DM, HTN, lipds and obesity.   pt will continue CR attendance focusing on exercise, education and nutrition to reduce RF including DM, HTN, lipds and obesity.           Core Components/Risk Factors/Patient Goals at Discharge (Final Review):      Goals and Risk Factor Review - 03/05/17 1641      Core Components/Risk Factors/Patient Goals Review  Personal Goals Review Weight Management/Obesity;Diabetes;Hypertension;Lipids;Other   Review Pt progressing as expected with excellent participation in CR.   pt exhibits appropriate DM self management.  pt reports activities are getting easier for him to perform without symptoms.  pt is using stationary bike 3minutes at home without symptoms on non CR days.  Will continue to monitor and intervene as necessary.    Expected Outcomes pt will continue CR attendance focusing on exercise, education and nutrition to reduce RF including DM, HTN, lipds and obesity.        ITP Comments:     ITP  Comments    Row Name 02/05/17 0815           ITP Comments Dr. Fransico Him, Medical Director          Comments: Pt is making expected progress toward personal goals after completing 11 sessions. Recommend continued exercise and life style modification education including  stress management and relaxation techniques to decrease cardiac risk profile.

## 2017-03-06 ENCOUNTER — Encounter (HOSPITAL_COMMUNITY)
Admission: RE | Admit: 2017-03-06 | Discharge: 2017-03-06 | Disposition: A | Discharge status: Home / Self Care | Payer: Medicare Other | Source: Ambulatory Visit | Attending: Cardiology | Admitting: Cardiology

## 2017-03-06 DIAGNOSIS — Z951 Presence of aortocoronary bypass graft: Secondary | ICD-10-CM

## 2017-03-06 DIAGNOSIS — E119 Type 2 diabetes mellitus without complications: Secondary | ICD-10-CM | POA: Diagnosis not present

## 2017-03-06 DIAGNOSIS — I2510 Atherosclerotic heart disease of native coronary artery without angina pectoris: Secondary | ICD-10-CM | POA: Diagnosis not present

## 2017-03-06 DIAGNOSIS — Z7982 Long term (current) use of aspirin: Secondary | ICD-10-CM | POA: Diagnosis not present

## 2017-03-06 DIAGNOSIS — K219 Gastro-esophageal reflux disease without esophagitis: Secondary | ICD-10-CM | POA: Diagnosis not present

## 2017-03-06 DIAGNOSIS — M160 Bilateral primary osteoarthritis of hip: Secondary | ICD-10-CM | POA: Diagnosis not present

## 2017-03-08 ENCOUNTER — Encounter (HOSPITAL_COMMUNITY)
Admission: RE | Admit: 2017-03-08 | Discharge: 2017-03-08 | Disposition: A | Discharge status: Home / Self Care | Payer: Medicare Other | Source: Ambulatory Visit | Attending: Cardiology | Admitting: Cardiology

## 2017-03-08 DIAGNOSIS — E119 Type 2 diabetes mellitus without complications: Secondary | ICD-10-CM | POA: Diagnosis not present

## 2017-03-08 DIAGNOSIS — Z951 Presence of aortocoronary bypass graft: Secondary | ICD-10-CM | POA: Diagnosis not present

## 2017-03-08 DIAGNOSIS — K219 Gastro-esophageal reflux disease without esophagitis: Secondary | ICD-10-CM | POA: Diagnosis not present

## 2017-03-08 DIAGNOSIS — Z7982 Long term (current) use of aspirin: Secondary | ICD-10-CM | POA: Diagnosis not present

## 2017-03-08 DIAGNOSIS — I2510 Atherosclerotic heart disease of native coronary artery without angina pectoris: Secondary | ICD-10-CM | POA: Diagnosis not present

## 2017-03-08 DIAGNOSIS — M160 Bilateral primary osteoarthritis of hip: Secondary | ICD-10-CM | POA: Diagnosis not present

## 2017-03-11 ENCOUNTER — Encounter (HOSPITAL_COMMUNITY)
Admission: RE | Admit: 2017-03-11 | Discharge: 2017-03-11 | Disposition: A | Discharge status: Home / Self Care | Payer: Medicare Other | Source: Ambulatory Visit | Attending: Cardiology | Admitting: Cardiology

## 2017-03-11 DIAGNOSIS — K219 Gastro-esophageal reflux disease without esophagitis: Secondary | ICD-10-CM | POA: Insufficient documentation

## 2017-03-11 DIAGNOSIS — Z8546 Personal history of malignant neoplasm of prostate: Secondary | ICD-10-CM | POA: Insufficient documentation

## 2017-03-11 DIAGNOSIS — Z7982 Long term (current) use of aspirin: Secondary | ICD-10-CM | POA: Insufficient documentation

## 2017-03-11 DIAGNOSIS — I2510 Atherosclerotic heart disease of native coronary artery without angina pectoris: Secondary | ICD-10-CM | POA: Insufficient documentation

## 2017-03-11 DIAGNOSIS — M160 Bilateral primary osteoarthritis of hip: Secondary | ICD-10-CM | POA: Diagnosis not present

## 2017-03-11 DIAGNOSIS — E669 Obesity, unspecified: Secondary | ICD-10-CM | POA: Diagnosis not present

## 2017-03-11 DIAGNOSIS — Z951 Presence of aortocoronary bypass graft: Secondary | ICD-10-CM | POA: Diagnosis not present

## 2017-03-11 DIAGNOSIS — N62 Hypertrophy of breast: Secondary | ICD-10-CM | POA: Diagnosis not present

## 2017-03-11 DIAGNOSIS — N529 Male erectile dysfunction, unspecified: Secondary | ICD-10-CM | POA: Diagnosis not present

## 2017-03-11 DIAGNOSIS — I10 Essential (primary) hypertension: Secondary | ICD-10-CM | POA: Diagnosis not present

## 2017-03-11 DIAGNOSIS — E785 Hyperlipidemia, unspecified: Secondary | ICD-10-CM | POA: Diagnosis not present

## 2017-03-11 DIAGNOSIS — E119 Type 2 diabetes mellitus without complications: Secondary | ICD-10-CM | POA: Diagnosis not present

## 2017-03-13 ENCOUNTER — Encounter (HOSPITAL_COMMUNITY)
Admission: RE | Admit: 2017-03-13 | Discharge: 2017-03-13 | Disposition: A | Discharge status: Home / Self Care | Payer: Medicare Other | Source: Ambulatory Visit | Attending: Cardiology | Admitting: Cardiology

## 2017-03-13 ENCOUNTER — Telehealth (HOSPITAL_COMMUNITY): Payer: Self-pay | Admitting: Cardiac Rehabilitation

## 2017-03-13 DIAGNOSIS — Z7982 Long term (current) use of aspirin: Secondary | ICD-10-CM | POA: Diagnosis not present

## 2017-03-13 DIAGNOSIS — K219 Gastro-esophageal reflux disease without esophagitis: Secondary | ICD-10-CM | POA: Diagnosis not present

## 2017-03-13 DIAGNOSIS — Z951 Presence of aortocoronary bypass graft: Secondary | ICD-10-CM

## 2017-03-13 DIAGNOSIS — E119 Type 2 diabetes mellitus without complications: Secondary | ICD-10-CM | POA: Diagnosis not present

## 2017-03-13 DIAGNOSIS — M160 Bilateral primary osteoarthritis of hip: Secondary | ICD-10-CM | POA: Diagnosis not present

## 2017-03-13 DIAGNOSIS — I2510 Atherosclerotic heart disease of native coronary artery without angina pectoris: Secondary | ICD-10-CM | POA: Diagnosis not present

## 2017-03-13 LAB — GLUCOSE, CAPILLARY: Glucose-Capillary: 116 mg/dL — ABNORMAL HIGH (ref 65–99)

## 2017-03-13 NOTE — Progress Notes (Signed)
Daily Session Note  Patient Details  Name: Andrew Williamson MRN: [id number] Date of Birth: January 11, 1946 Referring Provider:     CARDIAC REHAB PHASE II ORIENTATION from 02/05/2017 in [place]  Referring Provider  Candee Furbish MD      Encounter Date: 03/13/2017  Check In:     Session Check In - 03/13/17 0906      Check-In   Location [facility]   Staff Present Cleda Mccreedy, MS, Exercise Physiologist;Amber Fair, MS, ACSM RCEP, Exercise Physiologist;Masaki Rothbauer, RN, Marga Melnick, RN, BSN   Supervising physician immediately available to respond to emergencies [facility] immediately available   Physician(s) Dr. Ree Kida   Medication changes reported     No   Fall or balance concerns reported    No   Tobacco Cessation No Change   Warm-up and Cool-down Performed as group-led instruction   Resistance Training Performed No   VAD Patient? No     Pain Assessment   Currently in Pain? No/denies   Multiple Pain Sites No      Capillary Blood Glucose: No results found for this or any previous visit (from the past 24 hour(s)).    History  Smoking Status  . Never Smoker  Smokeless Tobacco  . Current User  . Types: Chew    Goals Met:  Exercise tolerated well:  Pt asymptomatic.     Goals Unmet:  N/A  Comments: Pt has reported occasional chest wall discomfort.  Pt describes as pulling sensation.  Occurs mostly at rest at home and on airdyne at cardiac rehab.  Pt denies discomfort today.  Pt reassured symptoms appear appropriate for post op healing process, however will continue to monitor.  If symptoms persist or worsen will schedule office evaluation.  Dr. Marlou Porch made aware.  Pt verbalized understanding.   Dr. Fransico Him is Medical Director for Cardiac Rehab at [facility].

## 2017-03-13 NOTE — Telephone Encounter (Signed)
-----   Message from Jerline Pain, MD sent at 03/13/2017  1:22 PM EDT ----- Regarding: RE: cardiac rehab  Agree, likely musculoskeletal. Continue to monitor. ----- Message ----- From: Lowell Guitar, RN Sent: 03/13/2017  10:41 AM To: Jerline Pain, MD Subject: cardiac rehab                                  Dear Dr. Marlou Porch,  Pt reports at cardiac rehab he occasionally has chest wall discomfort.  He notices it at rehab when using the airdyne.  He notices it at home with at rest.  He describes it as a "pulling sensation". Pt does admit it occurs more with using arms.   He did not have any problems at cardiac rehab today.  I have reassured him it sounds musculoskeletal and typical for sternal healing process. We will continue to monitor, if is persists or worsens we will arrange for office followup.    Thank you, Andi Hence, RN, BSN Cardiac Pulmonary Rehab

## 2017-03-15 ENCOUNTER — Encounter (HOSPITAL_COMMUNITY)
Admission: RE | Admit: 2017-03-15 | Discharge: 2017-03-15 | Disposition: A | Discharge status: Home / Self Care | Payer: Medicare Other | Source: Ambulatory Visit | Attending: Cardiology | Admitting: Cardiology

## 2017-03-15 DIAGNOSIS — E119 Type 2 diabetes mellitus without complications: Secondary | ICD-10-CM | POA: Diagnosis not present

## 2017-03-15 DIAGNOSIS — Z951 Presence of aortocoronary bypass graft: Secondary | ICD-10-CM

## 2017-03-15 DIAGNOSIS — I2510 Atherosclerotic heart disease of native coronary artery without angina pectoris: Secondary | ICD-10-CM | POA: Diagnosis not present

## 2017-03-15 DIAGNOSIS — K219 Gastro-esophageal reflux disease without esophagitis: Secondary | ICD-10-CM | POA: Diagnosis not present

## 2017-03-15 DIAGNOSIS — Z7982 Long term (current) use of aspirin: Secondary | ICD-10-CM | POA: Diagnosis not present

## 2017-03-15 DIAGNOSIS — M160 Bilateral primary osteoarthritis of hip: Secondary | ICD-10-CM | POA: Diagnosis not present

## 2017-03-18 ENCOUNTER — Encounter (HOSPITAL_COMMUNITY)
Admission: RE | Admit: 2017-03-18 | Discharge: 2017-03-18 | Disposition: A | Discharge status: Home / Self Care | Payer: Medicare Other | Source: Ambulatory Visit | Attending: Cardiology | Admitting: Cardiology

## 2017-03-18 DIAGNOSIS — K219 Gastro-esophageal reflux disease without esophagitis: Secondary | ICD-10-CM | POA: Diagnosis not present

## 2017-03-18 DIAGNOSIS — E119 Type 2 diabetes mellitus without complications: Secondary | ICD-10-CM | POA: Diagnosis not present

## 2017-03-18 DIAGNOSIS — Z951 Presence of aortocoronary bypass graft: Secondary | ICD-10-CM

## 2017-03-18 DIAGNOSIS — Z7982 Long term (current) use of aspirin: Secondary | ICD-10-CM | POA: Diagnosis not present

## 2017-03-18 DIAGNOSIS — M160 Bilateral primary osteoarthritis of hip: Secondary | ICD-10-CM | POA: Diagnosis not present

## 2017-03-18 DIAGNOSIS — I2510 Atherosclerotic heart disease of native coronary artery without angina pectoris: Secondary | ICD-10-CM | POA: Diagnosis not present

## 2017-03-20 ENCOUNTER — Encounter (HOSPITAL_COMMUNITY)
Admission: RE | Admit: 2017-03-20 | Discharge: 2017-03-20 | Disposition: A | Discharge status: Home / Self Care | Payer: Medicare Other | Source: Ambulatory Visit | Attending: Cardiology | Admitting: Cardiology

## 2017-03-20 ENCOUNTER — Encounter: Payer: Self-pay | Admitting: Physician Assistant

## 2017-03-20 DIAGNOSIS — K219 Gastro-esophageal reflux disease without esophagitis: Secondary | ICD-10-CM | POA: Diagnosis not present

## 2017-03-20 DIAGNOSIS — E119 Type 2 diabetes mellitus without complications: Secondary | ICD-10-CM | POA: Diagnosis not present

## 2017-03-20 DIAGNOSIS — Z951 Presence of aortocoronary bypass graft: Secondary | ICD-10-CM

## 2017-03-20 DIAGNOSIS — M160 Bilateral primary osteoarthritis of hip: Secondary | ICD-10-CM | POA: Diagnosis not present

## 2017-03-20 DIAGNOSIS — I2510 Atherosclerotic heart disease of native coronary artery without angina pectoris: Secondary | ICD-10-CM | POA: Diagnosis not present

## 2017-03-20 DIAGNOSIS — Z7982 Long term (current) use of aspirin: Secondary | ICD-10-CM | POA: Diagnosis not present

## 2017-03-22 ENCOUNTER — Encounter (HOSPITAL_COMMUNITY)
Admission: RE | Admit: 2017-03-22 | Discharge: 2017-03-22 | Disposition: A | Discharge status: Home / Self Care | Payer: Medicare Other | Source: Ambulatory Visit | Attending: Cardiology | Admitting: Cardiology

## 2017-03-22 DIAGNOSIS — E119 Type 2 diabetes mellitus without complications: Secondary | ICD-10-CM | POA: Diagnosis not present

## 2017-03-22 DIAGNOSIS — Z951 Presence of aortocoronary bypass graft: Secondary | ICD-10-CM | POA: Diagnosis not present

## 2017-03-22 DIAGNOSIS — M160 Bilateral primary osteoarthritis of hip: Secondary | ICD-10-CM | POA: Diagnosis not present

## 2017-03-22 DIAGNOSIS — K219 Gastro-esophageal reflux disease without esophagitis: Secondary | ICD-10-CM | POA: Diagnosis not present

## 2017-03-22 DIAGNOSIS — I2510 Atherosclerotic heart disease of native coronary artery without angina pectoris: Secondary | ICD-10-CM | POA: Diagnosis not present

## 2017-03-22 DIAGNOSIS — Z7982 Long term (current) use of aspirin: Secondary | ICD-10-CM | POA: Diagnosis not present

## 2017-03-25 ENCOUNTER — Telehealth (HOSPITAL_COMMUNITY): Payer: Self-pay | Admitting: *Deleted

## 2017-03-25 ENCOUNTER — Telehealth: Payer: Self-pay | Admitting: Cardiology

## 2017-03-25 ENCOUNTER — Encounter: Payer: Self-pay | Admitting: Cardiology

## 2017-03-25 ENCOUNTER — Encounter (HOSPITAL_COMMUNITY)
Admission: RE | Admit: 2017-03-25 | Discharge: 2017-03-25 | Disposition: A | Discharge status: Home / Self Care | Payer: Medicare Other | Source: Ambulatory Visit | Attending: Cardiology | Admitting: Cardiology

## 2017-03-25 ENCOUNTER — Ambulatory Visit (INDEPENDENT_AMBULATORY_CARE_PROVIDER_SITE_OTHER): Payer: Medicare Other | Admitting: Cardiology

## 2017-03-25 VITALS — BP 118/70 | HR 81 | Ht 69.0 in | Wt 209.4 lb

## 2017-03-25 DIAGNOSIS — E782 Mixed hyperlipidemia: Secondary | ICD-10-CM | POA: Diagnosis not present

## 2017-03-25 DIAGNOSIS — I2510 Atherosclerotic heart disease of native coronary artery without angina pectoris: Secondary | ICD-10-CM | POA: Diagnosis not present

## 2017-03-25 DIAGNOSIS — I10 Essential (primary) hypertension: Secondary | ICD-10-CM | POA: Diagnosis not present

## 2017-03-25 DIAGNOSIS — Z951 Presence of aortocoronary bypass graft: Secondary | ICD-10-CM

## 2017-03-25 DIAGNOSIS — M160 Bilateral primary osteoarthritis of hip: Secondary | ICD-10-CM | POA: Diagnosis not present

## 2017-03-25 DIAGNOSIS — E119 Type 2 diabetes mellitus without complications: Secondary | ICD-10-CM | POA: Diagnosis not present

## 2017-03-25 DIAGNOSIS — Z7982 Long term (current) use of aspirin: Secondary | ICD-10-CM | POA: Diagnosis not present

## 2017-03-25 DIAGNOSIS — R079 Chest pain, unspecified: Secondary | ICD-10-CM

## 2017-03-25 DIAGNOSIS — K219 Gastro-esophageal reflux disease without esophagitis: Secondary | ICD-10-CM | POA: Diagnosis not present

## 2017-03-25 NOTE — Progress Notes (Signed)
Cardiology Office Note   Date:  03/25/2017   ID:  Andrew Williamson, DOB 1946-05-08, MRN [id number]  PCP:  Lujean Amel, MD  Cardiologist:  Dr. Marlou Porch    Chief Complaint  Patient presents with  . Chest Pain      History of Present Illness: Andrew Williamson is a 71 y.o. male who presents for increasing episodes of chest pain with exertion and at rest.  He has recent hx of CABG 12/19/16 with LIMA to LAD, VG to Ramus, and VG to circ.  He did well post op.  Recently at cardiac rehab he develops a burning in his chest with stationary bike.  It does radiate to his throat and is similar to his reflux though not as severe and his is on PPI.  Pt does not remember having chest pain prior to CABG but actually chest pain is what led to stress test and cath and ultimately CABG.  His pain begins on bike and continues through Treadmill, then eases off with  Stepper.  He also has the same burning pain at home in recliner.  No associated symptoms with the pain.    It has not awakened from sleep.    Other hx as below.   Past Medical History:  Diagnosis Date  . Allergic rhinitis   . Coronary artery disease involving native coronary artery of native heart without angina pectoris 01/07/2017   a - Myoview 12/17:  EF 55, inf-lateral ischemia; intermediate risk // b - LHC 12/05/16: oLM 50, oLAD 40, dLAD 50, RI 25, oLCx 95, mLCx 95, RCA luminal irregs // c - Echo 12/17: EF 55-60, no RWMA, Gr 1 DD // d. 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 DJD (degenerative joint disease)    hips  . ED (erectile dysfunction)   . GERD (gastroesophageal reflux disease)   . Gynecomastia, male   . History of echocardiogram    Echo 12/17: EF 55-60, normal wall motion, grade 1 diastolic dysfunction  . History of nuclear stress test    Myoview 12/17: EF 55, inf-lateral ischemia; intermediate risk  . HLD (hyperlipidemia)    hx of ? elevated LFTs on statin  Rx  . Hypertension   . Obesity   . Prostate cancer [facility])    s/p radical prostatectomy  . Spinal stenosis     Past Surgical History:  Procedure Laterality Date  . CARDIAC CATHETERIZATION N/A 12/05/2016   Procedure: Left Heart Cath and Coronary Angiography;  Surgeon: Jettie Booze, MD;  Location: [place] CV [facility];  Service: Cardiovascular;  Laterality: N/A;  . CARDIAC CATHETERIZATION N/A 12/05/2016   Procedure: Intravascular Ultrasound/IVUS;  Surgeon: Jettie Booze, MD;  Location: [place] [facility];  Service: Cardiovascular;  Laterality: N/A;  . COLONOSCOPY  06/2014   FU 10 years  . CORONARY ARTERY BYPASS GRAFT N/A 12/19/2016   Procedure: CORONARY ARTERY BYPASS GRAFTING (CABG) x three , using left internal mammary artery and right leg greater saphenous vein harvested endoscopically; SVG to Ramus, SVG to circ, LIMA to LAD ;  Surgeon: [name], MD;  Location: [place];  Service: Open Heart Surgery;  Laterality: N/A;  . LAPAROSCOPIC CHOLECYSTECTOMY     2012  . ROBOT ASSISTED LAPAROSCOPIC RADICAL PROSTATECTOMY N/A 08/11/2015   Procedure: ROBOTIC ASSISTED LAPAROSCOPIC RADICAL PROSTATECTOMY;  Surgeon: Irine Seal, MD;  Location: [facility];  Service: Urology;  Laterality: N/A;  . TEE WITHOUT CARDIOVERSION N/A  12/19/2016   Procedure: TRANSESOPHAGEAL ECHOCARDIOGRAM (TEE);  Surgeon: [name], MD;  Location: [place];  Service: Open Heart Surgery;  Laterality: N/A;     Current Outpatient Prescriptions  Medication Sig Dispense Refill  . aspirin EC 325 MG tablet Take 1 tablet (325 mg total) by mouth daily.    Marland Kitchen gabapentin (NEURONTIN) 300 MG capsule Take 300 mg by mouth 3 (three) times daily.     Marland Kitchen lisinopril (PRINIVIL,ZESTRIL) 5 MG tablet Take 1 tablet (5 mg total) by mouth daily. 30 tablet 1  . metFORMIN (GLUCOPHAGE) 1000 MG tablet Take 1,000 mg by mouth 2 (two) times daily with a meal.    . metoprolol tartrate (LOPRESSOR) 25 MG tablet Take 1 tablet (25 mg total) by mouth every  8 (eight) hours. 270 tablet 3  . Multiple Vitamins-Minerals (CENTRUM SILVER PO) Take 1 tablet by mouth daily.    Marland Kitchen omeprazole (PRILOSEC) 40 MG capsule TAKE ONE CAPSULE EVERY DAY ONCE A DAY ORALLY 30 DAYS  3  . rosuvastatin (CRESTOR) 20 MG tablet Take 20 mg by mouth daily. 1/2 tab daily     No current facility-administered medications for this visit.     Allergies:   Thimerosal    Social History:  The patient  reports that he has never smoked. His smokeless tobacco use includes Chew. He reports that he drinks alcohol. He reports that he does not use drugs.   Family History:  The patient's family history includes Heart attack in his maternal uncle; Heart attack (age of onset: 29) in his mother; Heart failure (age of onset: 33) in his father; Peripheral Artery Disease in his father.    ROS:  General:no colds or fevers, no weight changes Skin:no rashes or ulcers HEENT:no blurred vision, no congestion CV:see HPI PUL:see HPI GI:no diarrhea constipation or melena, no indigestion GU:no hematuria, no dysuria MS:no joint pain, no claudication Neuro:no syncope, no lightheadedness Endo:+ diabetes has been stable. no thyroid disease  Wt Readings from Last 3 Encounters:  03/25/17 209 lb 6.6 oz (95 kg)  02/11/17 208 lb 5.4 oz (94.5 kg)  02/05/17 207 lb 0.2 oz (93.9 kg)     PHYSICAL EXAM: VS:  BP 118/70 (BP Location: Left Arm, Patient Position: Sitting, Cuff Size: Normal)   Pulse 81   Ht 5\' 9"  (1.753 m)   Wt 209 lb 6.6 oz (95 kg)   BMI 30.92 kg/m  , BMI Body mass index is 30.92 kg/m. General:Pleasant affect, NAD Skin:Warm and dry, brisk capillary refill HEENT:normocephalic, sclera clear, mucus membranes moist Neck:supple, no JVD, no bruits  Heart:S1S2 RRR without murmur, gallup, rub or click Lungs:clear without rales, rhonchi, or wheezes ION:GEXB, non tender, + BS, do not palpate liver spleen or masses Ext:no lower ext edema, 2+ pedal pulses, 2+ radial pulses Neuro:alert and oriented  X 3, MAE, follows commands, + facial symmetry    EKG:  EKG is ordered today. The ekg ordered today demonstrates SR with Sinus arrthymia and PVC.  With monitoring with cardiac rehab he has occ PVC.    Recent Labs: 12/17/2016: ALT 19 12/20/2016: Magnesium 2.3 12/22/2016: Hemoglobin 12.6; Platelets 151 12/24/2016: BUN 21; Creatinine, Ser 1.09; Potassium 3.8; Sodium 139    Lipid Panel No results found for: CHOL, TRIG, HDL, CHOLHDL, VLDL, LDLCALC, LDLDIRECT     Other studies Reviewed: Additional studies/ records that were reviewed today include:   Procedures   Intravascular Ultrasound/IVUS  Left Heart Cath and Coronary Angiography  Conclusion     The left ventricular  systolic function is normal.  LV end diastolic pressure is normal.  The left ventricular ejection fraction is 55-65% by visual estimate.  There is no aortic valve stenosis.  Ost LM to LM lesion, 50 %stenosed. Significant by IVUS which showed ostial Left main to have a luminal area of 4.5 mm2. Significant catheter dampening with 6 Fr Guide.  Proximal Cx lesion, 95 %stenosed just after ramus takeoff.  Ost LAD to Prox LAD lesion, 40 %stenosed. Moderate disease in ostial ramus.  Mid Cx lesion, 95 %stenosed.   Given multivessel CAD including bifurcation disease at the left main, will plan for cardiac surgery consultation.  He does not have angina with regular household activities.  He had minimal angina with the stress test, now on Imdur.  He lost his SL NTG so I called in another prescription.  Plan for outpatient surgical consultation.      TEE in OR  Result status: Final result   Left ventricle: Normal cavity size, wall thickness, left ventricular diastolic function and left atrial pressure. LV systolic function is normal with an EF of 55-60%. There are no obvious wall motion abnormalities. No thrombus present. No mass present.  Aortic valve: No AV vegetation.  Right ventricle: Normal cavity size, wall  thickness and ejection fraction.      ASSESSMENT AND PLAN:  1. Chest pain, some typical and some atypical, due to ongining episodes will do an exercise myoview to make sure grafts are patent.  He can continue cardiac rehab but slower pace. Follow up with [name] as instructed or if problems with nuc will have him seen earlier and discuss with Dr. Marlou Porch.  2.  CAD with recent CABG.  Has recovered.   3. Hx of GERD his symptoms are somewhat similar to his GERD so will increase PPI to BID for a week to check for improvement.    4. Diabetes per PCP  5. HLD per PCp.   Current medicines are reviewed with the patient today.  The patient Has no concerns regarding medicines.  The following changes have been made:  See above Labs/ tests ordered today include:see above  Disposition:   FU:  see above  Signed, Cecilie Kicks, NP  03/25/2017 1:11 PM    [facility], [address] [place], [place] Phone: [phone]; Fax: [phone]

## 2017-03-25 NOTE — Telephone Encounter (Signed)
New message      Pt c/o of Chest Pain: 1. Are you having CP right now? no 2. Are you experiencing any other symptoms (ex. SOB, nausea, vomiting, sweating)?  Little sob and throat discomfort today at cardiac rehab 3. How long have you been experiencing CP? For a few weeks 4. Is your CP continuous or coming and going? Comes and goes 5. Have you taken Nitroglycerin?

## 2017-03-25 NOTE — Progress Notes (Signed)
Pt c/o chest tightness and throat tightness on exertion at cardiac rehab.  Pt reports symptoms began on airdyne bicycle. Pt reports he thinks the cold air from the fan contributed to this symptom.  However when pt first reported symptoms to staff he was on treadmill 2.6/2.0 and reports the symptoms decreased with lowered incline. Pt denies symptoms on nustep.  Pt also reports he notices the symptoms at home with exertion and at rest.  Pt first reported chest tightness  With exertion on 03/13/2017.  At that, reported symptoms sounded musculoskeletal.  Dr. Marlou Porch was notified, no new orders received.   Since symptoms have been persistent, office visit has been scheduled to evaluate.  Pt scheduled to see Cecilie Kicks, NP today @ 10:30.  Pr verbalized understanding.

## 2017-03-25 NOTE — Telephone Encounter (Signed)
Left message on voicemail in reference to upcoming appointment scheduled for 03/28/17. Phone number given for a call back so details instructions can be given. Andrew Williamson

## 2017-03-25 NOTE — Patient Instructions (Signed)
Medication Instructions:  Your physician has recommended you make the following change in your medication:  1. Increase Omeprazole (40 mg ) twice a day for one week than go back to (40 mg ) daily   Labwork: -None   Testing/Procedures: Your physician has requested that you have en exercise stress myoview. For further information please visit [email or web address]. Please follow instruction sheet, as given.    Follow-Up:  Your physician recommends that you keep your schedule follow-up appointment in May with [name], [place].   Any Other Special Instructions Will Be Listed Below (If Applicable).     If you need a refill on your cardiac medications before your next appointment, please call your pharmacy.

## 2017-03-25 NOTE — Telephone Encounter (Signed)
Spoke with joann at cardiac rehab, she reports for a few weeks now the patient has had some chest discomfort with exertion. That was felt to be muscle pain and joann reports she talked to dr Marlou Porch about the patient. This morning the patient had chest discomfort while riding the bike but did not tell anyone. Then when he got to the treadmill he reported the discomfort, his work load was decreased and the discomfort got better and went completely away when he stopped. He also reported to them rest and exertional pain at home. They are asking for the patient to be seen today. Patient will see laura ingold this morning.

## 2017-03-25 NOTE — Telephone Encounter (Signed)
Patient given detailed instructions per Myocardial Perfusion Study Information Sheet for the test on 03/27/17 at 0730. Patient notified to arrive 15 minutes early and that it is imperative to arrive on time for appointment to keep from having the test rescheduled.  If you need to cancel or reschedule your appointment, please call the office within 24 hours of your appointment. Failure to do so may result in a cancellation of your appointment, and a $50 no show fee. Patient verbalized understanding.Haille Pardi, [place]

## 2017-03-27 ENCOUNTER — Encounter (HOSPITAL_COMMUNITY)
Admission: RE | Admit: 2017-03-27 | Discharge: 2017-03-27 | Disposition: A | Discharge status: Home / Self Care | Payer: Medicare Other | Source: Ambulatory Visit | Attending: Cardiology | Admitting: Cardiology

## 2017-03-27 DIAGNOSIS — Z7982 Long term (current) use of aspirin: Secondary | ICD-10-CM | POA: Diagnosis not present

## 2017-03-27 DIAGNOSIS — K219 Gastro-esophageal reflux disease without esophagitis: Secondary | ICD-10-CM | POA: Diagnosis not present

## 2017-03-27 DIAGNOSIS — I2510 Atherosclerotic heart disease of native coronary artery without angina pectoris: Secondary | ICD-10-CM | POA: Diagnosis not present

## 2017-03-27 DIAGNOSIS — E119 Type 2 diabetes mellitus without complications: Secondary | ICD-10-CM | POA: Diagnosis not present

## 2017-03-27 DIAGNOSIS — Z951 Presence of aortocoronary bypass graft: Secondary | ICD-10-CM | POA: Diagnosis not present

## 2017-03-27 DIAGNOSIS — M160 Bilateral primary osteoarthritis of hip: Secondary | ICD-10-CM | POA: Diagnosis not present

## 2017-03-28 ENCOUNTER — Ambulatory Visit (HOSPITAL_COMMUNITY): Payer: Medicare Other | Attending: Cardiology

## 2017-03-28 ENCOUNTER — Telehealth: Payer: Self-pay | Admitting: Cardiology

## 2017-03-28 DIAGNOSIS — E785 Hyperlipidemia, unspecified: Secondary | ICD-10-CM | POA: Diagnosis not present

## 2017-03-28 DIAGNOSIS — Z951 Presence of aortocoronary bypass graft: Secondary | ICD-10-CM | POA: Insufficient documentation

## 2017-03-28 DIAGNOSIS — I10 Essential (primary) hypertension: Secondary | ICD-10-CM | POA: Diagnosis not present

## 2017-03-28 DIAGNOSIS — R079 Chest pain, unspecified: Secondary | ICD-10-CM | POA: Diagnosis not present

## 2017-03-28 LAB — MYOCARDIAL PERFUSION IMAGING
CHL CUP MPHR: 150 {beats}/min
CHL CUP NUCLEAR SSS: 14
CHL RATE OF PERCEIVED EXERTION: 18
CSEPED: 6 min
CSEPHR: 93 %
Estimated workload: 7 METS
Exercise duration (sec): 0 s
LV sys vol: 29 mL
LVDIAVOL: 71 mL (ref 62–150)
Peak HR: 139 {beats}/min
RATE: 0.37
Rest HR: 73 {beats}/min
SDS: 7
SRS: 7
TID: 0.92

## 2017-03-28 MED ORDER — TECHNETIUM TC 99M TETROFOSMIN IV KIT
32.7000 | PACK | Freq: Once | INTRAVENOUS | Status: AC | PRN
  Administered 2017-03-28: 32.7 via INTRAVENOUS
  Filled 2017-03-28: qty 33

## 2017-03-28 MED ORDER — TECHNETIUM TC 99M TETROFOSMIN IV KIT
10.6000 | PACK | Freq: Once | INTRAVENOUS | Status: AC | PRN
  Administered 2017-03-28: 10.6 via INTRAVENOUS
  Filled 2017-03-28: qty 11

## 2017-03-28 NOTE — Telephone Encounter (Signed)
Walk in pt Form-[facility] form Dropped off. Placed in [employer].

## 2017-03-29 ENCOUNTER — Telehealth: Payer: Self-pay | Admitting: Cardiology

## 2017-03-29 ENCOUNTER — Encounter (HOSPITAL_COMMUNITY)
Admission: RE | Admit: 2017-03-29 | Discharge: 2017-03-29 | Disposition: A | Discharge status: Home / Self Care | Payer: Medicare Other | Source: Ambulatory Visit | Attending: Cardiology | Admitting: Cardiology

## 2017-03-29 DIAGNOSIS — I2510 Atherosclerotic heart disease of native coronary artery without angina pectoris: Secondary | ICD-10-CM | POA: Diagnosis not present

## 2017-03-29 DIAGNOSIS — Z951 Presence of aortocoronary bypass graft: Secondary | ICD-10-CM | POA: Diagnosis not present

## 2017-03-29 DIAGNOSIS — Z7982 Long term (current) use of aspirin: Secondary | ICD-10-CM | POA: Diagnosis not present

## 2017-03-29 DIAGNOSIS — E119 Type 2 diabetes mellitus without complications: Secondary | ICD-10-CM | POA: Diagnosis not present

## 2017-03-29 DIAGNOSIS — M160 Bilateral primary osteoarthritis of hip: Secondary | ICD-10-CM | POA: Diagnosis not present

## 2017-03-29 DIAGNOSIS — K219 Gastro-esophageal reflux disease without esophagitis: Secondary | ICD-10-CM | POA: Diagnosis not present

## 2017-03-29 MED ORDER — ISOSORBIDE MONONITRATE ER 30 MG PO TB24
30.0000 mg | ORAL_TABLET | Freq: Every day | ORAL | 3 refills | Status: DC
Start: 2017-03-29 — End: 2017-11-06

## 2017-03-29 NOTE — Telephone Encounter (Signed)
-----   Message from Isaiah Serge, NP sent at 03/28/2017  4:05 PM EDT ----- Please let pt know that the stress test is not normal.  It may be that one of the grafts may have closed.  I reviewed with Dr. Marlou Porch.  Let's add Imdur 30 mg for now and see how he does.  If pain increases go to ER, otherwise continue rehab and keep appt with [name].  If pain continues we would proceed with cardiac cath.   If pt has questions or problems please have him seen earlier.  Thanks.

## 2017-03-29 NOTE — Progress Notes (Signed)
Pt arrived at cardiac rehab reporting he received Mychart summary of Nuclear stress test performed 03/28/2017.  Pt verbalizes concern about the result.  PC to Dr. Marlou Porch office to discuss.  Pt spoke with triage nurse, pt reports he was notified of results and follow up plan.   Pt further instructed in proper NTG use including when to call 911.  Pt also instructed to moderate activities to avoid overexertion.  Pt verbalized understanding.

## 2017-03-29 NOTE — Telephone Encounter (Signed)
New Message     Pt seen on my chart that he has a postive nm stress test, they what to know what course they should be following with pt

## 2017-03-29 NOTE — Telephone Encounter (Signed)
Patient at cardiac rehab and calling about test results that were released in My Chart. Reviewed results with patient. Patient made aware that the stress test is not normal. Patient made aware that Cecilie Kicks, NP reviewed with Dr. Marlou Porch and they want add Imdur 30 mg for now and see how he does. Prescription sent to patient's preferred pharmacy. Patient advised to start Imdur and take tylenol for HA. Patient advised to monitor his BP. Patient made aware if his pain increases to go to ER, otherwise continue rehab and keep appt with [name] on 04/09/17.  Patient made aware that if his pain continues he would need a cardiac cath. Patient advised to let us know if he has any questions or problems. Patient verbalized understanding.

## 2017-04-01 ENCOUNTER — Encounter (HOSPITAL_COMMUNITY)
Admission: RE | Admit: 2017-04-01 | Discharge: 2017-04-01 | Disposition: A | Discharge status: Home / Self Care | Payer: Medicare Other | Source: Ambulatory Visit | Attending: Cardiology | Admitting: Cardiology

## 2017-04-01 DIAGNOSIS — I2510 Atherosclerotic heart disease of native coronary artery without angina pectoris: Secondary | ICD-10-CM | POA: Diagnosis not present

## 2017-04-01 DIAGNOSIS — Z951 Presence of aortocoronary bypass graft: Secondary | ICD-10-CM

## 2017-04-01 DIAGNOSIS — M160 Bilateral primary osteoarthritis of hip: Secondary | ICD-10-CM | POA: Diagnosis not present

## 2017-04-01 DIAGNOSIS — K219 Gastro-esophageal reflux disease without esophagitis: Secondary | ICD-10-CM | POA: Diagnosis not present

## 2017-04-01 DIAGNOSIS — Z7982 Long term (current) use of aspirin: Secondary | ICD-10-CM | POA: Diagnosis not present

## 2017-04-01 DIAGNOSIS — E119 Type 2 diabetes mellitus without complications: Secondary | ICD-10-CM | POA: Diagnosis not present

## 2017-04-03 ENCOUNTER — Telehealth: Payer: Self-pay | Admitting: Cardiology

## 2017-04-03 ENCOUNTER — Encounter (HOSPITAL_COMMUNITY)
Admission: RE | Admit: 2017-04-03 | Discharge: 2017-04-03 | Disposition: A | Discharge status: Home / Self Care | Payer: Medicare Other | Source: Ambulatory Visit | Attending: Cardiology | Admitting: Cardiology

## 2017-04-03 ENCOUNTER — Telehealth (HOSPITAL_COMMUNITY): Payer: Self-pay | Admitting: Cardiac Rehabilitation

## 2017-04-03 DIAGNOSIS — Z951 Presence of aortocoronary bypass graft: Secondary | ICD-10-CM | POA: Diagnosis not present

## 2017-04-03 DIAGNOSIS — M160 Bilateral primary osteoarthritis of hip: Secondary | ICD-10-CM | POA: Diagnosis not present

## 2017-04-03 DIAGNOSIS — K219 Gastro-esophageal reflux disease without esophagitis: Secondary | ICD-10-CM | POA: Diagnosis not present

## 2017-04-03 DIAGNOSIS — Z7982 Long term (current) use of aspirin: Secondary | ICD-10-CM | POA: Diagnosis not present

## 2017-04-03 DIAGNOSIS — I2510 Atherosclerotic heart disease of native coronary artery without angina pectoris: Secondary | ICD-10-CM | POA: Diagnosis not present

## 2017-04-03 DIAGNOSIS — E119 Type 2 diabetes mellitus without complications: Secondary | ICD-10-CM | POA: Diagnosis not present

## 2017-04-03 MED ORDER — METOPROLOL TARTRATE 50 MG PO TABS: 50.0000 mg | ORAL_TABLET | Freq: Two times a day (BID) | ORAL | 3 refills | Status: DC

## 2017-04-03 NOTE — Telephone Encounter (Signed)
Lmtcb to go over recommendations per [name], PA to increase Metoprolol Tartrate to 50 mg twice daily, keep follow up as planned as well as ok to continue with cardiac rehab for now, if symptoms do worsen then to go to the ED.   Ptcb and has been advised of the recommendations to increase Metoprolol Tart to 50 mg BID, keep f/u 5/1, cont. Cardiac rehab and go to ED if symptoms worsen. Pt verbalized understanding to plan of care. I will also let cardiac rehab know of the plan of care for the pt as well. Pt thanked me for my call and help today.

## 2017-04-03 NOTE — Telephone Encounter (Signed)
pc to office to notify of continued c/o chest pain on exertion at home and cardiac rehab.  Pt reports he experienced chest pain while square dancing last night that resolved with rest. Pt also c/o chest pain on treadmill today that improved with decreased workload.  Pt has not used NTG SL. Pt notes overall the symptoms have decreased since starting Imdur 30mg , however episodes still occur.   Pt has previously scheduled appt with [name] 04/09/2017.  LM for triage nurse to contact pt to advise.

## 2017-04-03 NOTE — Telephone Encounter (Signed)
Mechele Claude calling from Cardiac Rehab and wanted to let us know that the patient did have some chest discomfort with activity this morning at rehab that resolved at rest. She states that he was able to finish exercising.  She states that the patient did not have any other accompanying symptoms. Patient had an abnormal stress test on 4/19 and was started on Imdur 30 mg daily on 4/20 (see phone note from 4/20). She states that the patient states that the imdur has helped but he is still having some discomfort with activity. She states that the patient went square dancing last night and had some discomfort but resolved with rest. Mechele Claude wanted to make [place] aware. Patient has an appointment with [name], PA on 04/09/17. Information forwarded to Dr. Marlou Porch and [name], PA.

## 2017-04-03 NOTE — Telephone Encounter (Signed)
Increase Metoprolol tartrate to 50 mg Twice daily. Keep follow up as planned. Continue with cardiac rehabilitation for now. If symptoms worsen, go to the ED. [name], PA-C    04/03/2017 12:33 PM

## 2017-04-03 NOTE — Progress Notes (Signed)
Cardiac Individual Treatment Plan  Patient Details  Name: Andrew Williamson MRN: [id number] Date of Birth: March 08, 1946 Referring Provider:     [facility] from 02/05/2017 in [place]  Referring Provider  Candee Furbish MD      Initial Encounter Date:    CARDIAC REHAB PHASE II ORIENTATION from 02/05/2017 in [place]  Date  02/05/17  Referring Provider  Candee Furbish MD      Visit Diagnosis: 12/19/16 S/P CABG x 3  Patient's Home Medications on Admission:  Current Outpatient Prescriptions:  .  aspirin EC 325 MG tablet, Take 1 tablet (325 mg total) by mouth daily., Disp: , Rfl:  .  gabapentin (NEURONTIN) 300 MG capsule, Take 300 mg by mouth 3 (three) times daily. , Disp: , Rfl:  .  isosorbide mononitrate (IMDUR) 30 MG 24 hr tablet, Take 1 tablet (30 mg total) by mouth daily., Disp: 90 tablet, Rfl: 3 .  lisinopril (PRINIVIL,ZESTRIL) 5 MG tablet, Take 1 tablet (5 mg total) by mouth daily., Disp: 30 tablet, Rfl: 1 .  metFORMIN (GLUCOPHAGE) 1000 MG tablet, Take 1,000 mg by mouth 2 (two) times daily with a meal., Disp: , Rfl:  .  metoprolol (LOPRESSOR) 50 MG tablet, Take 1 tablet (50 mg total) by mouth 2 (two) times daily., Disp: 180 tablet, Rfl: 3 .  Multiple Vitamins-Minerals (CENTRUM SILVER PO), Take 1 tablet by mouth daily., Disp: , Rfl:  .  omeprazole (PRILOSEC) 40 MG capsule, TAKE ONE CAPSULE EVERY DAY ONCE A DAY ORALLY 30 DAYS, Disp: , Rfl: 3 .  rosuvastatin (CRESTOR) 20 MG tablet, Take 20 mg by mouth daily. 1/2 tab daily, Disp: , Rfl:   Past Medical History: Past Medical History:  Diagnosis Date  . Allergic rhinitis   . Coronary artery disease involving native coronary artery of native heart without angina pectoris 01/07/2017   a - Myoview 12/17:  EF 55, inf-lateral ischemia; intermediate risk // b - LHC 12/05/16: oLM 50, oLAD 40, dLAD 50, RI 25, oLCx 95, mLCx 95, RCA luminal irregs // c - Echo  12/17: EF 55-60, no RWMA, Gr 1 DD // d. 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 DJD (degenerative joint disease)    hips  . ED (erectile dysfunction)   . GERD (gastroesophageal reflux disease)   . Gynecomastia, male   . History of echocardiogram    Echo 12/17: EF 55-60, normal wall motion, grade 1 diastolic dysfunction  . History of nuclear stress test    Myoview 12/17: EF 55, inf-lateral ischemia; intermediate risk  . HLD (hyperlipidemia)    hx of ? elevated LFTs on statin Rx  . Hypertension   . Obesity   . Prostate cancer [facility])    s/p radical prostatectomy  . Spinal stenosis     Tobacco Use: History  Smoking Status  . Never Smoker  Smokeless Tobacco  . Current User  . Types: Chew    Labs: Recent Review Flowsheet Data    Labs for ITP Cardiac and Pulmonary Rehab Latest Ref Rng & Units 12/19/2016 12/19/2016 12/19/2016 12/19/2016 12/20/2016   Hemoglobin A1c 4.8 - 5.6 % - - - - -   PHART 7.350 - 7.450 7.342(L) 7.312(L) - 7.443 -   PCO2ART 32.0 - 48.0 mmHg 40.8 38.4 - 38.6 -   HCO3 20.0 - 28.0 mmol/L 22.6 19.4(L) - 26.2 -   TCO2  0 - 100 mmol/L 24 20 21 27 25    ACIDBASEDEF 0.0 - 2.0 mmol/L 3.0(H) 6.0(H) - - -   O2SAT % 96.0 99.0 - 99.0 -      Capillary Blood Glucose: Lab Results  Component Value Date   GLUCAP 116 (H) 03/13/2017   GLUCAP 102 (H) 03/04/2017   GLUCAP 137 (H) 02/18/2017   GLUCAP 204 (H) 02/15/2017   GLUCAP 98 02/13/2017     Exercise Target Goals:    Exercise Program Goal: Individual exercise prescription set with THRR, safety & activity barriers. Participant demonstrates ability to understand and report RPE using BORG scale, to self-measure pulse accurately, and to acknowledge the importance of the exercise prescription.  Exercise Prescription Goal: Starting with aerobic activity 30 plus minutes a day, 3 days per week for initial exercise prescription. Provide home exercise  prescription and guidelines that participant acknowledges understanding prior to discharge.  Activity Barriers & Risk Stratification:     Activity Barriers & Cardiac Risk Stratification - 02/05/17 1120      Activity Barriers & Cardiac Risk Stratification   Activity Barriers Back Problems   Cardiac Risk Stratification High      6 Minute Walk:     6 Minute Walk    Row Name 02/05/17 1031         6 Minute Walk   Phase Initial     Distance 1400 feet     Walk Time 6 minutes     # of Rest Breaks 0     MPH 2.65     METS 3.07     RPE 10     VO2 Peak 10.74     Symptoms Yes (comment)     Comments Patient c/o dyspnea.     Resting HR 71 bpm     Resting BP 110/57     Max Ex. HR 102 bpm     Max Ex. BP 148/78     2 Minute Post BP 104/60        Oxygen Initial Assessment:   Oxygen Re-Evaluation:   Oxygen Discharge (Final Oxygen Re-Evaluation):   Initial Exercise Prescription:     Initial Exercise Prescription - 02/05/17 1100      Date of Initial Exercise RX and Referring Provider   Date 02/05/17   Referring Provider Candee Furbish MD     Treadmill   MPH 2.3   Grade 1   Minutes 10   METs 3.08     Bike   Level 0.5   Minutes 10   METs 1.99     NuStep   Level 3   SPM 80   Minutes 10   METs 2     Prescription Details   Frequency (times per week) 3   Duration Progress to 30 minutes of continuous aerobic without signs/symptoms of physical distress     Intensity   THRR 40-80% of Max Heartrate 60-120   Ratings of Perceived Exertion 11-13   Perceived Dyspnea 0-4     Progression   Progression Continue to progress workloads to maintain intensity without signs/symptoms of physical distress.     Resistance Training   Training Prescription Yes   Weight 2lbs   Reps 10-15      Perform Capillary Blood Glucose checks as needed.  Exercise Prescription Changes:     Exercise Prescription Changes    Row Name 02/19/17 1400 03/04/17 1600 03/19/17 1600 04/01/17  1600       Response to Exercise   Blood Pressure (  Admit) 104/60 128/62 116/62 104/68    Blood Pressure (Exercise) 140/70 140/74 134/70 130/80    Blood Pressure (Exit) 112/60 116/70 122/74 104/60    Heart Rate (Admit) 75 bpm 82 bpm 80 bpm 66 bpm    Heart Rate (Exercise) 107 bpm 126 bpm 111 bpm 99 bpm    Heart Rate (Exit) 75 bpm 78 bpm 81 bpm 75 bpm    Rating of Perceived Exertion (Exercise) 12 12 12 11     Symptoms none none none none    Comments  -  -  - decreased WL due to c/o CP with exertion    Duration Progress to 30 minutes of  aerobic without signs/symptoms of physical distress Progress to 30 minutes of  aerobic without signs/symptoms of physical distress Progress to 30 minutes of  aerobic without signs/symptoms of physical distress Progress to 30 minutes of  aerobic without signs/symptoms of physical distress    Intensity THRR unchanged THRR unchanged THRR unchanged THRR unchanged      Progression   Progression Continue to progress workloads to maintain intensity without signs/symptoms of physical distress. Continue to progress workloads to maintain intensity without signs/symptoms of physical distress. Continue to progress workloads to maintain intensity without signs/symptoms of physical distress. Continue to progress workloads to maintain intensity without signs/symptoms of physical distress.    Average METs 2.1 3.3 3.3 3.1      Resistance Training   Training Prescription Yes Yes Yes Yes    Weight 2lbs 4lbs 3lbs 3lbs    Reps 10-15 10-15 10-15 10-15    Time 10 Minutes 10 Minutes 10 Minutes 10 Minutes      Treadmill   MPH 2.3 2.6 2.6 2.6    Grade 1 2 2  0    Minutes 10 10 10 10     METs 3.08 3.71 3.71 2.99      Bike   Level 0.7 1.5 1.5  reduced speed due to chest discomfort and fatigue 1  upright scifit; decr. WL due to chest discomfort    Minutes 10 10 10 10     METs 2.4 3 3 3       NuStep   Level 3 4 4 4     SPM 80 80 80 90    Minutes 10 10 10 10     METs 3.1 3.4 3.3 3.4       Home Exercise Plan   Plans to continue exercise at  - Home (comment) Home (comment) Home (comment)    Frequency  - Add 2 additional days to program exercise sessions. Add 2 additional days to program exercise sessions. Add 2 additional days to program exercise sessions.    Initial Home Exercises Provided  - 02/25/17 02/25/17 02/25/17       Exercise Comments:     Exercise Comments    Row Name 02/28/17 1554 04/02/17 1349         Exercise Comments Reviewed METs and goals. Pt is tolerating exercise well; will continue to monitor exercise progression Reviewed METs and goals. Pt is tolerating exercise well; will continue to monitor exercise progression         Exercise Goals and Review:     Exercise Goals    Row Name 02/05/17 0818             Exercise Goals   Increase Physical Activity Yes       Intervention Provide advice, education, support and counseling about physical activity/exercise needs.;Develop an individualized exercise prescription for aerobic and resistive training based  on initial evaluation findings, risk stratification, comorbidities and participant's personal goals.       Expected Outcomes Achievement of increased cardiorespiratory fitness and enhanced flexibility, muscular endurance and strength shown through measurements of functional capacity and personal statement of participant.       Increase Strength and Stamina Yes       Intervention Provide advice, education, support and counseling about physical activity/exercise needs.;Develop an individualized exercise prescription for aerobic and resistive training based on initial evaluation findings, risk stratification, comorbidities and participant's personal goals.       Expected Outcomes Achievement of increased cardiorespiratory fitness and enhanced flexibility, muscular endurance and strength shown through measurements of functional capacity and personal statement of participant.          Exercise Goals  Re-Evaluation :     Exercise Goals Re-Evaluation    Row Name 02/25/17 8502 02/28/17 1555 04/02/17 1349         Exercise Goal Re-Evaluation   Exercise Goals Review Increase Physical Activity;Increase Strenth and Stamina Increase Strenth and Stamina;Increase Physical Activity Increase Physical Activity;Increase Strenth and Stamina     Comments Reviewed home exercise with pt today.  Pt plans to attend [employer] [facility] in which he will use recumbent bike and treadmill for exercise 2-3x/week in addition to coming to cardiac rehab. Reviewed THR, pulse, RPE, sign and symptoms, NTG use, and when to call 911 or MD.  Also discussed weather considerations and indoor options.  Pt voiced understanding. 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  Pt stated feeling stronger and having more energy. However pt has c/o CP with exertion (bike/TM). Modified exercise equipment and pt f/u with cardiologist. Decreased WL and pt reported no CP at this time.      Expected Outcomes Pt will be complaint with HEP and improve in cardiorespiratory fitness Pt will continue to improve in cardiorespiratory fitness Pt will continue to improve in cardiorespiratory fitness without s/s of CP or anginal-like symptoms         Discharge Exercise Prescription (Final Exercise Prescription Changes):     Exercise Prescription Changes - 04/01/17 1600      Response to Exercise   Blood Pressure (Admit) 104/68   Blood Pressure (Exercise) 130/80   Blood Pressure (Exit) 104/60   Heart Rate (Admit) 66 bpm   Heart Rate (Exercise) 99 bpm   Heart Rate (Exit) 75 bpm   Rating of Perceived Exertion (Exercise) 11   Symptoms none   Comments decreased WL due to c/o CP with exertion   Duration Progress to 30 minutes of  aerobic without signs/symptoms of physical distress   Intensity THRR unchanged     Progression   Progression Continue to progress workloads to maintain intensity without signs/symptoms of physical distress.   Average METs  3.1     Resistance Training   Training Prescription Yes   Weight 3lbs   Reps 10-15   Time 10 Minutes     Treadmill   MPH 2.6   Grade 0   Minutes 10   METs 2.99     Bike   Level 1  upright scifit; decr. WL due to chest discomfort   Minutes 10   METs 3     NuStep   Level 4   SPM 90   Minutes 10   METs 3.4     Home Exercise Plan   Plans to continue exercise at Home (comment)   Frequency Add 2 additional days to program exercise sessions.  Initial Home Exercises Provided 02/25/17      Nutrition:  Target Goals: Understanding of nutrition guidelines, daily intake of sodium 1500mg , cholesterol 200mg , calories 30% from fat and 7% or less from saturated fats, daily to have 5 or more servings of fruits and vegetables.  Biometrics:     Pre Biometrics - 02/05/17 1034      Pre Biometrics   Height 5\' 9"  (1.753 m)   Weight 207 lb 0.2 oz (93.9 kg)   Waist Circumference 41.5 inches   Hip Circumference 44.5 inches   Waist to Hip Ratio 0.93 %   BMI (Calculated) 30.6   Triceps Skinfold 35 mm   % Body Fat 32.8 %   Grip Strength 45 kg   Flexibility 8 in   Single Leg Stand 9.4 seconds       Nutrition Therapy Plan and Nutrition Goals:     Nutrition Therapy & Goals - 03/01/17 0944      Nutrition Therapy   Diet Carb Modified, Therapeutic Lifestyle Changes     Personal Nutrition Goals   Nutrition Goal Wt loss of 1-2 lb/week to a wt loss goal of 6-24 lb at graduation from Cardiac Rehab   Personal Goal #2 Pt to identify and limit sources of saturated fat, trans fat, and sodium.    Personal Goal #3 Pt to continue to have CBG concentrations in the normal range or as close to normal as is safely possible.     Intervention Plan   Intervention Prescribe, educate and counsel regarding individualized specific dietary modifications aiming towards targeted core components such as weight, hypertension, lipid management, diabetes, heart failure and other comorbidities.   Expected  Outcomes Short Term Goal: Understand basic principles of dietary content, such as calories, fat, sodium, cholesterol and nutrients.;Long Term Goal: Adherence to prescribed nutrition plan.      Nutrition Discharge: Nutrition Scores:     Nutrition Assessments - 03/01/17 0945      MEDFICTS Scores   Pre Score 65      Nutrition Goals Re-Evaluation:   Nutrition Goals Re-Evaluation:   Nutrition Goals Discharge (Final Nutrition Goals Re-Evaluation):   Psychosocial: Target Goals: Acknowledge presence or absence of significant depression and/or stress, maximize coping skills, provide positive support system. Participant is able to verbalize types and ability to use techniques and skills needed for reducing stress and depression.  Initial Review & Psychosocial Screening:     Initial Psych Review & Screening - 02/11/17 1706      Initial Review   Current issues with None Identified     Family Dynamics   Good Support System? Yes   Comments Upon brief assessment, no psychosocial needs identified, no interventions necessary      Barriers   Psychosocial barriers to participate in program There are no identifiable barriers or psychosocial needs.     Screening Interventions   Interventions Encouraged to exercise;Provide feedback about the scores to participant      Quality of Life Scores:     Quality of Life - 02/05/17 1006      Quality of Life Scores   Health/Function Pre 26.93 %   Socioeconomic Pre 28.07 %   Psych/Spiritual Pre 28.18 %   Family Pre 28.5 %   GLOBAL Pre 27.65 %      PHQ-9: Recent Review Flowsheet Data    Depression screen [facility] 2/9 02/11/2017   Decreased Interest 0   Down, Depressed, Hopeless 0   PHQ - 2 Score 0     Interpretation  of Total Score  Total Score Depression Severity:  1-4 = Minimal depression, 5-9 = Mild depression, 10-14 = Moderate depression, 15-19 = Moderately severe depression, 20-27 = Severe depression   Psychosocial Evaluation and  Intervention:     Psychosocial Evaluation - 03/05/17 1641      Psychosocial Evaluation & Interventions   Interventions Encouraged to exercise with the program and follow exercise prescription   Comments no psychosocial needs identified, no interventions necessary.    Expected Outcomes pt will demonstrate good coping skills with positive hopeful outlook.    Continue Psychosocial Services  No Follow up required      Psychosocial Re-Evaluation:     Psychosocial Re-Evaluation    [place] Name 03/05/17 1641 04/02/17 0716           Psychosocial Re-Evaluation   Current issues with None Identified  -      Comments no psychosocial needs identifed, no interventions necessary. pt is looking forward to being able to resume usual activities of golf and square dancing.  no psychosocial needs identifed, no interventions necessary. pt has resumed square dancing.        Expected Outcomes pt will deomonstrate positive outlook with good coping skills.   pt will deomonstrate positive outlook with good coping skills.        Interventions Encouraged to attend Cardiac Rehabilitation for the exercise Encouraged to attend Cardiac Rehabilitation for the exercise      Continue Psychosocial Services  No Follow up required No Follow up required         Psychosocial Discharge (Final Psychosocial Re-Evaluation):     Psychosocial Re-Evaluation - 04/02/17 0716      Psychosocial Re-Evaluation   Comments no psychosocial needs identifed, no interventions necessary. pt has resumed square dancing.     Expected Outcomes pt will deomonstrate positive outlook with good coping skills.     Interventions Encouraged to attend Cardiac Rehabilitation for the exercise   Continue Psychosocial Services  No Follow up required      Vocational Rehabilitation: Provide vocational rehab assistance to qualifying candidates.   Vocational Rehab Evaluation & Intervention:     Vocational Rehab - 02/05/17 1219      Initial  Vocational Rehab Evaluation & Intervention   Assessment shows need for Vocational Rehabilitation No      Education: Education Goals: Education classes will be provided on a weekly basis, covering required topics. Participant will state understanding/return demonstration of topics presented.  Learning Barriers/Preferences:     Learning Barriers/Preferences - 02/05/17 0818      Learning Barriers/Preferences   Learning Barriers Sight   Learning Preferences Written Material;Skilled Demonstration;Pictoral;Verbal Instruction;Video      Education Topics: Count Your Pulse:  -Group instruction provided by verbal instruction, demonstration, patient participation and written materials to support subject.  Instructors address importance of being able to find your pulse and how to count your pulse when at home without a heart monitor.  Patients get hands on experience counting their pulse with staff help and individually.   Heart Attack, Angina, and Risk Factor Modification:  -Group instruction provided by verbal instruction, video, and written materials to support subject.  Instructors address signs and symptoms of angina and heart attacks.    Also discuss risk factors for heart disease and how to make changes to improve heart health risk factors.   CARDIAC REHAB PHASE II EXERCISE from 03/29/2017 in [place]  Date  03/06/17  Instruction Review Code  2- meets goals/outcomes  Functional Fitness:  -Group instruction provided by verbal instruction, demonstration, patient participation, and written materials to support subject.  Instructors address safety measures for doing things around the house.  Discuss how to get up and down off the floor, how to pick things up properly, how to safely get out of a chair without assistance, and balance training.   CARDIAC REHAB PHASE II EXERCISE from 03/29/2017 in [place]  Date  03/29/17   Educator  Cleda Mccreedy, [place]  Instruction Review Code  2- meets goals/outcomes      Meditation and Mindfulness:  -Group instruction provided by verbal instruction, patient participation, and written materials to support subject.  Instructor addresses importance of mindfulness and meditation practice to help reduce stress and improve awareness.  Instructor also leads participants through a meditation exercise.    CARDIAC REHAB PHASE II EXERCISE from 03/29/2017 in [place]  Date  03/27/17  Instruction Review Code  2- meets goals/outcomes      Stretching for Flexibility and Mobility:  -Group instruction provided by verbal instruction, patient participation, and written materials to support subject.  Instructors lead participants through series of stretches that are designed to increase flexibility thus improving mobility.  These stretches are additional exercise for major muscle groups that are typically performed during regular warm up and cool down.   CARDIAC REHAB PHASE II EXERCISE from 03/29/2017 in [place]  Date  03/01/17  Educator  Cleda Mccreedy, [place]  Instruction Review Code  2- meets goals/outcomes      Hands Only CPR Anytime:  -Group instruction provided by verbal instruction, video, patient participation and written materials to support subject.  Instructors co-teach with [facility] video for hands only CPR.  Participants get hands on experience with mannequins.   Nutrition I class: Heart Healthy Eating:  -Group instruction provided by PowerPoint slides, verbal discussion, and written materials to support subject matter. The instructor gives an explanation and review of the Therapeutic Lifestyle Changes diet recommendations, which includes a discussion on lipid goals, dietary fat, sodium, fiber, plant stanol/sterol esters, sugar, and the components of a well-balanced, healthy diet.   CARDIAC REHAB PHASE II  EXERCISE from 03/29/2017 in [place]  Date  02/12/17  Educator  RD  Instruction Review Code  2- meets goals/outcomes      Nutrition II class: Lifestyle Skills:  -Group instruction provided by PowerPoint slides, verbal discussion, and written materials to support subject matter. The instructor gives an explanation and review of label reading, grocery shopping for heart health, heart healthy recipe modifications, and ways to make healthier choices when eating out.   CARDIAC REHAB PHASE II EXERCISE from 03/29/2017 in [place]  Date  02/19/17  Educator  RD  Instruction Review Code  2- meets goals/outcomes      Diabetes Question & Answer:  -Group instruction provided by PowerPoint slides, verbal discussion, and written materials to support subject matter. The instructor gives an explanation and review of diabetes co-morbidities, pre- and post-prandial blood glucose goals, pre-exercise blood glucose goals, signs, symptoms, and treatment of hypoglycemia and hyperglycemia, and foot care basics.   CARDIAC REHAB PHASE II EXERCISE from 03/29/2017 in [place]  Date  03/15/17  Educator  RD  Instruction Review Code  2- meets goals/outcomes      Diabetes Blitz:  -Group instruction provided by PowerPoint slides, verbal discussion, and written  materials to support subject matter. The instructor gives an explanation and review of the physiology behind type 1 and type 2 diabetes, diabetes medications and rational behind using different medications, pre- and post-prandial blood glucose recommendations and Hemoglobin A1c goals, diabetes diet, and exercise including blood glucose guidelines for exercising safely.    CARDIAC REHAB PHASE II EXERCISE from 03/29/2017 in [place]  Date  03/19/17  Educator  RD  Instruction Review Code  2- meets goals/outcomes      Portion  Distortion:  -Group instruction provided by PowerPoint slides, verbal discussion, written materials, and food models to support subject matter. The instructor gives an explanation of serving size versus portion size, changes in portions sizes over the last 20 years, and what consists of a serving from each food group.   Stress Management:  -Group instruction provided by verbal instruction, video, and written materials to support subject matter.  Instructors review role of stress in heart disease and how to cope with stress positively.     CARDIAC REHAB PHASE II EXERCISE from 03/29/2017 in [place]  Date  02/13/17  Instruction Review Code  2- meets goals/outcomes      Exercising on Your Own:  -Group instruction provided by verbal instruction, power point, and written materials to support subject.  Instructors discuss benefits of exercise, components of exercise, frequency and intensity of exercise, and end points for exercise.  Also discuss use of nitroglycerin and activating EMS.  Review options of places to exercise outside of rehab.  Review guidelines for sex with heart disease.   CARDIAC REHAB PHASE II EXERCISE from 03/29/2017 in [place]  Date  02/27/17  Educator  Cleda Mccreedy, [place]  Instruction Review Code  2- meets goals/outcomes      Cardiac Drugs I:  -Group instruction provided by verbal instruction and written materials to support subject.  Instructor reviews cardiac drug classes: antiplatelets, anticoagulants, beta blockers, and statins.  Instructor discusses reasons, side effects, and lifestyle considerations for each drug class.   CARDIAC REHAB PHASE II EXERCISE from 03/29/2017 in [place]  Date  03/20/17  Educator  Kennyth Lose  Instruction Review Code  2- meets goals/outcomes      Cardiac Drugs II:  -Group instruction provided by verbal instruction and written materials to  support subject.  Instructor reviews cardiac drug classes: angiotensin converting enzyme inhibitors (ACE-I), angiotensin II receptor blockers (ARBs), nitrates, and calcium channel blockers.  Instructor discusses reasons, side effects, and lifestyle considerations for each drug class.   CARDIAC REHAB PHASE II EXERCISE from 03/29/2017 in [place]  Date  02/20/17  Educator  Kennyth Lose  Instruction Review Code  2- meets goals/outcomes      Anatomy and Physiology of the Circulatory System:  -Group instruction provided by verbal instruction, video, and written materials to support subject.  Reviews functional anatomy of heart, how it relates to various diagnoses, and what role the heart plays in the overall system.   CARDIAC REHAB PHASE II EXERCISE from 03/29/2017 in [place]  Date  03/13/17  Instruction Review Code  2- meets goals/outcomes      Knowledge Questionnaire Score:     Knowledge Questionnaire Score - 02/05/17 1006      Knowledge Questionnaire Score   Pre Score 22/24      Core Components/Risk Factors/Patient Goals at Admission:     Personal Goals  and Risk Factors at Admission - 03/01/17 0945      Core Components/Risk Factors/Patient Goals on Admission    Weight Management Yes;Obesity;Weight Loss   Intervention Weight Management: Develop a combined nutrition and exercise program designed to reach desired caloric intake, while maintaining appropriate intake of nutrient and fiber, sodium and fats, and appropriate energy expenditure required for the weight goal.;Weight Management: Provide education and appropriate resources to help participant work on and attain dietary goals.;Weight Management/Obesity: Establish reasonable short term and long term weight goals.;Obesity: Provide education and appropriate resources to help participant work on and attain dietary goals.   Expected Outcomes Short Term: Continue to assess and  modify interventions until short term weight is achieved;Long Term: Adherence to nutrition and physical activity/exercise program aimed toward attainment of established weight goal;Weight Maintenance: Understanding of the daily nutrition guidelines, which includes 25-35% calories from fat, 7% or less cal from saturated fats, less than 200mg  cholesterol, less than 1.5gm of sodium, & 5 or more servings of fruits and vegetables daily;Weight Loss: Understanding of general recommendations for a balanced deficit meal plan, which promotes 1-2 lb weight loss per week and includes a negative energy balance of [phone] kcal/d;Understanding recommendations for meals to include 15-35% energy as protein, 25-35% energy from fat, 35-60% energy from carbohydrates, less than 200mg  of dietary cholesterol, 20-35 gm of total fiber daily;Understanding of distribution of calorie intake throughout the day with the consumption of 4-5 meals/snacks   Diabetes Yes   Intervention Provide education about signs/symptoms and action to take for hypo/hyperglycemia.;Provide education about proper nutrition, including hydration, and aerobic/resistive exercise prescription along with prescribed medications to achieve blood glucose in normal ranges: Fasting glucose 65-99 mg/dL   Expected Outcomes Long Term: Attainment of HbA1C < 7%.;Short Term: Participant verbalizes understanding of the signs/symptoms and immediate care of hyper/hypoglycemia, proper foot care and importance of medication, aerobic/resistive exercise and nutrition plan for blood glucose control.   Hypertension Yes   Intervention Provide education on lifestyle modifcations including regular physical activity/exercise, weight management, moderate sodium restriction and increased consumption of fresh fruit, vegetables, and low fat dairy, alcohol moderation, and smoking cessation.;Monitor prescription use compliance.   Expected Outcomes Short Term: Continued assessment and  intervention until BP is < 140/9mm HG in hypertensive participants. < 130/61mm HG in hypertensive participants with diabetes, heart failure or chronic kidney disease.;Long Term: Maintenance of blood pressure at goal levels.   Lipids Yes   Intervention Provide education and support for participant on nutrition & aerobic/resistive exercise along with prescribed medications to achieve LDL 70mg , HDL >40mg .   Expected Outcomes Short Term: Participant states understanding of desired cholesterol values and is compliant with medications prescribed. Participant is following exercise prescription and nutrition guidelines.;Long Term: Cholesterol controlled with medications as prescribed, with individualized exercise RX and with personalized nutrition plan. Value goals: LDL < 70mg , HDL > 40 mg.   Personal Goal Other Yes   Personal Goal Be able to get back to playing golf without being limited by back pain, Long term: lose weight, goal at 185-190lbs   Intervention Provide exercise programming and education on nutrition couseling to assist with weightloss and improving cardiorespiratory fitness.   Expected Outcomes Pt will be able to return to hobbies of bowling, playing golf and square dancing without being limited by back pain and endurance.      Core Components/Risk Factors/Patient Goals Review:      Goals and Risk Factor Review    Row Name 02/27/17 1222 03/05/17 1641 04/02/17 6578  Core Components/Risk Factors/Patient Goals Review   Personal Goals Review Weight Management/Obesity;Diabetes;Hypertension;Lipids;Other Weight Management/Obesity;Diabetes;Hypertension;Lipids;Other Weight Management/Obesity;Diabetes;Hypertension;Lipids;Other     Review Pt progressing as expected with excellent participation in CR.   pt exhibits appropriate DM self management.  pt reports activities are getting easier for him to perform without symptoms.  pt is using stationary bike 54minutes at home without symptoms on  non CR days.  Will continue to monitor and intervene as necessary.  Pt progressing as expected with excellent participation in CR.   pt exhibits appropriate DM self management.  pt reports activities are getting easier for him to perform without symptoms.  pt is using stationary bike 91minutes at home without symptoms on non CR days.  Will continue to monitor and intervene as necessary.  Pt progressing as expected with excellent participation in CR.   pt exhibits appropriate DM self management. pt is experiencing CP with activities.  Nuclear stress test obtained.  pt awaiting MD OV to discuss findings and further testing.  pt has started Imdur 30mg .  pt workloads have been decreased until further testing obtained.      Expected Outcomes pt will continue CR attendance focusing on exercise, education and nutrition to reduce RF including DM, HTN, lipds and obesity.   pt will continue CR attendance focusing on exercise, education and nutrition to reduce RF including DM, HTN, lipds and obesity.   pt will continue CR attendance focusing on exercise, education and nutrition to reduce RF including DM, HTN, lipds and obesity.          Core Components/Risk Factors/Patient Goals at Discharge (Final Review):      Goals and Risk Factor Review - 04/02/17 0714      Core Components/Risk Factors/Patient Goals Review   Personal Goals Review Weight Management/Obesity;Diabetes;Hypertension;Lipids;Other   Review Pt progressing as expected with excellent participation in CR.   pt exhibits appropriate DM self management. pt is experiencing CP with activities.  Nuclear stress test obtained.  pt awaiting MD OV to discuss findings and further testing.  pt has started Imdur 30mg .  pt workloads have been decreased until further testing obtained.    Expected Outcomes pt will continue CR attendance focusing on exercise, education and nutrition to reduce RF including DM, HTN, lipds and obesity.        ITP Comments:     ITP  Comments    Row Name 02/05/17 0815 03/22/17 0856         ITP Comments Dr. Fransico Him, Medical Director 03/22/17, HTN meets goals/outcomes         Comments: Pt is making expected progress toward personal goals after completing 22 sessions. Recommend continued exercise and life style modification education including  stress management and relaxation techniques to decrease cardiac risk profile.

## 2017-04-03 NOTE — Telephone Encounter (Signed)
New message      Pt c/o of Chest Pain: STAT if CP now or developed within 24 hours  1. Are you having CP right now?  Had chest pain this am at cardiac rehab  2. Are you experiencing any other symptoms (ex. SOB, nausea, vomiting, sweating)?  no 3. How long have you been experiencing CP?  For a few weeks  4. Is your CP continuous or coming and going?  Continuous with exercise 5. Have you taken Nitroglycerin?  no? Pt was able to exercise and will be going home in about 10-15 min.  He has an appt next week.  Nurse wanted to report these symptoms

## 2017-04-04 NOTE — Telephone Encounter (Signed)
Thank you. I agree with plan. But see how he does with increased antianginal medications. Candee Furbish, MD

## 2017-04-05 ENCOUNTER — Encounter (HOSPITAL_COMMUNITY)
Admission: RE | Admit: 2017-04-05 | Discharge: 2017-04-05 | Disposition: A | Discharge status: Home / Self Care | Payer: Medicare Other | Source: Ambulatory Visit | Attending: Cardiology | Admitting: Cardiology

## 2017-04-05 DIAGNOSIS — E119 Type 2 diabetes mellitus without complications: Secondary | ICD-10-CM | POA: Diagnosis not present

## 2017-04-05 DIAGNOSIS — Z7982 Long term (current) use of aspirin: Secondary | ICD-10-CM | POA: Diagnosis not present

## 2017-04-05 DIAGNOSIS — K219 Gastro-esophageal reflux disease without esophagitis: Secondary | ICD-10-CM | POA: Diagnosis not present

## 2017-04-05 DIAGNOSIS — I2510 Atherosclerotic heart disease of native coronary artery without angina pectoris: Secondary | ICD-10-CM | POA: Diagnosis not present

## 2017-04-05 DIAGNOSIS — Z951 Presence of aortocoronary bypass graft: Secondary | ICD-10-CM | POA: Diagnosis not present

## 2017-04-05 DIAGNOSIS — M160 Bilateral primary osteoarthritis of hip: Secondary | ICD-10-CM | POA: Diagnosis not present

## 2017-04-05 NOTE — Progress Notes (Signed)
Daily Session Note  Patient Details  Name: Andrew Williamson MRN: [id number] Date of Birth: 1946/04/03 Referring Provider:     CARDIAC REHAB PHASE II ORIENTATION from 02/05/2017 in [place]  Referring Provider  Candee Furbish MD      Encounter Date: 04/05/2017  Check In:     Session Check In - 04/05/17 0847      Check-In   Location [facility]   Staff Present Su Hilt, MS, ACSM RCEP, Exercise Physiologist;Carlette Wilber Oliphant, RN, BSN;Amber Fair, MS, ACSM RCEP, Exercise Physiologist   Supervising physician immediately available to respond to emergencies [facility] immediately available   Physician(s) Dr. Posey Pronto   Medication changes reported     No   Fall or balance concerns reported    No   Tobacco Cessation No Change   Warm-up and Cool-down Performed as group-led instruction   Resistance Training Performed Yes   VAD Patient? No     Pain Assessment   Currently in Pain? No/denies   Multiple Pain Sites No      Capillary Blood Glucose: No results found for this or any previous visit (from the past 24 hour(s)).    History  Smoking Status  . Never Smoker  Smokeless Tobacco  . Current User  . Types: Chew    Goals Met:  Exercise tolerated well  Goals Unmet:  Not Applicable  Comments: pt arrived at cardiac rehab [facility] Pt denies presence of CP with CR activities today.  Pt has scheduled OV 04/09/2017.     Dr. TFransico Himis [facility] Director [facility]

## 2017-04-08 ENCOUNTER — Encounter (HOSPITAL_COMMUNITY)
Admission: RE | Admit: 2017-04-08 | Discharge: 2017-04-08 | Disposition: A | Discharge status: Home / Self Care | Payer: Medicare Other | Source: Ambulatory Visit | Attending: Cardiology | Admitting: Cardiology

## 2017-04-08 DIAGNOSIS — K219 Gastro-esophageal reflux disease without esophagitis: Secondary | ICD-10-CM | POA: Diagnosis not present

## 2017-04-08 DIAGNOSIS — Z7982 Long term (current) use of aspirin: Secondary | ICD-10-CM | POA: Diagnosis not present

## 2017-04-08 DIAGNOSIS — M160 Bilateral primary osteoarthritis of hip: Secondary | ICD-10-CM | POA: Diagnosis not present

## 2017-04-08 DIAGNOSIS — Z951 Presence of aortocoronary bypass graft: Secondary | ICD-10-CM

## 2017-04-08 DIAGNOSIS — I2510 Atherosclerotic heart disease of native coronary artery without angina pectoris: Secondary | ICD-10-CM | POA: Diagnosis not present

## 2017-04-08 DIAGNOSIS — E119 Type 2 diabetes mellitus without complications: Secondary | ICD-10-CM | POA: Diagnosis not present

## 2017-04-09 ENCOUNTER — Ambulatory Visit (INDEPENDENT_AMBULATORY_CARE_PROVIDER_SITE_OTHER): Payer: Medicare Other | Admitting: Physician Assistant

## 2017-04-09 ENCOUNTER — Encounter: Payer: Self-pay | Admitting: Physician Assistant

## 2017-04-09 VITALS — BP 120/60 | HR 71 | Ht 69.0 in | Wt 207.8 lb

## 2017-04-09 DIAGNOSIS — E7800 Pure hypercholesterolemia, unspecified: Secondary | ICD-10-CM

## 2017-04-09 DIAGNOSIS — I10 Essential (primary) hypertension: Secondary | ICD-10-CM

## 2017-04-09 DIAGNOSIS — I2510 Atherosclerotic heart disease of native coronary artery without angina pectoris: Secondary | ICD-10-CM

## 2017-04-09 DIAGNOSIS — I209 Angina pectoris, unspecified: Secondary | ICD-10-CM

## 2017-04-09 DIAGNOSIS — I25119 Atherosclerotic heart disease of native coronary artery with unspecified angina pectoris: Secondary | ICD-10-CM | POA: Diagnosis not present

## 2017-04-09 MED ORDER — ASPIRIN EC 81 MG PO TBEC: 81.0000 mg | DELAYED_RELEASE_TABLET | Freq: Every day | ORAL | Status: DC

## 2017-04-09 MED ORDER — METOPROLOL TARTRATE 50 MG PO TABS: 75.0000 mg | ORAL_TABLET | Freq: Two times a day (BID) | ORAL | 3 refills | Status: DC

## 2017-04-09 NOTE — Patient Instructions (Addendum)
Medication Instructions:  1. INCREASE METOPROLOL TARTRATE TO 75 MG TWICE DAILY (THIS WILL BE 1 AND 1/2 TABS TWICE DAILY); NEW RX HAS BEEN SENT IN  2. OK TO CHANGE ASPIRIN OVER THE 81 MG ONCE A DAY Labwork: NONE ORDERED  Testing/Procedures: NONE ORDERED  Follow-Up: DR. Marlou Porch OR SCOTT WEAVER, PAC IN 4-6 WEEKS; IF SCOTT WEAVER, PAC DO THE SAME DAY DR. Marlou Porch IS IN THE OFFICE IF POSSIBLE  Any Other Special Instructions Will Be Listed Below (If Applicable).  If you need a refill on your cardiac medications before your next appointment, please call your pharmacy.

## 2017-04-09 NOTE — Progress Notes (Signed)
Cardiology Office Note:    Date:  04/09/2017   ID:  Andrew Williamson, DOB 1946/12/07, MRN [id number]  PCP:  Andrew Amel, MD  Cardiologist:  Andrew Williamson   Electrophysiologist:  n/a Urologist: Andrew Williamson  Referring MD: Andrew Amel, MD   Chief Complaint  Patient presents with  . Chest Pain    History of Present Illness:    Andrew Williamson is a 71 y.o. male with a hx of CAD s/p CABG in 12/2016 (Dr. Servando Williamson L-LAD, S-RI, S-LCx), HTN, DM2, HL, prostate CA s/p racial prostatectomy in 9/16.  He did have post op AF after his CABG controlled with Amiodarone.    He was evaluated by Andrew Kicks, NP 03/25/17 for exertional chest pain while on the stationary bike at cardiac rehabilitation.  Myoview demonstrated inferolateral ischemia.  Andrew Williamson and Andrew Williamson discussed the case and started Isosorbide.  We were contacted by cardiac rehabilitation 4/25.  Andrew Williamson was still having discomfort with exercise and we increased his beta-blocker.  He returns for follow up.    He is here alone. He still has some discomfort in his epigastrium after eating that is relieved by belching. He has had one other episode of discomfort in his chest with exercise. This was at the end of exercise on the treadmill. Otherwise, he has been able to exercise without chest discomfort since his antianginal regimen was adjusted. He denies significant dyspnea. He denies orthopnea, PND or edema. He denies syncope.  Prior CV studies:   The following studies were reviewed today:  Myoview 03/28/17 EF 59, inf-lat ischemia, 1 mm J point depression >> down-sloping at peak exercise; High Risk  Carotid US 12/17/16 Bilateral: intimal wall thickening CCA. 1-39% ICA plaquing.   LHC 12/05/16 >> CABG LM ostial 50 LAD ostial 40, distal 50 RI 25 LCx ostial 95, mid 95 RCA luminal irregs EF 55-65   Echo 12/17:  EF 55-60, normal wall motion, grade 1 diastolic dysfunction   Myoview 12/17: EF 55, inf-lateral ischemia;  intermediate risk   Past Medical History:  Diagnosis Date  . Allergic rhinitis   . Coronary artery disease involving native coronary artery of native heart without angina pectoris 01/07/2017   a - Myoview 12/17:  EF 55, inf-lateral ischemia; intermediate risk // b - LHC 12/05/16: oLM 50, oLAD 40, dLAD 50, RI 25, oLCx 95, mLCx 95, RCA luminal irregs // c - Echo 12/17: EF 55-60, no RWMA, Gr 1 DD // d. 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 DJD (degenerative joint disease)    hips  . ED (erectile dysfunction)   . GERD (gastroesophageal reflux disease)   . Gynecomastia, male   . History of echocardiogram    Echo 12/17: EF 55-60, normal wall motion, grade 1 diastolic dysfunction  . History of nuclear stress test    Myoview 12/17: EF 55, inf-lateral ischemia; intermediate risk  . HLD (hyperlipidemia)    hx of ? elevated LFTs on statin Rx  . Hypertension   . Obesity   . Prostate cancer Andrew Williamson)    s/p radical prostatectomy  . Spinal stenosis     Past Surgical History:  Procedure Laterality Date  . CARDIAC CATHETERIZATION N/A 12/05/2016   Procedure: Left Heart Cath and Coronary Angiography;  Surgeon: Andrew Booze, MD;  Location: [place] [facility];  Service: Cardiovascular;  Laterality: N/A;  . CARDIAC CATHETERIZATION N/A 12/05/2016   Procedure: Intravascular  Ultrasound/IVUS;  Surgeon: Andrew Booze, MD;  Location: [place] [facility];  Service: Cardiovascular;  Laterality: N/A;  . COLONOSCOPY  06/2014   FU 10 years  . CORONARY ARTERY BYPASS GRAFT N/A 12/19/2016   Procedure: CORONARY ARTERY BYPASS GRAFTING (CABG) x three , using left internal mammary artery and right leg greater saphenous vein harvested endoscopically; SVG to Ramus, SVG to circ, LIMA to LAD ;  Surgeon: Andrew Isaac, MD;  Location: [place];  Service: Open Heart Surgery;  Laterality: N/A;  . LAPAROSCOPIC CHOLECYSTECTOMY     2012  . ROBOT  ASSISTED LAPAROSCOPIC RADICAL PROSTATECTOMY N/A 08/11/2015   Procedure: ROBOTIC ASSISTED LAPAROSCOPIC RADICAL PROSTATECTOMY;  Surgeon: Andrew Seal, MD;  Location: [facility];  Service: Urology;  Laterality: N/A;  . TEE WITHOUT CARDIOVERSION N/A 12/19/2016   Procedure: TRANSESOPHAGEAL ECHOCARDIOGRAM (TEE);  Surgeon: Andrew Isaac, MD;  Location: [place];  Service: Open Heart Surgery;  Laterality: N/A;    Current Medications: Current Meds  Medication Sig  . gabapentin (NEURONTIN) 300 MG capsule Take 300 mg by mouth 3 (three) times daily.   . isosorbide mononitrate (IMDUR) 30 MG 24 hr tablet Take 1 tablet (30 mg total) by mouth daily.  Marland Kitchen lisinopril (PRINIVIL,ZESTRIL) 5 MG tablet Take 1 tablet (5 mg total) by mouth daily.  . metFORMIN (GLUCOPHAGE) 1000 MG tablet Take 1,000 mg by mouth 2 (two) times daily with a meal.  . Multiple Vitamins-Minerals (CENTRUM SILVER PO) Take 1 tablet by mouth daily.  Marland Kitchen omeprazole (PRILOSEC) 40 MG capsule TAKE ONE CAPSULE EVERY DAY ONCE A DAY ORALLY 30 DAYS  . rosuvastatin (CRESTOR) 20 MG tablet Take 20 mg by mouth daily. 1/2 tab daily  . [DISCONTINUED] aspirin EC 325 MG tablet Take 1 tablet (325 mg total) by mouth daily.  . [DISCONTINUED] metoprolol (LOPRESSOR) 50 MG tablet Take 1 tablet (50 mg total) by mouth 2 (two) times daily.     Allergies:   Thimerosal   Social History   Social History  . Marital status: Widowed    Spouse name: N/A  . Number of children: N/A  . Years of education: N/A   Social History Main Topics  . Smoking status: Never Smoker  . Smokeless tobacco: Current User    Types: Chew  . Alcohol use Yes     Comment: rarely  . Drug use: No  . Sexual activity: Not Asked   Other Topics Concern  . 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 Heart attack Mother 51  . Heart failure Father 81    cardiac arrest during surgery >> CHF  . Peripheral Artery Disease Father   . Heart attack Maternal Uncle      ROS:   Please see the history of present illness.    Review of Systems  Cardiovascular: Positive for chest pain.  Musculoskeletal: Positive for back pain and joint pain.  Gastrointestinal: Positive for diarrhea.   All other systems reviewed and are negative.   EKGs/Labs/Other Test Reviewed:    EKG:  EKG is ordered today.  The ekg ordered today demonstrates NSR, HR 71, normal axis, QTc 436 ms, no change from prior tracing.  Recent Labs: 12/17/2016: ALT 19 12/20/2016: Magnesium 2.3 12/22/2016: Hemoglobin 12.6; Platelets 151 12/24/2016: BUN 21; Creatinine, Ser 1.09; Potassium 3.8; Sodium 139   Recent Lipid Panel No results found for: CHOL, TRIG, HDL, CHOLHDL, VLDL, LDLCALC, LDLDIRECT   Physical Exam:    VS:  BP 120/60   Pulse 71   Ht 5\' 9"  (1.753 m)   Wt 207 lb 12.8 oz (94.3 kg)   SpO2 99%   BMI 30.69 kg/m     Wt Readings from Last 3 Encounters:  04/09/17 207 lb 12.8 oz (94.3 kg)  03/28/17 209 lb (94.8 kg)  03/25/17 209 lb 6.6 oz (95 kg)     Physical Exam  Constitutional: He is oriented to person, place, and time. He appears well-developed and well-nourished. No distress.  HENT:  Head: Normocephalic and atraumatic.  Eyes: No scleral icterus.  Neck: Normal range of motion. No JVD present.  Cardiovascular: Normal rate, regular rhythm, S1 normal and S2 normal.   No murmur heard. Pulmonary/Chest: Effort normal and breath sounds normal. He has no wheezes. He has no rhonchi. He has no rales.  Abdominal: Soft. There is no tenderness.  Musculoskeletal: He exhibits no edema.  Neurological: He is alert and oriented to person, place, and time.  Skin: Skin is warm and dry.  Psychiatric: He has a normal mood and affect.    ASSESSMENT:    1. Coronary artery  disease involving native coronary artery of native heart with angina pectoris ([place])   2. Essential hypertension   3. Pure hypercholesterolemia    PLAN:    In order of problems listed above:  1. Coronary artery disease involving native coronary artery of native heart with angina pectoris ([place]) -  s/p CABG in 12/2016.  Recent Myoview with a small are of inferolateral ischemia.  I suspect his SVG to the small LCx has failed.  His symptoms are currently controlled on current medical Rx with infrequent chest pain. He is experiencing CCS class 1-2 angina.  We had a long discussion regarding continued medical Rx vs cardia cath.  We briefly dicussed the results of the COURAGE trial.  At this point, I would recommend continued medical Rx unless his symptoms progress.  I d/w Andrew Williamson who agreed.  -  Continue ASA, statin, Imdur  -  Increase Metoprolol to 75 bid  -  FU in 6 weeks.  -  If symptoms progress, will need to consider proceeding with cardiac cath.   2. Essential hypertension - BP controlled.   3. Pure hypercholesterolemia - Continue statin.    Dispo:  Return in about 6 weeks (around 05/21/2017) for Close Follow Up, w/ [name], PA-C or Andrew Williamson .   Medication Adjustments/Labs and Tests Ordered: Current medicines are reviewed at length with the patient today.  Concerns regarding medicines are outlined above.  Orders placed this visit:  Orders Placed This Encounter  Procedures  . EKG 12-Lead   Medication changes this visit: Meds ordered this encounter  Medications  . metoprolol (LOPRESSOR) 50 MG tablet    Sig: Take 1.5 tablets (75 mg total) by mouth 2 (two) times daily.    Dispense:  270 tablet    Refill:  3  . aspirin EC 81 MG tablet    Sig: Take 1 tablet (81 mg total) by mouth daily.    Signed, [name], PA-C  04/09/2017 1:14 PM    [facility] [address], [place], [place]  [address] Phone: [phone];  Fax: [phone]

## 2017-04-10 ENCOUNTER — Encounter (HOSPITAL_COMMUNITY)
Admission: RE | Admit: 2017-04-10 | Discharge: 2017-04-10 | Disposition: A | Discharge status: Home / Self Care | Payer: Medicare Other | Source: Ambulatory Visit | Attending: Cardiology | Admitting: Cardiology

## 2017-04-10 DIAGNOSIS — I10 Essential (primary) hypertension: Secondary | ICD-10-CM | POA: Insufficient documentation

## 2017-04-10 DIAGNOSIS — Z7982 Long term (current) use of aspirin: Secondary | ICD-10-CM | POA: Insufficient documentation

## 2017-04-10 DIAGNOSIS — M160 Bilateral primary osteoarthritis of hip: Secondary | ICD-10-CM | POA: Diagnosis not present

## 2017-04-10 DIAGNOSIS — I2510 Atherosclerotic heart disease of native coronary artery without angina pectoris: Secondary | ICD-10-CM | POA: Insufficient documentation

## 2017-04-10 DIAGNOSIS — Z951 Presence of aortocoronary bypass graft: Secondary | ICD-10-CM | POA: Diagnosis not present

## 2017-04-10 DIAGNOSIS — K219 Gastro-esophageal reflux disease without esophagitis: Secondary | ICD-10-CM | POA: Diagnosis not present

## 2017-04-10 DIAGNOSIS — E669 Obesity, unspecified: Secondary | ICD-10-CM | POA: Diagnosis not present

## 2017-04-10 DIAGNOSIS — E119 Type 2 diabetes mellitus without complications: Secondary | ICD-10-CM | POA: Insufficient documentation

## 2017-04-10 DIAGNOSIS — N529 Male erectile dysfunction, unspecified: Secondary | ICD-10-CM | POA: Insufficient documentation

## 2017-04-10 DIAGNOSIS — E785 Hyperlipidemia, unspecified: Secondary | ICD-10-CM | POA: Diagnosis not present

## 2017-04-10 DIAGNOSIS — Z8546 Personal history of malignant neoplasm of prostate: Secondary | ICD-10-CM | POA: Diagnosis not present

## 2017-04-10 DIAGNOSIS — N62 Hypertrophy of breast: Secondary | ICD-10-CM | POA: Diagnosis not present

## 2017-04-12 ENCOUNTER — Encounter (HOSPITAL_COMMUNITY)
Admission: RE | Admit: 2017-04-12 | Discharge: 2017-04-12 | Disposition: A | Discharge status: Home / Self Care | Payer: Medicare Other | Source: Ambulatory Visit | Attending: Cardiology | Admitting: Cardiology

## 2017-04-12 DIAGNOSIS — Z951 Presence of aortocoronary bypass graft: Secondary | ICD-10-CM

## 2017-04-12 DIAGNOSIS — K219 Gastro-esophageal reflux disease without esophagitis: Secondary | ICD-10-CM | POA: Diagnosis not present

## 2017-04-12 DIAGNOSIS — Z7982 Long term (current) use of aspirin: Secondary | ICD-10-CM | POA: Diagnosis not present

## 2017-04-12 DIAGNOSIS — I2510 Atherosclerotic heart disease of native coronary artery without angina pectoris: Secondary | ICD-10-CM | POA: Diagnosis not present

## 2017-04-12 DIAGNOSIS — E119 Type 2 diabetes mellitus without complications: Secondary | ICD-10-CM | POA: Diagnosis not present

## 2017-04-12 DIAGNOSIS — M160 Bilateral primary osteoarthritis of hip: Secondary | ICD-10-CM | POA: Diagnosis not present

## 2017-04-15 ENCOUNTER — Encounter (HOSPITAL_COMMUNITY)
Admission: RE | Admit: 2017-04-15 | Discharge: 2017-04-15 | Disposition: A | Discharge status: Home / Self Care | Payer: Medicare Other | Source: Ambulatory Visit | Attending: Cardiology | Admitting: Cardiology

## 2017-04-15 DIAGNOSIS — M160 Bilateral primary osteoarthritis of hip: Secondary | ICD-10-CM | POA: Diagnosis not present

## 2017-04-15 DIAGNOSIS — K219 Gastro-esophageal reflux disease without esophagitis: Secondary | ICD-10-CM | POA: Diagnosis not present

## 2017-04-15 DIAGNOSIS — Z7982 Long term (current) use of aspirin: Secondary | ICD-10-CM | POA: Diagnosis not present

## 2017-04-15 DIAGNOSIS — I2510 Atherosclerotic heart disease of native coronary artery without angina pectoris: Secondary | ICD-10-CM | POA: Diagnosis not present

## 2017-04-15 DIAGNOSIS — Z951 Presence of aortocoronary bypass graft: Secondary | ICD-10-CM

## 2017-04-15 DIAGNOSIS — E119 Type 2 diabetes mellitus without complications: Secondary | ICD-10-CM | POA: Diagnosis not present

## 2017-04-17 ENCOUNTER — Encounter (HOSPITAL_COMMUNITY)
Admission: RE | Admit: 2017-04-17 | Discharge: 2017-04-17 | Disposition: A | Discharge status: Home / Self Care | Payer: Medicare Other | Source: Ambulatory Visit | Attending: Cardiology | Admitting: Cardiology

## 2017-04-17 DIAGNOSIS — M160 Bilateral primary osteoarthritis of hip: Secondary | ICD-10-CM | POA: Diagnosis not present

## 2017-04-17 DIAGNOSIS — Z951 Presence of aortocoronary bypass graft: Secondary | ICD-10-CM | POA: Diagnosis not present

## 2017-04-17 DIAGNOSIS — E119 Type 2 diabetes mellitus without complications: Secondary | ICD-10-CM | POA: Diagnosis not present

## 2017-04-17 DIAGNOSIS — K219 Gastro-esophageal reflux disease without esophagitis: Secondary | ICD-10-CM | POA: Diagnosis not present

## 2017-04-17 DIAGNOSIS — Z7982 Long term (current) use of aspirin: Secondary | ICD-10-CM | POA: Diagnosis not present

## 2017-04-17 DIAGNOSIS — I2510 Atherosclerotic heart disease of native coronary artery without angina pectoris: Secondary | ICD-10-CM | POA: Diagnosis not present

## 2017-04-18 ENCOUNTER — Encounter: Payer: Self-pay | Admitting: Cardiology

## 2017-04-19 ENCOUNTER — Encounter (HOSPITAL_COMMUNITY)
Admission: RE | Admit: 2017-04-19 | Discharge: 2017-04-19 | Disposition: A | Discharge status: Home / Self Care | Payer: Medicare Other | Source: Ambulatory Visit | Attending: Cardiology | Admitting: Cardiology

## 2017-04-19 VITALS — Ht 69.0 in | Wt 207.5 lb

## 2017-04-19 DIAGNOSIS — K219 Gastro-esophageal reflux disease without esophagitis: Secondary | ICD-10-CM | POA: Diagnosis not present

## 2017-04-19 DIAGNOSIS — Z951 Presence of aortocoronary bypass graft: Secondary | ICD-10-CM | POA: Diagnosis not present

## 2017-04-19 DIAGNOSIS — E119 Type 2 diabetes mellitus without complications: Secondary | ICD-10-CM | POA: Diagnosis not present

## 2017-04-19 DIAGNOSIS — I2510 Atherosclerotic heart disease of native coronary artery without angina pectoris: Secondary | ICD-10-CM | POA: Diagnosis not present

## 2017-04-19 DIAGNOSIS — Z7982 Long term (current) use of aspirin: Secondary | ICD-10-CM | POA: Diagnosis not present

## 2017-04-19 DIAGNOSIS — M160 Bilateral primary osteoarthritis of hip: Secondary | ICD-10-CM | POA: Diagnosis not present

## 2017-04-22 ENCOUNTER — Encounter (HOSPITAL_COMMUNITY)
Admission: RE | Admit: 2017-04-22 | Discharge: 2017-04-22 | Disposition: A | Discharge status: Home / Self Care | Payer: Medicare Other | Source: Ambulatory Visit | Attending: Cardiology | Admitting: Cardiology

## 2017-04-22 DIAGNOSIS — Z7982 Long term (current) use of aspirin: Secondary | ICD-10-CM | POA: Diagnosis not present

## 2017-04-22 DIAGNOSIS — Z951 Presence of aortocoronary bypass graft: Secondary | ICD-10-CM | POA: Diagnosis not present

## 2017-04-22 DIAGNOSIS — M160 Bilateral primary osteoarthritis of hip: Secondary | ICD-10-CM | POA: Diagnosis not present

## 2017-04-22 DIAGNOSIS — E119 Type 2 diabetes mellitus without complications: Secondary | ICD-10-CM | POA: Diagnosis not present

## 2017-04-22 DIAGNOSIS — K219 Gastro-esophageal reflux disease without esophagitis: Secondary | ICD-10-CM | POA: Diagnosis not present

## 2017-04-22 DIAGNOSIS — I2510 Atherosclerotic heart disease of native coronary artery without angina pectoris: Secondary | ICD-10-CM | POA: Diagnosis not present

## 2017-04-24 ENCOUNTER — Encounter (HOSPITAL_COMMUNITY)
Admission: RE | Admit: 2017-04-24 | Discharge: 2017-04-24 | Disposition: A | Discharge status: Home / Self Care | Payer: Medicare Other | Source: Ambulatory Visit | Attending: Cardiology | Admitting: Cardiology

## 2017-04-24 DIAGNOSIS — Z951 Presence of aortocoronary bypass graft: Secondary | ICD-10-CM | POA: Diagnosis not present

## 2017-04-24 DIAGNOSIS — E119 Type 2 diabetes mellitus without complications: Secondary | ICD-10-CM | POA: Diagnosis not present

## 2017-04-24 DIAGNOSIS — M160 Bilateral primary osteoarthritis of hip: Secondary | ICD-10-CM | POA: Diagnosis not present

## 2017-04-24 DIAGNOSIS — K219 Gastro-esophageal reflux disease without esophagitis: Secondary | ICD-10-CM | POA: Diagnosis not present

## 2017-04-24 DIAGNOSIS — Z7982 Long term (current) use of aspirin: Secondary | ICD-10-CM | POA: Diagnosis not present

## 2017-04-24 DIAGNOSIS — I2510 Atherosclerotic heart disease of native coronary artery without angina pectoris: Secondary | ICD-10-CM | POA: Diagnosis not present

## 2017-04-25 DIAGNOSIS — H01024 Squamous blepharitis left upper eyelid: Secondary | ICD-10-CM | POA: Diagnosis not present

## 2017-04-25 DIAGNOSIS — H2513 Age-related nuclear cataract, bilateral: Secondary | ICD-10-CM | POA: Diagnosis not present

## 2017-04-25 DIAGNOSIS — E119 Type 2 diabetes mellitus without complications: Secondary | ICD-10-CM | POA: Diagnosis not present

## 2017-04-25 DIAGNOSIS — H01021 Squamous blepharitis right upper eyelid: Secondary | ICD-10-CM | POA: Diagnosis not present

## 2017-04-26 ENCOUNTER — Encounter (HOSPITAL_COMMUNITY)
Admission: RE | Admit: 2017-04-26 | Discharge: 2017-04-26 | Disposition: A | Discharge status: Home / Self Care | Payer: Medicare Other | Source: Ambulatory Visit | Attending: Cardiology | Admitting: Cardiology

## 2017-04-26 DIAGNOSIS — Z7982 Long term (current) use of aspirin: Secondary | ICD-10-CM | POA: Diagnosis not present

## 2017-04-26 DIAGNOSIS — Z951 Presence of aortocoronary bypass graft: Secondary | ICD-10-CM

## 2017-04-26 DIAGNOSIS — E119 Type 2 diabetes mellitus without complications: Secondary | ICD-10-CM | POA: Diagnosis not present

## 2017-04-26 DIAGNOSIS — M160 Bilateral primary osteoarthritis of hip: Secondary | ICD-10-CM | POA: Diagnosis not present

## 2017-04-26 DIAGNOSIS — K219 Gastro-esophageal reflux disease without esophagitis: Secondary | ICD-10-CM | POA: Diagnosis not present

## 2017-04-26 DIAGNOSIS — I2510 Atherosclerotic heart disease of native coronary artery without angina pectoris: Secondary | ICD-10-CM | POA: Diagnosis not present

## 2017-04-29 ENCOUNTER — Encounter (HOSPITAL_COMMUNITY)
Admission: RE | Admit: 2017-04-29 | Discharge: 2017-04-29 | Disposition: A | Discharge status: Home / Self Care | Payer: Medicare Other | Source: Ambulatory Visit | Attending: Cardiology | Admitting: Cardiology

## 2017-04-29 DIAGNOSIS — Z951 Presence of aortocoronary bypass graft: Secondary | ICD-10-CM

## 2017-04-29 DIAGNOSIS — M160 Bilateral primary osteoarthritis of hip: Secondary | ICD-10-CM | POA: Diagnosis not present

## 2017-04-29 DIAGNOSIS — Z7982 Long term (current) use of aspirin: Secondary | ICD-10-CM | POA: Diagnosis not present

## 2017-04-29 DIAGNOSIS — I2510 Atherosclerotic heart disease of native coronary artery without angina pectoris: Secondary | ICD-10-CM | POA: Diagnosis not present

## 2017-04-29 DIAGNOSIS — K219 Gastro-esophageal reflux disease without esophagitis: Secondary | ICD-10-CM | POA: Diagnosis not present

## 2017-04-29 DIAGNOSIS — E119 Type 2 diabetes mellitus without complications: Secondary | ICD-10-CM | POA: Diagnosis not present

## 2017-05-01 ENCOUNTER — Encounter (HOSPITAL_COMMUNITY)
Admission: RE | Admit: 2017-05-01 | Discharge: 2017-05-01 | Disposition: A | Discharge status: Home / Self Care | Payer: Medicare Other | Source: Ambulatory Visit | Attending: Cardiology | Admitting: Cardiology

## 2017-05-01 ENCOUNTER — Encounter (HOSPITAL_COMMUNITY): Payer: Self-pay

## 2017-05-01 DIAGNOSIS — Z951 Presence of aortocoronary bypass graft: Secondary | ICD-10-CM

## 2017-05-01 DIAGNOSIS — Z7982 Long term (current) use of aspirin: Secondary | ICD-10-CM | POA: Diagnosis not present

## 2017-05-01 DIAGNOSIS — K219 Gastro-esophageal reflux disease without esophagitis: Secondary | ICD-10-CM | POA: Diagnosis not present

## 2017-05-01 DIAGNOSIS — M160 Bilateral primary osteoarthritis of hip: Secondary | ICD-10-CM | POA: Diagnosis not present

## 2017-05-01 DIAGNOSIS — E119 Type 2 diabetes mellitus without complications: Secondary | ICD-10-CM | POA: Diagnosis not present

## 2017-05-01 DIAGNOSIS — I2510 Atherosclerotic heart disease of native coronary artery without angina pectoris: Secondary | ICD-10-CM | POA: Diagnosis not present

## 2017-05-01 NOTE — Progress Notes (Signed)
Cardiac Individual Treatment Plan  Patient Details  Name: Andrew Williamson MRN: [id number] Date of Birth: [birth year] Referring Provider:     [place] from 02/05/2017 in [place]  Referring Provider  Candee Furbish MD      Initial Encounter Date:    CARDIAC REHAB PHASE II ORIENTATION from 02/05/2017 in [place]  Date  02/05/17  Referring Provider  Candee Furbish MD      Visit Diagnosis: 12/19/16 S/P CABG x 3  Patient's Home Medications on Admission:  Current Outpatient Prescriptions:  .  aspirin EC 81 MG tablet, Take 1 tablet (81 mg total) by mouth daily., Disp: , Rfl:  .  gabapentin (NEURONTIN) 300 MG capsule, Take 300 mg by mouth 3 (three) times daily. , Disp: , Rfl:  .  isosorbide mononitrate (IMDUR) 30 MG 24 hr tablet, Take 1 tablet (30 mg total) by mouth daily., Disp: 90 tablet, Rfl: 3 .  lisinopril (PRINIVIL,ZESTRIL) 5 MG tablet, Take 1 tablet (5 mg total) by mouth daily., Disp: 30 tablet, Rfl: 1 .  metFORMIN (GLUCOPHAGE) 1000 MG tablet, Take 1,000 mg by mouth 2 (two) times daily with a meal., Disp: , Rfl:  .  metoprolol (LOPRESSOR) 50 MG tablet, Take 1.5 tablets (75 mg total) by mouth 2 (two) times daily., Disp: 270 tablet, Rfl: 3 .  Multiple Vitamins-Minerals (CENTRUM SILVER PO), Take 1 tablet by mouth daily., Disp: , Rfl:  .  omeprazole (PRILOSEC) 40 MG capsule, TAKE ONE CAPSULE EVERY DAY ONCE A DAY ORALLY 30 DAYS, Disp: , Rfl: 3 .  rosuvastatin (CRESTOR) 20 MG tablet, Take 20 mg by mouth daily. 1/2 tab daily, Disp: , Rfl:   Past Medical History: Past Medical History:  Diagnosis Date  . Allergic rhinitis   . Coronary artery disease involving native coronary artery of native heart without angina pectoris 01/07/2017   a - Myoview 12/17:  EF 55, inf-lateral ischemia; intermediate risk // b - LHC 12/05/16: oLM 50, oLAD 40, dLAD 50, RI 25, oLCx 95, mLCx 95, RCA luminal irregs // c - Echo  12/17: EF 55-60, no RWMA, Gr 1 DD // d. 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 DJD (degenerative joint disease)    hips  . ED (erectile dysfunction)   . GERD (gastroesophageal reflux disease)   . Gynecomastia, male   . History of echocardiogram    Echo 12/17: EF 55-60, normal wall motion, grade 1 diastolic dysfunction  . History of nuclear stress test    Myoview 12/17: EF 55, inf-lateral ischemia; intermediate risk  . HLD (hyperlipidemia)    hx of ? elevated LFTs on statin Rx  . Hypertension   . Obesity   . Prostate cancer [facility])    s/p radical prostatectomy  . Spinal stenosis     Tobacco Use: History  Smoking Status  . Never Smoker  Smokeless Tobacco  . Current User  . Types: Chew    Labs: Recent Review Flowsheet Data    Labs for ITP Cardiac and Pulmonary Rehab Latest Ref Rng & Units 12/19/2016 12/19/2016 12/19/2016 12/19/2016 12/20/2016   Hemoglobin A1c 4.8 - 5.6 % - - - - -   PHART 7.350 - 7.450 7.342(L) 7.312(L) - 7.443 -   PCO2ART 32.0 - 48.0 mmHg 40.8 38.4 - 38.6 -   HCO3 20.0 - 28.0 mmol/L 22.6 19.4(L) - 26.2 -   TCO2  0 - 100 mmol/L 24 20 21 27 25    ACIDBASEDEF 0.0 - 2.0 mmol/L 3.0(H) 6.0(H) - - -   O2SAT % 96.0 99.0 - 99.0 -      Capillary Blood Glucose: Lab Results  Component Value Date   GLUCAP 116 (H) 03/13/2017   GLUCAP 102 (H) 03/04/2017   GLUCAP 137 (H) 02/18/2017   GLUCAP 204 (H) 02/15/2017   GLUCAP 98 02/13/2017     Exercise Target Goals:    Exercise Program Goal: Individual exercise prescription set with THRR, safety & activity barriers. Participant demonstrates ability to understand and report RPE using BORG scale, to self-measure pulse accurately, and to acknowledge the importance of the exercise prescription.  Exercise Prescription Goal: Starting with aerobic activity 30 plus minutes a day, 3 days per week for initial exercise prescription. Provide home exercise  prescription and guidelines that participant acknowledges understanding prior to discharge.  Activity Barriers & Risk Stratification:     Activity Barriers & Cardiac Risk Stratification - 02/05/17 1120      Activity Barriers & Cardiac Risk Stratification   Activity Barriers Back Problems   Cardiac Risk Stratification High      6 Minute Walk:     6 Minute Walk    Row Name 02/05/17 1031 04/19/17 0911 04/19/17 1009     6 Minute Walk   Phase Initial Discharge  -   Distance 1400 feet 1600 feet  -   Distance % Change  -  - 14.29 %   Walk Time 6 minutes 6 minutes  -   # of Rest Breaks 0 0  -   MPH 2.65  -  -   METS 3.07  -  -   RPE 10 12  -   VO2 Peak 10.74  - 10.9   Symptoms Yes (comment)  - No   Comments Patient c/o dyspnea.  -  -   Resting HR 71 bpm  - 61 bpm   Resting BP 110/57  - 104/62   Max Ex. HR 102 bpm  - 86 bpm   Max Ex. BP 148/78  - 130/60   2 Minute Post BP 104/60  - 114/70      Oxygen Initial Assessment:   Oxygen Re-Evaluation:   Oxygen Discharge (Final Oxygen Re-Evaluation):   Initial Exercise Prescription:     Initial Exercise Prescription - 02/05/17 1100      Date of Initial Exercise RX and Referring Provider   Date 02/05/17   Referring Provider Candee Furbish MD     Treadmill   MPH 2.3   Grade 1   Minutes 10   METs 3.08     Bike   Level 0.5   Minutes 10   METs 1.99     NuStep   Level 3   SPM 80   Minutes 10   METs 2     Prescription Details   Frequency (times per week) 3   Duration Progress to 30 minutes of continuous aerobic without signs/symptoms of physical distress     Intensity   THRR 40-80% of Max Heartrate 60-120   Ratings of Perceived Exertion 11-13   Perceived Dyspnea 0-4     Progression   Progression Continue to progress workloads to maintain intensity without signs/symptoms of physical distress.     Resistance Training   Training Prescription Yes   Weight 2lbs   Reps 10-15      Perform Capillary Blood  Glucose checks as needed.  Exercise Prescription Changes:  Exercise Prescription Changes    Row Name 02/19/17 1400 03/04/17 1600 03/19/17 1600 04/01/17 1600 04/15/17 1000     Response to Exercise   Blood Pressure (Admit) 104/60 128/62 116/62 104/68 120/70   Blood Pressure (Exercise) 140/70 140/74 134/70 130/80 132/64   Blood Pressure (Exit) 112/60 116/70 122/74 104/60 98/60   Heart Rate (Admit) 75 bpm 82 bpm 80 bpm 66 bpm 62 bpm   Heart Rate (Exercise) 107 bpm 126 bpm 111 bpm 99 bpm 96 bpm   Heart Rate (Exit) 75 bpm 78 bpm 81 bpm 75 bpm 62 bpm   Rating of Perceived Exertion (Exercise) 12 12 12 11 11    Symptoms none none none none none   Comments  -  -  - decreased WL due to c/o CP with exertion pt is tolerating exercises w/o symptoms of CP   Duration Progress to 30 minutes of  aerobic without signs/symptoms of physical distress Progress to 30 minutes of  aerobic without signs/symptoms of physical distress Progress to 30 minutes of  aerobic without signs/symptoms of physical distress Progress to 30 minutes of  aerobic without signs/symptoms of physical distress Progress to 30 minutes of  aerobic without signs/symptoms of physical distress   Intensity THRR unchanged THRR unchanged THRR unchanged THRR unchanged THRR unchanged     Progression   Progression Continue to progress workloads to maintain intensity without signs/symptoms of physical distress. Continue to progress workloads to maintain intensity without signs/symptoms of physical distress. Continue to progress workloads to maintain intensity without signs/symptoms of physical distress. Continue to progress workloads to maintain intensity without signs/symptoms of physical distress. Continue to progress workloads to maintain intensity without signs/symptoms of physical distress.   Average METs 2.1 3.3 3.3 3.1 3.3     Resistance Training   Training Prescription Yes Yes Yes Yes Yes   Weight 2lbs 4lbs 3lbs 3lbs 3lbs   Reps 10-15 10-15  10-15 10-15 10-15   Time 10 Minutes 10 Minutes 10 Minutes 10 Minutes 10 Minutes     Treadmill   MPH 2.3 2.6 2.6 2.6 2.6   Grade 1 2 2  0 0   Minutes 10 10 10 10 10    METs 3.08 3.71 3.71 2.99 2.99     Bike   Level 0.7 1.5 1.5  reduced speed due to chest discomfort and fatigue 1  upright scifit; decr. WL due to chest discomfort 1  upright scifit; decr. WL due to chest discomfort   Minutes 10 10 10 10 10    METs 2.4 3 3 3  3.6     NuStep   Level 3 4 4 4 4    SPM 80 80 80 90 90   Minutes 10 10 10 10 10    METs 3.1 3.4 3.3 3.4 3.2     Home Exercise Plan   Plans to continue exercise at  - Home (comment) Home (comment) Home (comment) Home (comment)   Frequency  - Add 2 additional days to program exercise sessions. Add 2 additional days to program exercise sessions. Add 2 additional days to program exercise sessions. Add 2 additional days to program exercise sessions.   Initial Home Exercises Provided  - 02/25/17 02/25/17 02/25/17 02/25/17      Exercise Comments:     Exercise Comments    Row Name 02/28/17 1554 04/02/17 1349 05/01/17 0954       Exercise Comments Reviewed METs and goals. Pt is tolerating exercise well; will continue to monitor exercise progression Reviewed METs and goals. Pt is tolerating  exercise well; will continue to monitor exercise progression Pt completed 36 sessions of cardiac rehab. Pt plans to attend [employer] in which he will use recumbent bike for 30 minutes and treadmill for exercise 15 minutes, 5-6x/week. Discussed temperature/emergency precautions. Pt voiced understanding.         Exercise Goals and Review:     Exercise Goals    Row Name 02/05/17 0818             Exercise Goals   Increase Physical Activity Yes       Intervention Provide advice, education, support and counseling about physical activity/exercise needs.;Develop an individualized exercise prescription for aerobic and resistive training based on initial evaluation findings, risk  stratification, comorbidities and participant's personal goals.       Expected Outcomes Achievement of increased cardiorespiratory fitness and enhanced flexibility, muscular endurance and strength shown through measurements of functional capacity and personal statement of participant.       Increase Strength and Stamina Yes       Intervention Provide advice, education, support and counseling about physical activity/exercise needs.;Develop an individualized exercise prescription for aerobic and resistive training based on initial evaluation findings, risk stratification, comorbidities and participant's personal goals.       Expected Outcomes Achievement of increased cardiorespiratory fitness and enhanced flexibility, muscular endurance and strength shown through measurements of functional capacity and personal statement of participant.          Exercise Goals Re-Evaluation :     Exercise Goals Re-Evaluation    Row Name 02/25/17 7371 02/28/17 1555 04/02/17 1349 04/24/17 1502       Exercise Goal Re-Evaluation   Exercise Goals Review Increase Physical Activity;Increase Strenth and Stamina Increase Strenth and Stamina;Increase Physical Activity Increase Physical Activity;Increase Strenth and Stamina Increase Physical Activity;Increase Strenth and Stamina    Comments Reviewed home exercise with pt today.  Pt plans to attend [employer] [facility] in which he will use recumbent bike and treadmill for exercise 2-3x/week in addition to coming to cardiac rehab. Reviewed THR, pulse, RPE, sign and symptoms, NTG use, and when to call 911 or MD.  Also discussed weather considerations and indoor options.  Pt voiced understanding. 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  Pt stated feeling stronger and having more energy. However pt has c/o CP with exertion (bike/TM). Modified exercise equipment and pt f/u with cardiologist. Decreased WL and pt reported no CP at this time.  Pt reported having no CP with  activity/exercise. Pt goes to the gym 3x/week and uses stationary bike for 30 minutes and walks on the treadmill for 15 minutes    Expected Outcomes Pt will be complaint with HEP and improve in cardiorespiratory fitness Pt will continue to improve in cardiorespiratory fitness Pt will continue to improve in cardiorespiratory fitness without s/s of CP or anginal-like symptoms Pt will continue to improve in cardiorespiratory fitness without s/s of CP or anginal-like symptoms        Discharge Exercise Prescription (Final Exercise Prescription Changes):     Exercise Prescription Changes - 04/15/17 1000      Response to Exercise   Blood Pressure (Admit) 120/70   Blood Pressure (Exercise) 132/64   Blood Pressure (Exit) 98/60   Heart Rate (Admit) 62 bpm   Heart Rate (Exercise) 96 bpm   Heart Rate (Exit) 62 bpm   Rating of Perceived Exertion (Exercise) 11   Symptoms none   Comments pt is tolerating exercises w/o symptoms of CP   Duration Progress  to 30 minutes of  aerobic without signs/symptoms of physical distress   Intensity THRR unchanged     Progression   Progression Continue to progress workloads to maintain intensity without signs/symptoms of physical distress.   Average METs 3.3     Resistance Training   Training Prescription Yes   Weight 3lbs   Reps 10-15   Time 10 Minutes     Treadmill   MPH 2.6   Grade 0   Minutes 10   METs 2.99     Bike   Level 1  upright scifit; decr. WL due to chest discomfort   Minutes 10   METs 3.6     NuStep   Level 4   SPM 90   Minutes 10   METs 3.2     Home Exercise Plan   Plans to continue exercise at Home (comment)   Frequency Add 2 additional days to program exercise sessions.   Initial Home Exercises Provided 02/25/17      Nutrition:  Target Goals: Understanding of nutrition guidelines, daily intake of sodium 1500mg , cholesterol 200mg , calories 30% from fat and 7% or less from saturated fats, daily to have 5 or more servings  of fruits and vegetables.  Biometrics:     Pre Biometrics - 02/05/17 1034      Pre Biometrics   Height 5\' 9"  (1.753 m)   Weight 207 lb 0.2 oz (93.9 kg)   Waist Circumference 41.5 inches   Hip Circumference 44.5 inches   Waist to Hip Ratio 0.93 %   BMI (Calculated) 30.6   Triceps Skinfold 35 mm   % Body Fat 32.8 %   Grip Strength 45 kg   Flexibility 8 in   Single Leg Stand 9.4 seconds         Post Biometrics - 04/19/17 1012       Post  Biometrics   Height 5\' 9"  (1.753 m)   Weight 207 lb 7.3 oz (94.1 kg)   BMI (Calculated) 30.7   % Body Fat 32.8 %   Grip Strength 45 kg      Nutrition Therapy Plan and Nutrition Goals:     Nutrition Therapy & Goals - 03/01/17 0944      Nutrition Therapy   Diet Carb Modified, Therapeutic Lifestyle Changes     Personal Nutrition Goals   Nutrition Goal Wt loss of 1-2 lb/week to a wt loss goal of 6-24 lb at graduation from Cardiac Rehab   Personal Goal #2 Pt to identify and limit sources of saturated fat, trans fat, and sodium.    Personal Goal #3 Pt to continue to have CBG concentrations in the normal range or as close to normal as is safely possible.     Intervention Plan   Intervention Prescribe, educate and counsel regarding individualized specific dietary modifications aiming towards targeted core components such as weight, hypertension, lipid management, diabetes, heart failure and other comorbidities.   Expected Outcomes Short Term Goal: Understand basic principles of dietary content, such as calories, fat, sodium, cholesterol and nutrients.;Long Term Goal: Adherence to prescribed nutrition plan.      Nutrition Discharge: Nutrition Scores:     Nutrition Assessments - 03/01/17 0945      MEDFICTS Scores   Pre Score 65      Nutrition Goals Re-Evaluation:   Nutrition Goals Re-Evaluation:   Nutrition Goals Discharge (Final Nutrition Goals Re-Evaluation):   Psychosocial: Target Goals: Acknowledge presence or absence  of significant depression and/or stress, maximize coping skills, provide  positive support system. Participant is able to verbalize types and ability to use techniques and skills needed for reducing stress and depression.  Initial Review & Psychosocial Screening:     Initial Psych Review & Screening - 02/11/17 1706      Initial Review   Current issues with None Identified     Family Dynamics   Good Support System? Yes   Comments Upon brief assessment, no psychosocial needs identified, no interventions necessary      Barriers   Psychosocial barriers to participate in program There are no identifiable barriers or psychosocial needs.     Screening Interventions   Interventions Encouraged to exercise;Provide feedback about the scores to participant      Quality of Life Scores:     Quality of Life - 04/23/17 1647      Quality of Life Scores   Health/Function Pre 26.93 %   Health/Function Post 24 %   Health/Function % Change -10.88 %   Socioeconomic Pre 28.07 %   Socioeconomic Post 27.36 %   Socioeconomic % Change  -2.53 %   Psych/Spiritual Pre 28.18 %   Psych/Spiritual Post 25.5 %   Psych/Spiritual % Change -9.51 %   Family Pre 28.5 %   Family Post 28.5 %   Family % Change 0 %   GLOBAL Pre 27.65 %   GLOBAL Post 25.58 %   GLOBAL % Change -7.49 %      PHQ-9: Recent Review Flowsheet Data    Depression screen [facility] 2/9 05/01/2017 02/11/2017   Decreased Interest 0 0   Down, Depressed, Hopeless 0 0   PHQ - 2 Score 0 0     Interpretation of Total Score  Total Score Depression Severity:  1-4 = Minimal depression, 5-9 = Mild depression, 10-14 = Moderate depression, 15-19 = Moderately severe depression, 20-27 = Severe depression   Psychosocial Evaluation and Intervention:     Psychosocial Evaluation - 05/01/17 1113      Discharge Psychosocial Assessment & Intervention   Comments pt exhibits positive outlook with good coping skills. pt plans to continue exercising on his own  at home, going to gym and square dancing.  pt is feeling more assured with his health with symptom management.        Psychosocial Re-Evaluation:     Psychosocial Re-Evaluation    Row Name 03/05/17 1641 04/02/17 0716           Psychosocial Re-Evaluation   Current issues with None Identified  -      Comments no psychosocial needs identifed, no interventions necessary. pt is looking forward to being able to resume usual activities of golf and square dancing.  no psychosocial needs identifed, no interventions necessary. pt has resumed square dancing.        Expected Outcomes pt will deomonstrate positive outlook with good coping skills.   pt will deomonstrate positive outlook with good coping skills.        Interventions Encouraged to attend Cardiac Rehabilitation for the exercise Encouraged to attend Cardiac Rehabilitation for the exercise      Continue Psychosocial Services  No Follow up required No Follow up required         Psychosocial Discharge (Final Psychosocial Re-Evaluation):     Psychosocial Re-Evaluation - 04/02/17 0716      Psychosocial Re-Evaluation   Comments no psychosocial needs identifed, no interventions necessary. pt has resumed square dancing.     Expected Outcomes pt will deomonstrate positive outlook with good coping skills.  Interventions Encouraged to attend Cardiac Rehabilitation for the exercise   Continue Psychosocial Services  No Follow up required      Vocational Rehabilitation: Provide vocational rehab assistance to qualifying candidates.   Vocational Rehab Evaluation & Intervention:     Vocational Rehab - 02/05/17 1219      Initial Vocational Rehab Evaluation & Intervention   Assessment shows need for Vocational Rehabilitation No      Education: Education Goals: Education classes will be provided on a weekly basis, covering required topics. Participant will state understanding/return demonstration of topics presented.  Learning  Barriers/Preferences:     Learning Barriers/Preferences - 02/05/17 0818      Learning Barriers/Preferences   Learning Barriers Sight   Learning Preferences Written Material;Skilled Demonstration;Pictoral;Verbal Instruction;Video      Education Topics: Count Your Pulse:  -Group instruction provided by verbal instruction, demonstration, patient participation and written materials to support subject.  Instructors address importance of being able to find your pulse and how to count your pulse when at home without a heart monitor.  Patients get hands on experience counting their pulse with staff help and individually.   CARDIAC REHAB PHASE II EXERCISE from 04/26/2017 in [place]  Date  04/12/17  Instruction Review Code  2- meets goals/outcomes      Heart Attack, Angina, and Risk Factor Modification:  -Group instruction provided by verbal instruction, video, and written materials to support subject.  Instructors address signs and symptoms of angina and heart attacks.    Also discuss risk factors for heart disease and how to make changes to improve heart health risk factors.   CARDIAC REHAB PHASE II EXERCISE from 04/26/2017 in [place]  Date  03/06/17  Instruction Review Code  2- meets goals/outcomes      Functional Fitness:  -Group instruction provided by verbal instruction, demonstration, patient participation, and written materials to support subject.  Instructors address safety measures for doing things around the house.  Discuss how to get up and down off the floor, how to pick things up properly, how to safely get out of a chair without assistance, and balance training.   CARDIAC REHAB PHASE II EXERCISE from 04/26/2017 in [place]  Date  04/26/17  Educator  Cleda Mccreedy, [place] RCEP  Instruction Review Code  2- meets goals/outcomes      Meditation and Mindfulness:  -Group  instruction provided by verbal instruction, patient participation, and written materials to support subject.  Instructor addresses importance of mindfulness and meditation practice to help reduce stress and improve awareness.  Instructor also leads participants through a meditation exercise.    CARDIAC REHAB PHASE II EXERCISE from 04/26/2017 in [place]  Date  03/27/17  Instruction Review Code  2- meets goals/outcomes      Stretching for Flexibility and Mobility:  -Group instruction provided by verbal instruction, patient participation, and written materials to support subject.  Instructors lead participants through series of stretches that are designed to increase flexibility thus improving mobility.  These stretches are additional exercise for major muscle groups that are typically performed during regular warm up and cool down.   CARDIAC REHAB PHASE II EXERCISE from 04/26/2017 in [place]  Date  04/05/17  Instruction Review Code  2- meets goals/outcomes      Hands Only CPR:  -Group verbal, video, and participation provides a basic overview of AHA guidelines for community CPR.  Role-play of emergencies allow participants the opportunity to practice calling for help and chest compression technique with discussion of AED use.   Hypertension: -Group verbal and written instruction that provides a basic overview of hypertension including the most recent diagnostic guidelines, risk factor reduction with self-care instructions and medication management.    Nutrition I class: Heart Healthy Eating:  -Group instruction provided by PowerPoint slides, verbal discussion, and written materials to support subject matter. The instructor gives an explanation and review of the Therapeutic Lifestyle Changes diet recommendations, which includes a discussion on lipid goals, dietary fat, sodium, fiber, plant stanol/sterol esters, sugar, and the  components of a well-balanced, healthy diet.   CARDIAC REHAB PHASE II EXERCISE from 04/26/2017 in [place]  Date  02/12/17  Educator  RD  Instruction Review Code  2- meets goals/outcomes      Nutrition II class: Lifestyle Skills:  -Group instruction provided by PowerPoint slides, verbal discussion, and written materials to support subject matter. The instructor gives an explanation and review of label reading, grocery shopping for heart health, heart healthy recipe modifications, and ways to make healthier choices when eating out.   CARDIAC REHAB PHASE II EXERCISE from 04/26/2017 in [place]  Date  02/19/17  Educator  RD  Instruction Review Code  2- meets goals/outcomes      Diabetes Question & Answer:  -Group instruction provided by PowerPoint slides, verbal discussion, and written materials to support subject matter. The instructor gives an explanation and review of diabetes co-morbidities, pre- and post-prandial blood glucose goals, pre-exercise blood glucose goals, signs, symptoms, and treatment of hypoglycemia and hyperglycemia, and foot care basics.   CARDIAC REHAB PHASE II EXERCISE from 04/26/2017 in [place]  Date  04/19/17  Educator  RD  Instruction Review Code  2- meets goals/outcomes      Diabetes Blitz:  -Group instruction provided by PowerPoint slides, verbal discussion, and written materials to support subject matter. The instructor gives an explanation and review of the physiology behind type 1 and type 2 diabetes, diabetes medications and rational behind using different medications, pre- and post-prandial blood glucose recommendations and Hemoglobin A1c goals, diabetes diet, and exercise including blood glucose guidelines for exercising safely.    CARDIAC REHAB PHASE II EXERCISE from 04/26/2017 in [place]  Date  03/19/17  Educator  RD   Instruction Review Code  2- meets goals/outcomes      Portion Distortion:  -Group instruction provided by PowerPoint slides, verbal discussion, written materials, and food models to support subject matter. The instructor gives an explanation of serving size versus portion size, changes in portions sizes over the last 20 years, and what consists of a serving from each food group.   CARDIAC REHAB PHASE II EXERCISE from 04/26/2017 in [place]  Date  04/04/17  Educator  RD  Instruction Review Code  2- meets goals/outcomes      Stress Management:  -Group instruction provided by verbal instruction, video, and written materials to support subject matter.  Instructors review role of stress in heart disease and how to cope with stress positively.     CARDIAC REHAB PHASE II EXERCISE from 04/26/2017 in [place]  Date  04/10/17  Instruction Review Code  2- meets goals/outcomes      Exercising on Your Own:  -Group instruction provided by verbal instruction, power point, and written materials  to support subject.  Instructors discuss benefits of exercise, components of exercise, frequency and intensity of exercise, and end points for exercise.  Also discuss use of nitroglycerin and activating EMS.  Review options of places to exercise outside of rehab.  Review guidelines for sex with heart disease.   CARDIAC REHAB PHASE II EXERCISE from 04/26/2017 in [place]  Date  02/27/17  Educator  Cleda Mccreedy, [place]  Instruction Review Code  2- meets goals/outcomes      Cardiac Drugs I:  -Group instruction provided by verbal instruction and written materials to support subject.  Instructor reviews cardiac drug classes: antiplatelets, anticoagulants, beta blockers, and statins.  Instructor discusses reasons, side effects, and lifestyle considerations for each drug class.   CARDIAC REHAB PHASE II EXERCISE  from 04/26/2017 in [place]  Date  03/20/17  Educator  Kennyth Lose  Instruction Review Code  2- meets goals/outcomes      Cardiac Drugs II:  -Group instruction provided by verbal instruction and written materials to support subject.  Instructor reviews cardiac drug classes: angiotensin converting enzyme inhibitors (ACE-I), angiotensin II receptor blockers (ARBs), nitrates, and calcium channel blockers.  Instructor discusses reasons, side effects, and lifestyle considerations for each drug class.   CARDIAC REHAB PHASE II EXERCISE from 04/26/2017 in [place]  Date  04/17/17  Educator  Kennyth Lose  Instruction Review Code  2- meets goals/outcomes      Anatomy and Physiology of the Circulatory System:  Group verbal and written instruction and models provide basic cardiac anatomy and physiology, with the coronary electrical and arterial systems. Review of: AMI, Angina, Valve disease, Heart Failure, Peripheral Artery Disease, Cardiac Arrhythmia, Pacemakers, and the ICD.   CARDIAC REHAB PHASE II EXERCISE from 04/26/2017 in [place]  Date  03/13/17  Instruction Review Code  2- meets goals/outcomes      Other Education:  -Group or individual verbal, written, or video instructions that support the educational goals of the cardiac rehab program.   Knowledge Questionnaire Score:     Knowledge Questionnaire Score - 04/23/17 1647      Knowledge Questionnaire Score   Post Score 24/24      Core Components/Risk Factors/Patient Goals at Admission:     Personal Goals and Risk Factors at Admission - 03/01/17 0945      Core Components/Risk Factors/Patient Goals on Admission    Weight Management Yes;Obesity;Weight Loss   Intervention Weight Management: Develop a combined nutrition and exercise program designed to reach desired caloric intake, while maintaining appropriate intake of nutrient and fiber,  sodium and fats, and appropriate energy expenditure required for the weight goal.;Weight Management: Provide education and appropriate resources to help participant work on and attain dietary goals.;Weight Management/Obesity: Establish reasonable short term and long term weight goals.;Obesity: Provide education and appropriate resources to help participant work on and attain dietary goals.   Expected Outcomes Short Term: Continue to assess and modify interventions until short term weight is achieved;Long Term: Adherence to nutrition and physical activity/exercise program aimed toward attainment of established weight goal;Weight Maintenance: Understanding of the daily nutrition guidelines, which includes 25-35% calories from fat, 7% or less cal from saturated fats, less than 200mg  cholesterol, less than 1.5gm of sodium, & 5 or more servings of fruits and vegetables daily;Weight Loss: Understanding of general recommendations for a balanced deficit meal plan, which promotes 1-2 lb weight loss per week and includes a negative energy balance  of [phone] kcal/d;Understanding recommendations for meals to include 15-35% energy as protein, 25-35% energy from fat, 35-60% energy from carbohydrates, less than 200mg  of dietary cholesterol, 20-35 gm of total fiber daily;Understanding of distribution of calorie intake throughout the day with the consumption of 4-5 meals/snacks   Diabetes Yes   Intervention Provide education about signs/symptoms and action to take for hypo/hyperglycemia.;Provide education about proper nutrition, including hydration, and aerobic/resistive exercise prescription along with prescribed medications to achieve blood glucose in normal ranges: Fasting glucose 65-99 mg/dL   Expected Outcomes Long Term: Attainment of HbA1C < 7%.;Short Term: Participant verbalizes understanding of the signs/symptoms and immediate care of hyper/hypoglycemia, proper foot care and importance of medication, aerobic/resistive  exercise and nutrition plan for blood glucose control.   Hypertension Yes   Intervention Provide education on lifestyle modifcations including regular physical activity/exercise, weight management, moderate sodium restriction and increased consumption of fresh fruit, vegetables, and low fat dairy, alcohol moderation, and smoking cessation.;Monitor prescription use compliance.   Expected Outcomes Short Term: Continued assessment and intervention until BP is < 140/89mm HG in hypertensive participants. < 130/64mm HG in hypertensive participants with diabetes, heart failure or chronic kidney disease.;Long Term: Maintenance of blood pressure at goal levels.   Lipids Yes   Intervention Provide education and support for participant on nutrition & aerobic/resistive exercise along with prescribed medications to achieve LDL 70mg , HDL >40mg .   Expected Outcomes Short Term: Participant states understanding of desired cholesterol values and is compliant with medications prescribed. Participant is following exercise prescription and nutrition guidelines.;Long Term: Cholesterol controlled with medications as prescribed, with individualized exercise RX and with personalized nutrition plan. Value goals: LDL < 70mg , HDL > 40 mg.   Personal Goal Other Yes   Personal Goal Be able to get back to playing golf without being limited by back pain, Long term: lose weight, goal at 185-190lbs   Intervention Provide exercise programming and education on nutrition couseling to assist with weightloss and improving cardiorespiratory fitness.   Expected Outcomes Pt will be able to return to hobbies of bowling, playing golf and square dancing without being limited by back pain and endurance.      Core Components/Risk Factors/Patient Goals Review:      Goals and Risk Factor Review    Row Name 02/27/17 1222 03/05/17 1641 04/02/17 0714 05/01/17 1109       Core Components/Risk Factors/Patient Goals Review   Personal Goals Review  Weight Management/Obesity;Diabetes;Hypertension;Lipids;Other Weight Management/Obesity;Diabetes;Hypertension;Lipids;Other Weight Management/Obesity;Diabetes;Hypertension;Lipids;Other Weight Management/Obesity;Diabetes;Hypertension;Lipids;Other    Review Pt progressing as expected with excellent participation in CR.   pt exhibits appropriate DM self management.  pt reports activities are getting easier for him to perform without symptoms.  pt is using stationary bike 92minutes at home without symptoms on non CR days.  Will continue to monitor and intervene as necessary.  Pt progressing as expected with excellent participation in CR.   pt exhibits appropriate DM self management.  pt reports activities are getting easier for him to perform without symptoms.  pt is using stationary bike 56minutes at home without symptoms on non CR days.  Will continue to monitor and intervene as necessary.  Pt progressing as expected with excellent participation in CR.   pt exhibits appropriate DM self management. pt is experiencing CP with activities.  Nuclear stress test obtained.  pt awaiting MD OV to discuss findings and further testing.  pt has started Imdur 30mg .  pt workloads have been decreased until further testing obtained.  pt graduated CR program today with  36 visits. pt pleased with his overall progress, especially increasing strength/stamina and decreased chest pain. pt able to tolerate activities at home with greater ease. pt discouraged he did not successful lose weight. pt encouraged to continue weight loss efforts.     Expected Outcomes pt will continue CR attendance focusing on exercise, education and nutrition to reduce RF including DM, HTN, lipds and obesity.   pt will continue CR attendance focusing on exercise, education and nutrition to reduce RF including DM, HTN, lipds and obesity.   pt will continue CR attendance focusing on exercise, education and nutrition to reduce RF including DM, HTN, lipds and obesity.    pt will continue exercise, nutrition and medication regimen for CAD management.         Core Components/Risk Factors/Patient Goals at Discharge (Final Review):      Goals and Risk Factor Review - 05/01/17 1109      Core Components/Risk Factors/Patient Goals Review   Personal Goals Review Weight Management/Obesity;Diabetes;Hypertension;Lipids;Other   Review pt graduated CR program today with 36 visits. pt pleased with his overall progress, especially increasing strength/stamina and decreased chest pain. pt able to tolerate activities at home with greater ease. pt discouraged he did not successful lose weight. pt encouraged to continue weight loss efforts.    Expected Outcomes pt will continue exercise, nutrition and medication regimen for CAD management.        ITP Comments:     ITP Comments    Row Name 02/05/17 0815 03/22/17 0856 05/01/17 1108       ITP Comments Dr. Fransico Him, Medical Director 03/22/17, HTN meets goals/outcomes DR. Fransico Him, Medical Director         Comments: Pt is making expected progress toward personal goals after completing 36 sessions. Recommend continued exercise and life style modification education including  stress management and relaxation techniques to decrease cardiac risk profile.

## 2017-05-03 ENCOUNTER — Encounter (HOSPITAL_COMMUNITY): Payer: Medicare Other

## 2017-05-06 ENCOUNTER — Encounter (HOSPITAL_COMMUNITY): Payer: Medicare Other

## 2017-05-08 ENCOUNTER — Encounter (HOSPITAL_COMMUNITY): Payer: Medicare Other

## 2017-05-10 ENCOUNTER — Encounter: Payer: Self-pay | Admitting: Cardiology

## 2017-05-10 ENCOUNTER — Encounter (HOSPITAL_COMMUNITY): Payer: Medicare Other

## 2017-05-10 ENCOUNTER — Ambulatory Visit (INDEPENDENT_AMBULATORY_CARE_PROVIDER_SITE_OTHER): Payer: Medicare Other | Admitting: Cardiology

## 2017-05-10 VITALS — BP 108/60 | HR 60 | Ht 69.0 in | Wt 208.0 lb

## 2017-05-10 DIAGNOSIS — Z951 Presence of aortocoronary bypass graft: Secondary | ICD-10-CM | POA: Diagnosis not present

## 2017-05-10 DIAGNOSIS — I2510 Atherosclerotic heart disease of native coronary artery without angina pectoris: Secondary | ICD-10-CM

## 2017-05-10 DIAGNOSIS — K219 Gastro-esophageal reflux disease without esophagitis: Secondary | ICD-10-CM

## 2017-05-10 DIAGNOSIS — E782 Mixed hyperlipidemia: Secondary | ICD-10-CM | POA: Diagnosis not present

## 2017-05-10 NOTE — Patient Instructions (Signed)

## 2017-05-10 NOTE — Progress Notes (Signed)
Cardiology Office Note:    Date:  05/10/2017   ID:  Andrew Williamson, DOB 08-13-1946, MRN [id number]  PCP:  Lujean Amel, MD  Cardiologist:  Candee Furbish, MD    Referring MD: Lujean Amel, MD     History of Present Illness:    Andrew Williamson is a 71 y.o. male with CAD status post CABG 12/2016 by Dr. Servando Snare, LIMA to LAD, SVG to ramus, SVG to circumflex, with hypertension diabetes obesity history of prostate cancer here for follow-up.  Had a nuclear stress test for exertional chest discomfort which showed inferolateral ischemia. We decided to continue with medical management. Overall he has been doing very well. Occasionally will have epigastric discomfort that is relieved with burping. Clearly GI. Sometimes it is hard for him to distinguish this from typical angina. Prilosec being taken.  His metoprolol was increased.  Past Medical History:  Diagnosis Date  . Allergic rhinitis   . Coronary artery disease involving native coronary artery of native heart without angina pectoris 01/07/2017   a - Myoview 12/17:  EF 55, inf-lateral ischemia; intermediate risk // b - LHC 12/05/16: oLM 50, oLAD 40, dLAD 50, RI 25, oLCx 95, mLCx 95, RCA luminal irregs // c - Echo 12/17: EF 55-60, no RWMA, Gr 1 DD // d. 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 DJD (degenerative joint disease)    hips  . ED (erectile dysfunction)   . GERD (gastroesophageal reflux disease)   . Gynecomastia, male   . History of echocardiogram    Echo 12/17: EF 55-60, normal wall motion, grade 1 diastolic dysfunction  . History of nuclear stress test    Myoview 12/17: EF 55, inf-lateral ischemia; intermediate risk  . HLD (hyperlipidemia)    hx of ? elevated LFTs on statin Rx  . Hypertension   . Obesity   . Prostate cancer [facility])    s/p radical prostatectomy  . Spinal stenosis     Past Surgical History:  Procedure Laterality Date  . CARDIAC  CATHETERIZATION Andrew Williamson 12/05/2016   Procedure: Left Heart Cath and Coronary Angiography;  Surgeon: Jettie Booze, MD;  Location: [facility];  Service: Cardiovascular;  Laterality: Andrew Williamson;  . CARDIAC CATHETERIZATION Andrew Williamson 12/05/2016   Procedure: Intravascular Ultrasound/IVUS;  Surgeon: Jettie Booze, MD;  Location: [facility];  Service: Cardiovascular;  Laterality: Andrew Williamson;  . COLONOSCOPY  06/2014   FU 10 years  . CORONARY ARTERY BYPASS GRAFT Andrew Williamson 12/19/2016   Procedure: CORONARY ARTERY BYPASS GRAFTING (CABG) x three , using left internal mammary artery and right leg greater saphenous vein harvested endoscopically; SVG to Ramus, SVG to circ, LIMA to LAD ;  Surgeon: [name], MD;  Location: [facility];  Service: Open Heart Surgery;  Laterality: Andrew Williamson;  . LAPAROSCOPIC CHOLECYSTECTOMY     2012  . ROBOT ASSISTED LAPAROSCOPIC RADICAL PROSTATECTOMY Andrew Williamson 08/11/2015   Procedure: ROBOTIC ASSISTED LAPAROSCOPIC RADICAL PROSTATECTOMY;  Surgeon: Irine Seal, MD;  Location: [facility];  Service: Urology;  Laterality: Andrew Williamson;  . TEE WITHOUT CARDIOVERSION Andrew Williamson 12/19/2016   Procedure: TRANSESOPHAGEAL ECHOCARDIOGRAM (TEE);  Surgeon: [name], MD;  Location: [facility];  Service: Open Heart Surgery;  Laterality: Andrew Williamson;    Current Medications: Current Meds  Medication Sig  . aspirin EC 81 MG tablet Take 1 tablet (81 mg total) by mouth daily.  Marland Kitchen gabapentin (NEURONTIN) 300 MG capsule Take 300 mg by mouth 3 (three)  times daily.   . isosorbide mononitrate (IMDUR) 30 MG 24 hr tablet Take 1 tablet (30 mg total) by mouth daily.  Marland Kitchen lisinopril (PRINIVIL,ZESTRIL) 5 MG tablet Take 1 tablet (5 mg total) by mouth daily.  . metFORMIN (GLUCOPHAGE) 1000 MG tablet Take 1,000 mg by mouth 2 (two) times daily with a meal.  . metoprolol (LOPRESSOR) 50 MG tablet Take 1.5 tablets (75 mg total) by mouth 2 (two) times daily.  . Multiple Vitamins-Minerals (CENTRUM SILVER PO) Take 1 tablet by mouth daily.  Marland Kitchen omeprazole (PRILOSEC) 40  MG capsule Take 40 mg by mouth 2 (two) times daily.  . rosuvastatin (CRESTOR) 20 MG tablet Take 20 mg by mouth daily.      Allergies:   Thimerosal   Social History   Social History  . Marital status: Widowed    Spouse name: Andrew Williamson  . Number of children: Andrew Williamson  . Years of education: Andrew Williamson   Social History Main Topics  . Smoking status: Never Smoker  . Smokeless tobacco: Current User    Types: Chew  . Alcohol use Yes     Comment: rarely  . Drug use: No  . Sexual activity: Not Asked   Other Topics Concern  . None   Social History Narrative   Widower wife died at 28 of breast CA   Children: 2 step daughters   Retired - Insurance underwriter   Native of [place] and moved to [place] in [place] in [employer] during [place] (Radio broadcast assistant) - [place], [place]     Family History: The patient's family history includes Heart attack in his maternal uncle; Heart attack (age of onset: 60) in his mother; Heart failure (age of onset: 68) in his father; Peripheral Artery Disease in his father. ROS:   Please see the history of present illness.     All other systems reviewed and are negative.  EKGs/Labs/Other Studies Reviewed:    The following studies were reviewed today: Myoview 03/28/17 EF 59, inf-lat ischemia, 1 mm J point depression >> down-sloping at peak exercise; High Risk  Carotid US 12/17/16 Bilateral: intimal wall thickening CCA. 1-39% ICA plaquing.  LHC 12/05/16 >> CABG LM ostial 50 LAD ostial 40, distal 50 RI 25 LCx ostial 95, mid 95 RCA luminal irregs EF 55-65  Echo 12/17:  EF 55-60, normal wall motion, grade 1 diastolic dysfunction  Myoview 12/17: EF 55, inf-lateral ischemia; intermediate risk  EKG:  EKG is none ordered today.    Recent Labs: 12/17/2016: ALT 19 12/20/2016: Magnesium 2.3 12/22/2016: Hemoglobin 12.6; Platelets 151 12/24/2016: BUN 21; Creatinine, Ser 1.09; Potassium 3.8; Sodium 139   Recent Lipid Panel No results found for: CHOL, TRIG, HDL, CHOLHDL,  VLDL, LDLCALC, LDLDIRECT  Physical Exam:    VS:  BP 108/60   Pulse 60   Ht 5\' 9"  (1.753 m)   Wt 208 lb (94.3 kg)   BMI 30.72 kg/m     Wt Readings from Last 3 Encounters:  05/10/17 208 lb (94.3 kg)  04/19/17 207 lb 7.3 oz (94.1 kg)  04/09/17 207 lb 12.8 oz (94.3 kg)     GEN:  Well nourished, well developed in no acute distress HEENT: Normal NECK: No JVD; No carotid bruits LYMPHATICS: No lymphadenopathy CARDIAC: RRR, no murmurs, rubs, gallops RESPIRATORY:  Clear to auscultation without rales, wheezing or rhonchi  ABDOMEN: Soft, non-tender, non-distended, overweight MUSCULOSKELETAL:  No edema; No deformity  SKIN: Warm and dry NEUROLOGIC:  Alert and oriented x 3 PSYCHIATRIC:  Normal affect  ASSESSMENT:    1. CAD in native artery   2. S/P CABG x 3   3. Mixed hyperlipidemia   4. Gastroesophageal reflux disease without esophagitis    PLAN:    In order of problems listed above:  Coronary artery disease/angina, post bypass  - Has been feeling better since increasing the metoprolol to 75  - No changes made. Continue with isosorbide and other secondary prevention.  - Continuing to enjoy cardiac rehabilitation. May consider volunteering there. He is going to the gym almost daily.  Essential hypertension  - Good overall blood pressure control.  GERD  -Prilosec  Hyperlipidemia  - Continue statin.  6 month follow-up  Medication Adjustments/Labs and Tests Ordered: Current medicines are reviewed at length with the patient today.  Concerns regarding medicines are outlined above. Labs and tests ordered and medication changes are outlined in the patient instructions below:  Patient Instructions  Medication Instructions:  The current medical regimen is effective;  continue present plan and medications.  Follow-Up: Follow up in 6 months with Dr. Marlou Porch.  You will receive a letter in the mail 2 months before you are due.  Please call us when you receive this letter to  schedule your follow up appointment.  If you need a refill on your cardiac medications before your next appointment, please call your pharmacy.  Thank you for choosing [facility]!!        Signed, Candee Furbish, MD  05/10/2017 11:16 AM    [place]

## 2017-05-13 ENCOUNTER — Encounter (HOSPITAL_COMMUNITY): Payer: Medicare Other

## 2017-05-15 ENCOUNTER — Encounter (HOSPITAL_COMMUNITY): Payer: Medicare Other

## 2017-05-17 ENCOUNTER — Encounter (HOSPITAL_COMMUNITY): Payer: Medicare Other

## 2017-05-20 ENCOUNTER — Encounter (HOSPITAL_COMMUNITY): Payer: Medicare Other

## 2017-05-22 ENCOUNTER — Encounter (HOSPITAL_COMMUNITY): Payer: Medicare Other

## 2017-05-24 ENCOUNTER — Encounter (HOSPITAL_COMMUNITY): Payer: Medicare Other

## 2017-05-27 ENCOUNTER — Encounter (HOSPITAL_COMMUNITY): Payer: Medicare Other

## 2017-05-29 ENCOUNTER — Encounter (HOSPITAL_COMMUNITY): Payer: Medicare Other

## 2017-05-29 ENCOUNTER — Ambulatory Visit (HOSPITAL_COMMUNITY): Payer: Self-pay | Admitting: Cardiac Rehabilitation

## 2017-05-29 DIAGNOSIS — Z951 Presence of aortocoronary bypass graft: Secondary | ICD-10-CM

## 2017-05-29 NOTE — Progress Notes (Signed)
Discharge Summary  Patient Details  Name: Andrew Williamson MRN: [id number] Date of Birth: 1946/02/28 Referring Provider:     [place] from 02/05/2017 in [place]  Referring Provider  Candee Furbish MD       Number of Visits: 36   Reason for Discharge:  Patient reached a stable level of exercise.  Smoking History:  History  Smoking Status  . Never Smoker  Smokeless Tobacco  . Current User  . Types: Chew    Diagnosis:  12/19/16 S/P CABG x 3  ADL UCSD:   Initial Exercise Prescription:     Initial Exercise Prescription - 02/05/17 1100      Date of Initial Exercise RX and Referring Provider   Date 02/05/17   Referring Provider Candee Furbish MD     Treadmill   MPH 2.3   Grade 1   Minutes 10   METs 3.08     Bike   Level 0.5   Minutes 10   METs 1.99     NuStep   Level 3   SPM 80   Minutes 10   METs 2     Prescription Details   Frequency (times per week) 3   Duration Progress to 30 minutes of continuous aerobic without signs/symptoms of physical distress     Intensity   THRR 40-80% of Max Heartrate 60-120   Ratings of Perceived Exertion 11-13   Perceived Dyspnea 0-4     Progression   Progression Continue to progress workloads to maintain intensity without signs/symptoms of physical distress.     Resistance Training   Training Prescription Yes   Weight 2lbs   Reps 10-15      Discharge Exercise Prescription (Final Exercise Prescription Changes):     Exercise Prescription Changes - 05/01/17 1035      Response to Exercise   Blood Pressure (Admit) 100/62   Blood Pressure (Exercise) 122/78   Blood Pressure (Exit) 118/74   Heart Rate (Admit) 59 bpm   Heart Rate (Exercise) 100 bpm   Heart Rate (Exit) 69 bpm   Rating of Perceived Exertion (Exercise) 12   Symptoms none   Duration Progress to 45 minutes of aerobic exercise without signs/symptoms of physical distress   Intensity THRR unchanged      Progression   Progression Continue to progress workloads to maintain intensity without signs/symptoms of physical distress.   Average METs 3.7     Resistance Training   Training Prescription Yes   Weight 4lbs   Reps 10-15   Time 10 Minutes     Treadmill   MPH 2.6   Grade 2   Minutes 10   METs 3.71     Recumbant Bike   Level 3  upright scifit   Minutes 10   METs 3.7     NuStep   Level 5   SPM 90   Minutes 10   METs 3.9     Home Exercise Plan   Plans to continue exercise at [employer] (comment)  [employer]   Frequency Add 2 additional days to program exercise sessions.   Initial Home Exercises Provided 02/25/17      Functional Capacity:     6 Minute Walk    Row Name 02/05/17 1031 04/19/17 0911 04/19/17 1009     6 Minute Walk   Phase Initial Discharge  -   Distance 1400 feet 1600 feet  -   Distance % Change  -  -  14.29 %   Walk Time 6 minutes 6 minutes  -   # of Rest Breaks 0 0  -   MPH 2.65  -  -   METS 3.07  -  -   RPE 10 12  -   VO2 Peak 10.74  - 10.9   Symptoms Yes (comment)  - No   Comments Patient c/o dyspnea.  -  -   Resting HR 71 bpm  - 61 bpm   Resting BP 110/57  - 104/62   Max Ex. HR 102 bpm  - 86 bpm   Max Ex. BP 148/78  - 130/60   2 Minute Post BP 104/60  - 114/70      Psychological, QOL, Others - Outcomes: PHQ 2/9: Depression screen [facility] 2/9 05/01/2017 02/11/2017  Decreased Interest 0 0  Down, Depressed, Hopeless 0 0  PHQ - 2 Score 0 0    Quality of Life:     Quality of Life - 04/23/17 1647      Quality of Life Scores   Health/Function Pre 26.93 %   Health/Function Post 24 %   Health/Function % Change -10.88 %   Socioeconomic Pre 28.07 %   Socioeconomic Post 27.36 %   Socioeconomic % Change  -2.53 %   Psych/Spiritual Pre 28.18 %   Psych/Spiritual Post 25.5 %   Psych/Spiritual % Change -9.51 %   Family Pre 28.5 %   Family Post 28.5 %   Family % Change 0 %   GLOBAL Pre 27.65 %   GLOBAL Post 25.58 %   GLOBAL %  Change -7.49 %      Personal Goals: Goals established at orientation with interventions provided to work toward goal.     Personal Goals and Risk Factors at Admission - 05/10/17 1615      Core Components/Risk Factors/Patient Goals on Admission   Personal Goal Pt has returned to playing golf and understand limitations. Pt will continue to work on weightloss by way of nutrition and exercise.        Personal Goals Discharge:     Goals and Risk Factor Review    Row Name 02/27/17 1222 03/05/17 1641 04/02/17 0714 05/01/17 1109       Core Components/Risk Factors/Patient Goals Review   Personal Goals Review Weight Management/Obesity;Diabetes;Hypertension;Lipids;Other Weight Management/Obesity;Diabetes;Hypertension;Lipids;Other Weight Management/Obesity;Diabetes;Hypertension;Lipids;Other Weight Management/Obesity;Diabetes;Hypertension;Lipids;Other    Review Pt progressing as expected with excellent participation in CR.   pt exhibits appropriate DM self management.  pt reports activities are getting easier for him to perform without symptoms.  pt is using stationary bike 55minutes at home without symptoms on non CR days.  Will continue to monitor and intervene as necessary.  Pt progressing as expected with excellent participation in CR.   pt exhibits appropriate DM self management.  pt reports activities are getting easier for him to perform without symptoms.  pt is using stationary bike 11minutes at home without symptoms on non CR days.  Will continue to monitor and intervene as necessary.  Pt progressing as expected with excellent participation in CR.   pt exhibits appropriate DM self management. pt is experiencing CP with activities.  Nuclear stress test obtained.  pt awaiting MD OV to discuss findings and further testing.  pt has started Imdur 30mg .  pt workloads have been decreased until further testing obtained.  pt graduated CR program today with 36 visits. pt pleased with his overall progress,  especially increasing strength/stamina and decreased chest pain. pt able to tolerate activities at  home with greater ease. pt discouraged he did not successful lose weight. pt encouraged to continue weight loss efforts.     Expected Outcomes pt will continue CR attendance focusing on exercise, education and nutrition to reduce RF including DM, HTN, lipds and obesity.   pt will continue CR attendance focusing on exercise, education and nutrition to reduce RF including DM, HTN, lipds and obesity.   pt will continue CR attendance focusing on exercise, education and nutrition to reduce RF including DM, HTN, lipds and obesity.   pt will continue exercise, nutrition and medication regimen for CAD management.         Nutrition & Weight - Outcomes:     Pre Biometrics - 02/05/17 1034      Pre Biometrics   Height 5\' 9"  (1.753 m)   Weight 207 lb 0.2 oz (93.9 kg)   Waist Circumference 41.5 inches   Hip Circumference 44.5 inches   Waist to Hip Ratio 0.93 %   BMI (Calculated) 30.6   Triceps Skinfold 35 mm   % Body Fat 32.8 %   Grip Strength 45 kg   Flexibility 8 in   Single Leg Stand 9.4 seconds         Post Biometrics - 04/19/17 1012       Post  Biometrics   Height 5\' 9"  (1.753 m)   Weight 207 lb 7.3 oz (94.1 kg)   BMI (Calculated) 30.7   % Body Fat 32.8 %   Grip Strength 45 kg      Nutrition:     Nutrition Therapy & Goals - 03/01/17 0944      Nutrition Therapy   Diet Carb Modified, Therapeutic Lifestyle Changes     Personal Nutrition Goals   Nutrition Goal Wt loss of 1-2 lb/week to a wt loss goal of 6-24 lb at graduation from Cardiac Rehab   Personal Goal #2 Pt to identify and limit sources of saturated fat, trans fat, and sodium.    Personal Goal #3 Pt to continue to have CBG concentrations in the normal range or as close to normal as is safely possible.     Intervention Plan   Intervention Prescribe, educate and counsel regarding individualized specific dietary modifications  aiming towards targeted core components such as weight, hypertension, lipid management, diabetes, heart failure and other comorbidities.   Expected Outcomes Short Term Goal: Understand basic principles of dietary content, such as calories, fat, sodium, cholesterol and nutrients.;Long Term Goal: Adherence to prescribed nutrition plan.      Nutrition Discharge:     Nutrition Assessments - 05/20/17 1411      MEDFICTS Scores   Pre Score 65   Post Score 42   Score Difference -23      Education Questionnaire Score:     Knowledge Questionnaire Score - 04/23/17 1647      Knowledge Questionnaire Score   Post Score 24/24      Goals reviewed with patient; copy given to patient.

## 2017-05-29 NOTE — Addendum Note (Signed)
Encounter addended by: Lowell Guitar, RN on: 05/29/2017  4:36 PM<BR>    Actions taken: Episode resolved

## 2017-05-31 ENCOUNTER — Encounter (HOSPITAL_COMMUNITY): Payer: Medicare Other

## 2017-06-03 ENCOUNTER — Encounter (HOSPITAL_COMMUNITY): Payer: Medicare Other

## 2017-06-04 DIAGNOSIS — E119 Type 2 diabetes mellitus without complications: Secondary | ICD-10-CM | POA: Diagnosis not present

## 2017-06-04 DIAGNOSIS — L570 Actinic keratosis: Secondary | ICD-10-CM | POA: Diagnosis not present

## 2017-06-04 DIAGNOSIS — I10 Essential (primary) hypertension: Secondary | ICD-10-CM | POA: Diagnosis not present

## 2017-06-04 DIAGNOSIS — E669 Obesity, unspecified: Secondary | ICD-10-CM | POA: Diagnosis not present

## 2017-06-04 DIAGNOSIS — Z7984 Long term (current) use of oral hypoglycemic drugs: Secondary | ICD-10-CM | POA: Diagnosis not present

## 2017-06-04 DIAGNOSIS — K219 Gastro-esophageal reflux disease without esophagitis: Secondary | ICD-10-CM | POA: Diagnosis not present

## 2017-06-04 DIAGNOSIS — Z23 Encounter for immunization: Secondary | ICD-10-CM | POA: Diagnosis not present

## 2017-06-05 ENCOUNTER — Encounter (HOSPITAL_COMMUNITY): Payer: Medicare Other

## 2017-06-07 ENCOUNTER — Encounter (HOSPITAL_COMMUNITY): Payer: Medicare Other

## 2017-06-07 DIAGNOSIS — L570 Actinic keratosis: Secondary | ICD-10-CM | POA: Diagnosis not present

## 2017-06-07 DIAGNOSIS — L821 Other seborrheic keratosis: Secondary | ICD-10-CM | POA: Diagnosis not present

## 2017-06-07 DIAGNOSIS — D2372 Other benign neoplasm of skin of left lower limb, including hip: Secondary | ICD-10-CM | POA: Diagnosis not present

## 2017-06-07 DIAGNOSIS — D1801 Hemangioma of skin and subcutaneous tissue: Secondary | ICD-10-CM | POA: Diagnosis not present

## 2017-06-10 ENCOUNTER — Encounter (HOSPITAL_COMMUNITY): Payer: Medicare Other

## 2017-07-24 DIAGNOSIS — C61 Malignant neoplasm of prostate: Secondary | ICD-10-CM | POA: Diagnosis not present

## 2017-07-31 DIAGNOSIS — N5231 Erectile dysfunction following radical prostatectomy: Secondary | ICD-10-CM | POA: Diagnosis not present

## 2017-07-31 DIAGNOSIS — N393 Stress incontinence (female) (male): Secondary | ICD-10-CM | POA: Diagnosis not present

## 2017-07-31 DIAGNOSIS — Z8546 Personal history of malignant neoplasm of prostate: Secondary | ICD-10-CM | POA: Diagnosis not present

## 2017-09-06 DIAGNOSIS — Z23 Encounter for immunization: Secondary | ICD-10-CM | POA: Diagnosis not present

## 2017-10-04 DIAGNOSIS — I10 Essential (primary) hypertension: Secondary | ICD-10-CM | POA: Diagnosis not present

## 2017-10-04 DIAGNOSIS — E669 Obesity, unspecified: Secondary | ICD-10-CM | POA: Diagnosis not present

## 2017-10-04 DIAGNOSIS — E7800 Pure hypercholesterolemia, unspecified: Secondary | ICD-10-CM | POA: Diagnosis not present

## 2017-10-04 DIAGNOSIS — K219 Gastro-esophageal reflux disease without esophagitis: Secondary | ICD-10-CM | POA: Diagnosis not present

## 2017-10-04 DIAGNOSIS — E119 Type 2 diabetes mellitus without complications: Secondary | ICD-10-CM | POA: Diagnosis not present

## 2017-10-14 IMAGING — CR DG CHEST 1V PORT
1 series · 1 of 1 positions shown · non-contrast
Comparison: PA and lateral chest x-ray December 17, 2016

CLINICAL DATA: Needle count off.

EXAM:
PORTABLE CHEST 1 VIEW

[AP]
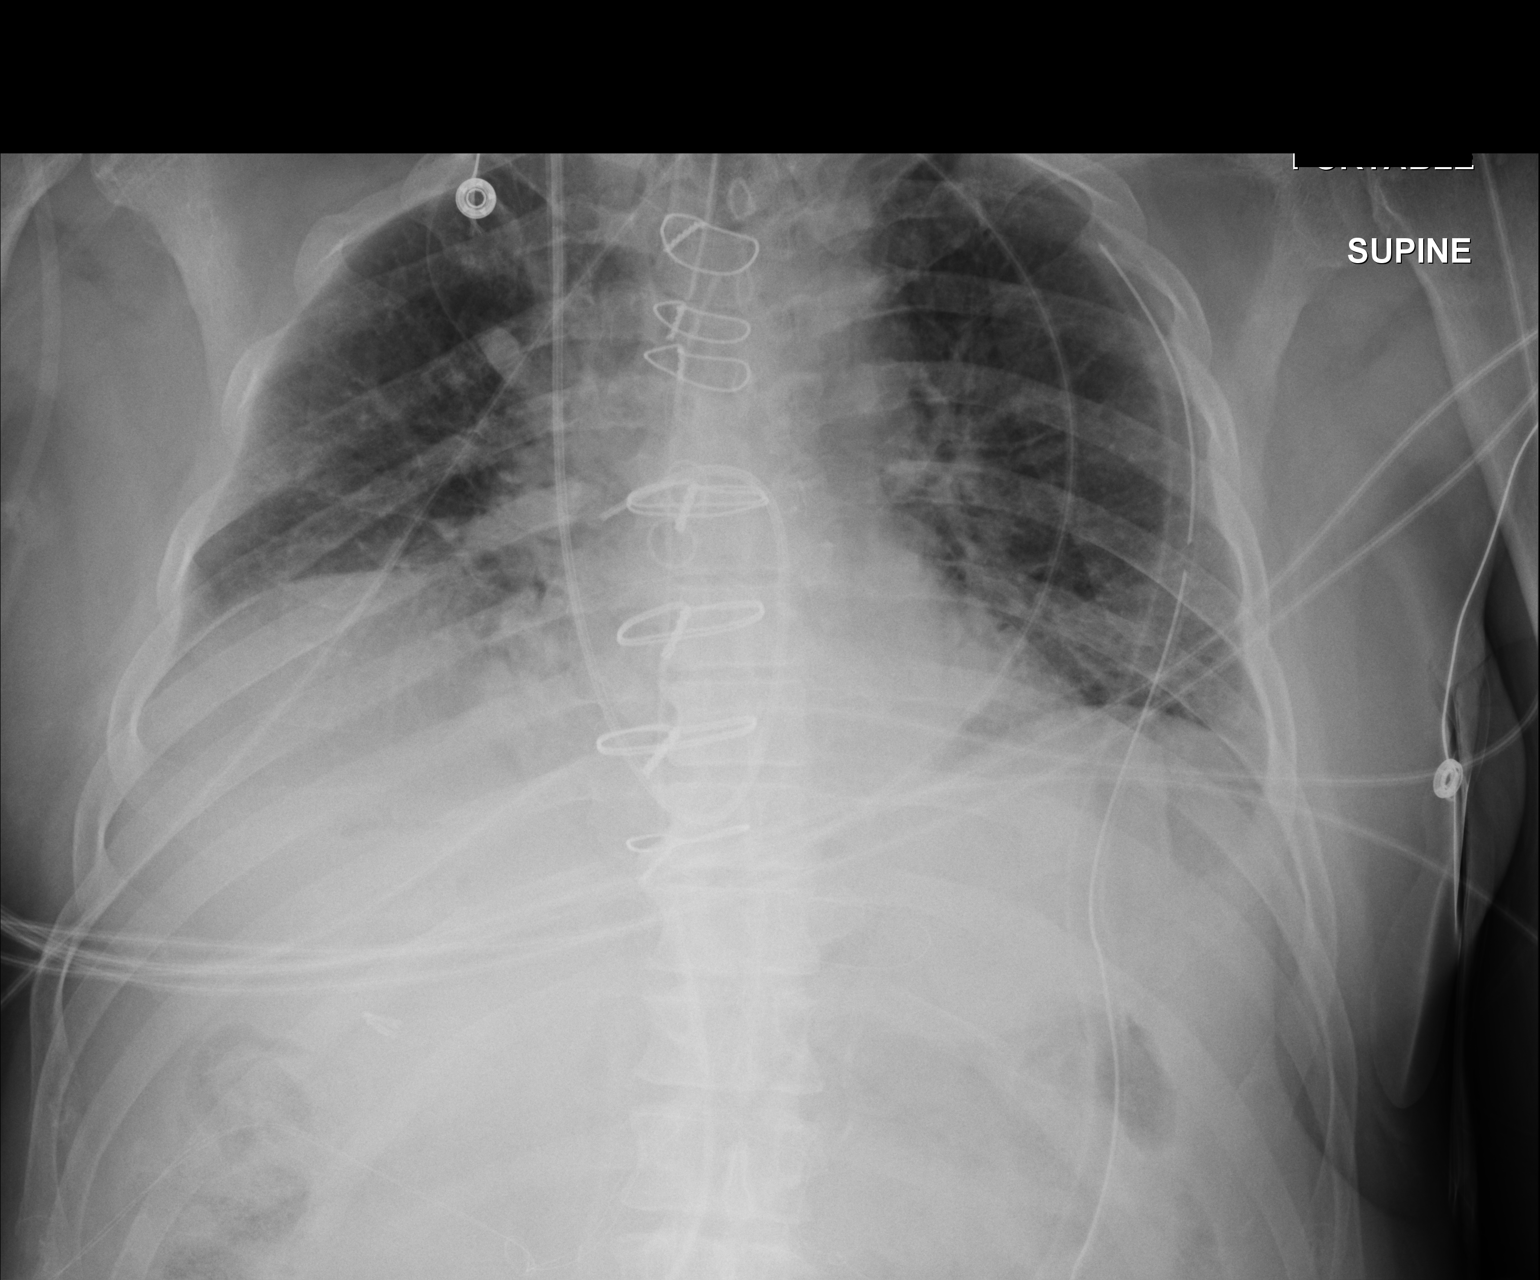

[1 of 1 positions shown; findings below may reference images not displayed]

FINDINGS: The patient is undergone CABG. No retained needle within the thorax
is observed. There are surgical clips in the gallbladder fossa.

The lungs are hypoinflated. There is an azygos lobe anatomy on the
right. There is no pneumothorax. The left-sided chest tube tip
projects over the posterolateral aspect of the third rib. The heart
is normal in size. The pulmonary vascularity is mildly prominent
centrally. There is prominence of the azygos vein. The Swan-Ganz
catheter tip projects in the distal right main pulmonary artery. The
endotracheal tube tip lies approximately 3 cm above the carina. The
mediastinal drain lies at approximately T5.
IMPRESSION: No evidence of a retained surgical needle within the chest.

Post CABG changes with hypoinflation. No pleural effusion,
pneumothorax, nor more than minimal pulmonary edema. The support
tubes are in reasonable position.

This report was called by me to Jim in the operating room at [DATE]
p.m. on 19 December, 2016.

## 2017-10-14 IMAGING — CR DG CHEST 1V PORT
1 series · 1 of 1 positions shown · non-contrast
Comparison: 12/19/2016 and 12/27/2016.

CLINICAL DATA: Postop CABG.

EXAM:
PORTABLE CHEST 1 VIEW

[AP]
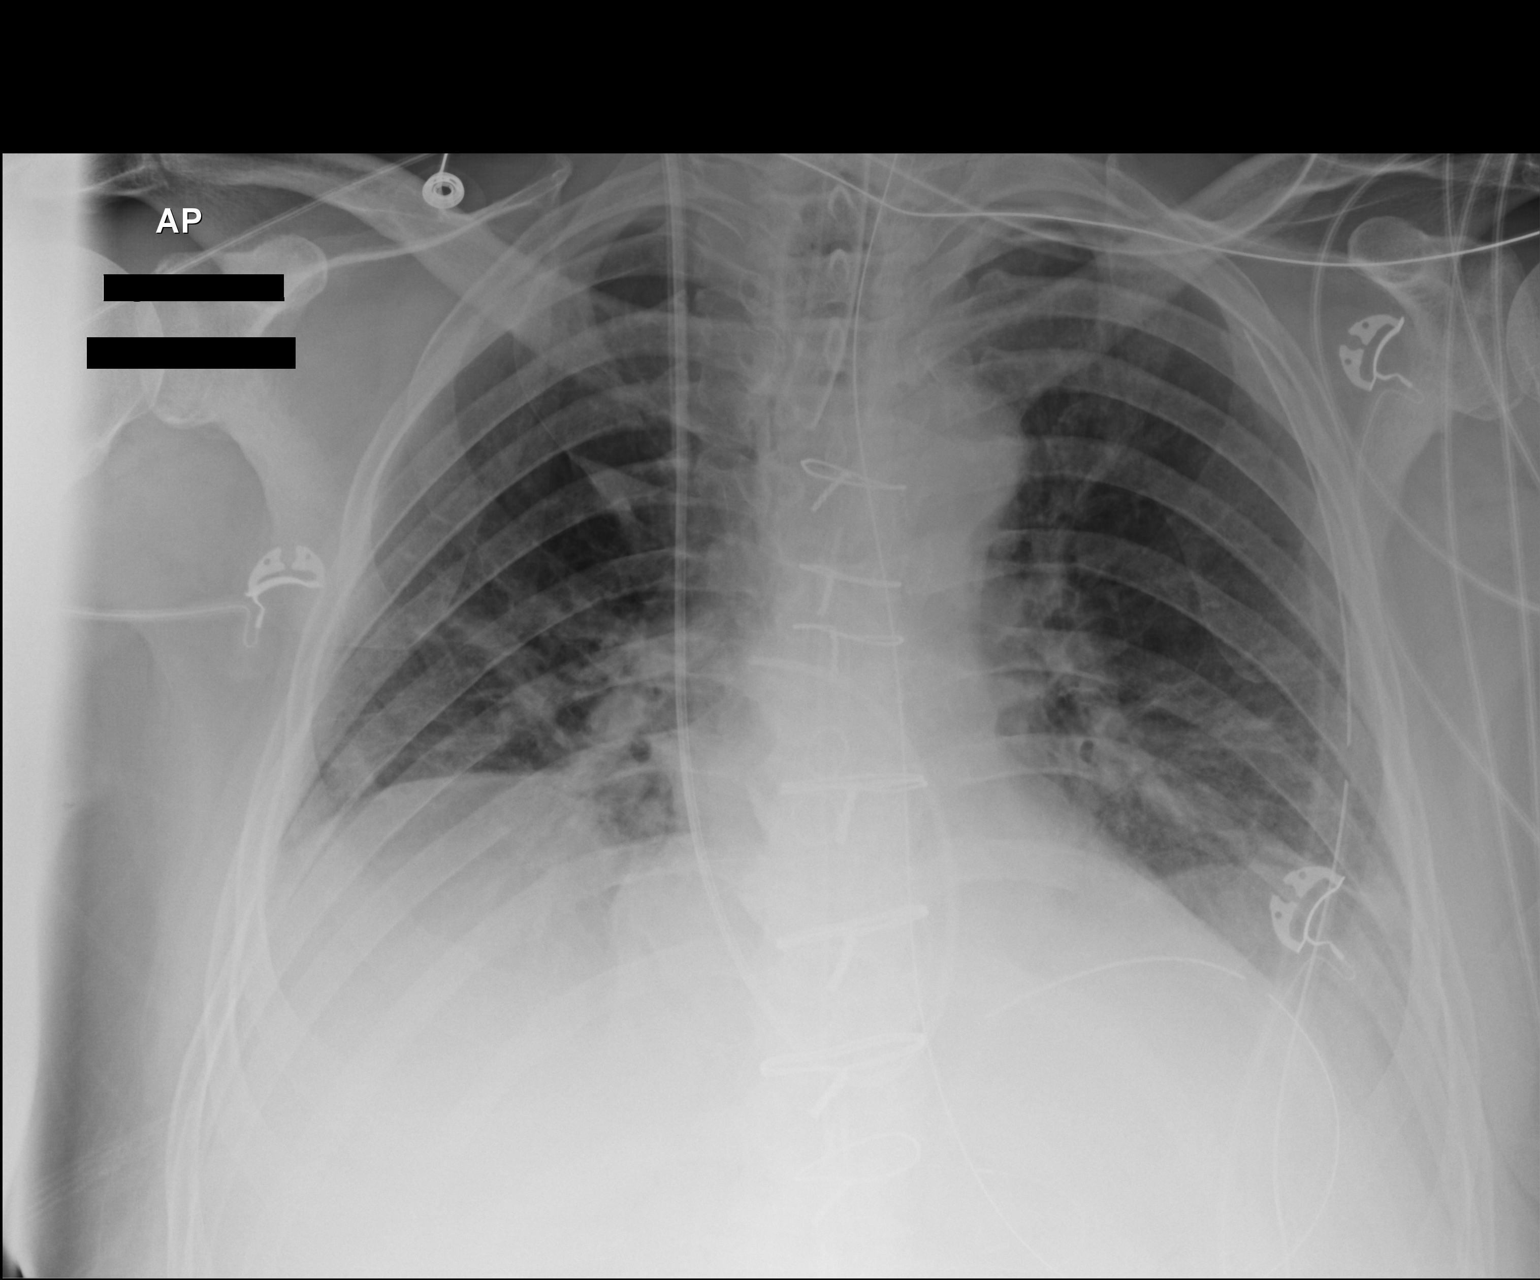

[1 of 1 positions shown; findings below may reference images not displayed]

FINDINGS: 1618 hours. Endotracheal tube tip remains in the mid trachea.
Nasogastric tube projects below the diaphragm. Right IJ Swan-Ganz
catheter tip in the right interlobar pulmonary artery. Lateral left
chest tube is unchanged in position.

The heart size and mediastinal contours are stable. There is
improved aeration of both lung bases. No pneumothorax or significant
pleural effusion identified.
IMPRESSION: Improving bibasilar aeration. No pneumothorax. Stable support
system.

## 2017-10-15 IMAGING — CR DG CHEST 1V PORT
1 series · 1 of 1 positions shown · non-contrast
Comparison: 12/19/2016.

CLINICAL DATA: Chest tube.

EXAM:
PORTABLE CHEST 1 VIEW

[AP]
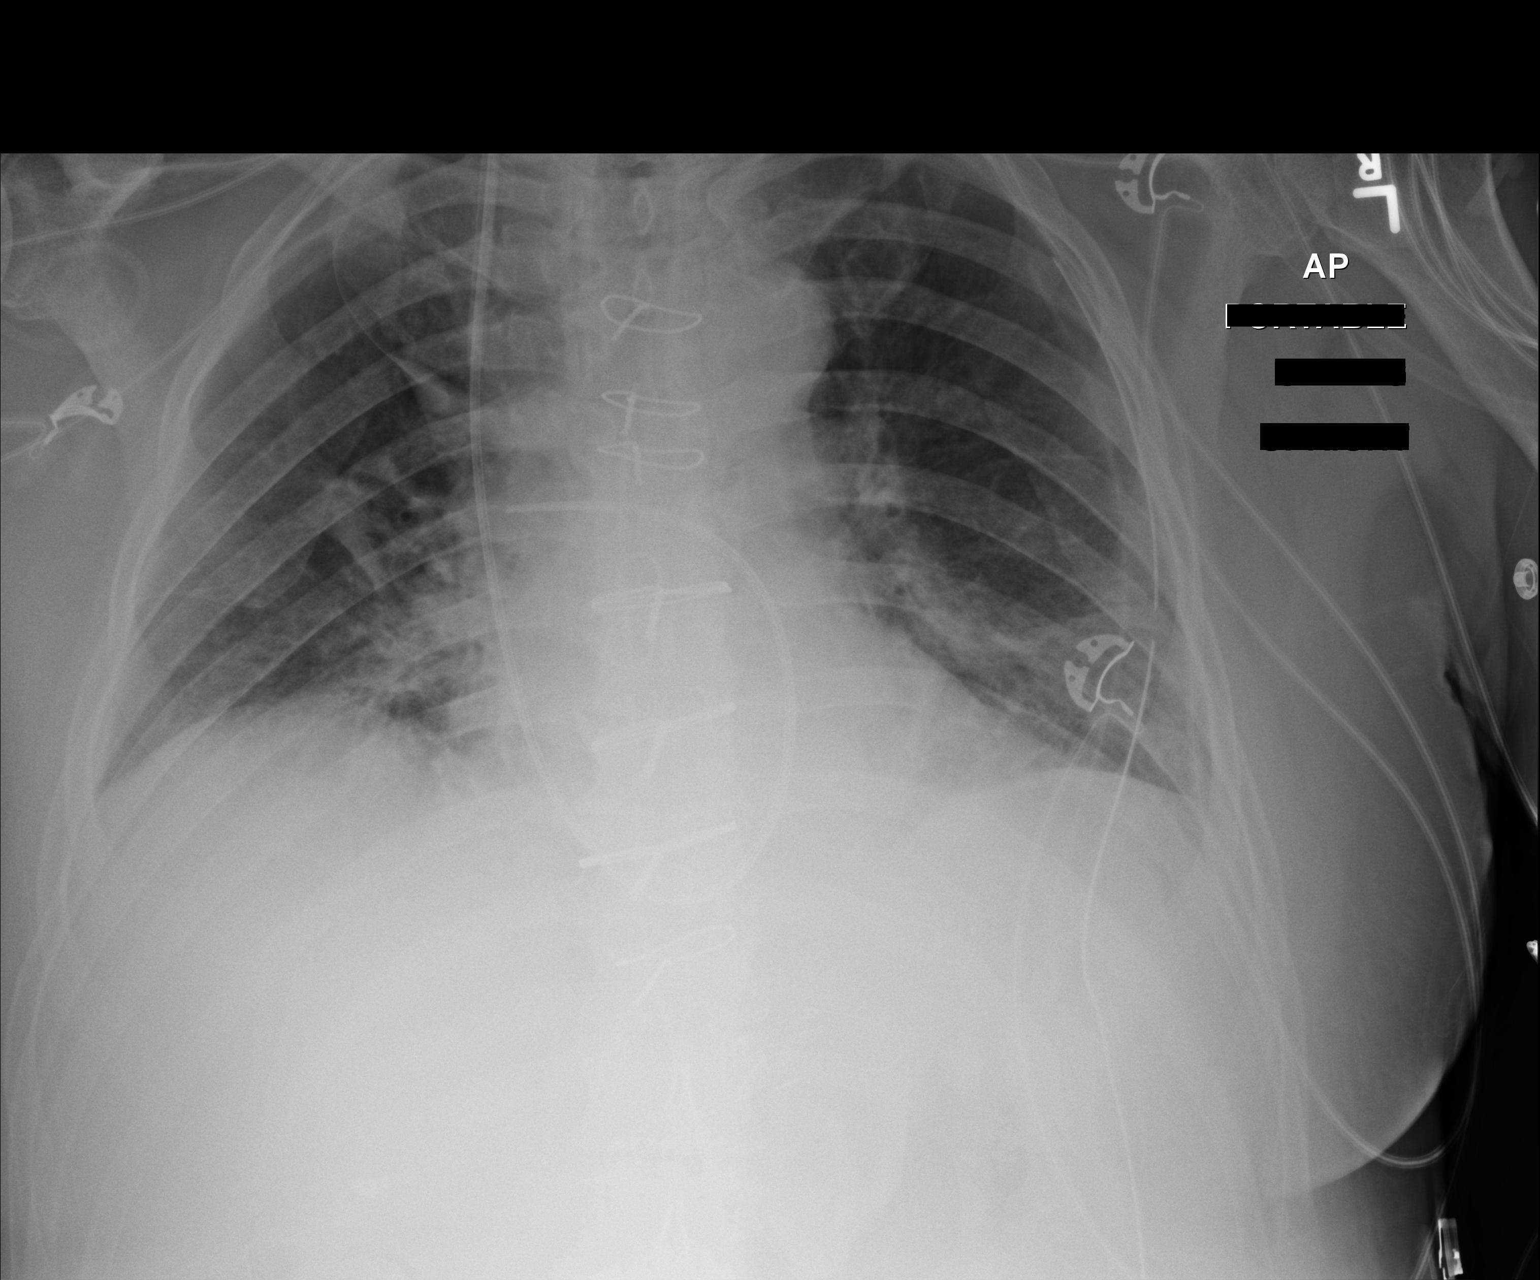

[1 of 1 positions shown; findings below may reference images not displayed]

FINDINGS: Interim extubation removal of NG tube. Swan-Ganz catheter left chest
tube in stable position. No pneumothorax. Prior CABG. Heart size
stable. Low lung volumes with basilar atelectasis again noted
without interim change. No prominent pleural effusion.
IMPRESSION: 1. Interim extubation and removal of NG tube. Swan-Ganz catheter and
left chest tube in stable position. No pneumothorax.

2.  Stable bibasilar atelectasis.

3. Prior CABG.  Heart size stable.

## 2017-10-29 DIAGNOSIS — K219 Gastro-esophageal reflux disease without esophagitis: Secondary | ICD-10-CM | POA: Diagnosis not present

## 2017-10-29 DIAGNOSIS — R079 Chest pain, unspecified: Secondary | ICD-10-CM | POA: Diagnosis not present

## 2017-11-06 ENCOUNTER — Ambulatory Visit (INDEPENDENT_AMBULATORY_CARE_PROVIDER_SITE_OTHER): Payer: Medicare Other | Admitting: Cardiology

## 2017-11-06 ENCOUNTER — Encounter: Payer: Self-pay | Admitting: Cardiology

## 2017-11-06 VITALS — BP 106/70 | HR 70 | Ht 69.0 in | Wt 203.0 lb

## 2017-11-06 DIAGNOSIS — Z951 Presence of aortocoronary bypass graft: Secondary | ICD-10-CM | POA: Diagnosis not present

## 2017-11-06 DIAGNOSIS — I10 Essential (primary) hypertension: Secondary | ICD-10-CM | POA: Diagnosis not present

## 2017-11-06 DIAGNOSIS — I2510 Atherosclerotic heart disease of native coronary artery without angina pectoris: Secondary | ICD-10-CM | POA: Diagnosis not present

## 2017-11-06 DIAGNOSIS — E119 Type 2 diabetes mellitus without complications: Secondary | ICD-10-CM | POA: Diagnosis not present

## 2017-11-06 DIAGNOSIS — I209 Angina pectoris, unspecified: Secondary | ICD-10-CM | POA: Diagnosis not present

## 2017-11-06 MED ORDER — NITROGLYCERIN 0.4 MG SL SUBL: 0.4000 mg | SUBLINGUAL_TABLET | SUBLINGUAL | 11 refills | Status: DC | PRN

## 2017-11-06 MED ORDER — ISOSORBIDE MONONITRATE ER 60 MG PO TB24: 60.0000 mg | ORAL_TABLET | Freq: Every day | ORAL | 3 refills | Status: DC

## 2017-11-06 NOTE — Progress Notes (Signed)
Cardiology Office Note:    Date:  11/06/2017   ID:  Andrew Williamson, DOB November 10, 1946, MRN [id number]  PCP:  Lujean Amel, MD  Cardiologist:  Candee Furbish, MD    Referring MD: Lujean Amel, MD     History of Present Illness:    Andrew Williamson is a 71 y.o. male with CAD status post CABG 12/2016 by Dr. Servando Snare, LIMA to LAD, SVG to ramus, SVG to circumflex, with hypertension diabetes obesity history of prostate cancer here for follow-up.  Had a nuclear stress test for exertional chest discomfort which showed inferolateral ischemia. We decided to continue with medical management. Overall he has been doing very well. Occasionally will have epigastric discomfort that is relieved with burping. Clearly GI. Sometimes it is hard for him to distinguish this from typical angina. Prilosec being taken.  More like gastric burning in breast bone with sharper pain in bottom of throat. Gym bike for 20 min, treat 70min. Burping did not resolve the issue. About 3pm he remembered he forgot.   His metoprolol was increased.  Past Medical History:  Diagnosis Date  . Allergic rhinitis   . Coronary artery disease involving native coronary artery of native heart without angina pectoris 01/07/2017   a - Myoview 12/17:  EF 55, inf-lateral ischemia; intermediate risk // b - LHC 12/05/16: oLM 50, oLAD 40, dLAD 50, RI 25, oLCx 95, mLCx 95, RCA luminal irregs // c - Echo 12/17: EF 55-60, no RWMA, Gr 1 DD // d. 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 DJD (degenerative joint disease)    hips  . ED (erectile dysfunction)   . GERD (gastroesophageal reflux disease)   . Gynecomastia, male   . History of echocardiogram    Echo 12/17: EF 55-60, normal wall motion, grade 1 diastolic dysfunction  . History of nuclear stress test    Myoview 12/17: EF 55, inf-lateral ischemia; intermediate risk  . HLD (hyperlipidemia)    hx of ? elevated LFTs on  statin Rx  . Hypertension   . Obesity   . Prostate cancer [facility])    s/p radical prostatectomy  . Spinal stenosis     Past Surgical History:  Procedure Laterality Date  . CARDIAC CATHETERIZATION N/A 12/05/2016   Procedure: Left Heart Cath and Coronary Angiography;  Surgeon: Jettie Booze, MD;  Location: [facility];  Service: Cardiovascular;  Laterality: N/A;  . CARDIAC CATHETERIZATION N/A 12/05/2016   Procedure: Intravascular Ultrasound/IVUS;  Surgeon: Jettie Booze, MD;  Location: [facility];  Service: Cardiovascular;  Laterality: N/A;  . COLONOSCOPY  06/2014   FU 10 years  . CORONARY ARTERY BYPASS GRAFT N/A 12/19/2016   Procedure: CORONARY ARTERY BYPASS GRAFTING (CABG) x three , using left internal mammary artery and right leg greater saphenous vein harvested endoscopically; SVG to Ramus, SVG to circ, LIMA to LAD ;  Surgeon: [name], MD;  Location: [facility];  Service: Open Heart Surgery;  Laterality: N/A;  . LAPAROSCOPIC CHOLECYSTECTOMY     2012  . ROBOT ASSISTED LAPAROSCOPIC RADICAL PROSTATECTOMY N/A 08/11/2015   Procedure: ROBOTIC ASSISTED LAPAROSCOPIC RADICAL PROSTATECTOMY;  Surgeon: Irine Seal, MD;  Location: [facility];  Service: Urology;  Laterality: N/A;  . TEE WITHOUT CARDIOVERSION N/A 12/19/2016   Procedure: TRANSESOPHAGEAL ECHOCARDIOGRAM (TEE);  Surgeon: [name], MD;  Location: [facility];  Service: Open Heart Surgery;  Laterality: N/A;    Current Medications:  Current Meds  Medication Sig  . aspirin EC 81 MG tablet Take 1 tablet (81 mg total) by mouth daily.  Marland Kitchen gabapentin (NEURONTIN) 300 MG capsule Take 300 mg by mouth 3 (three) times daily.   . isosorbide mononitrate (IMDUR) 30 MG 24 hr tablet Take 1 tablet (30 mg total) by mouth daily.  Marland Kitchen lisinopril (PRINIVIL,ZESTRIL) 5 MG tablet Take 1 tablet (5 mg total) by mouth daily.  . metFORMIN (GLUCOPHAGE) 1000 MG tablet Take 1,000 mg by mouth 2 (two) times daily with a meal.  . metoprolol  (LOPRESSOR) 50 MG tablet Take 1.5 tablets (75 mg total) by mouth 2 (two) times daily.  . Multiple Vitamins-Minerals (CENTRUM SILVER PO) Take 1 tablet by mouth daily.  Marland Kitchen omeprazole (PRILOSEC) 40 MG capsule Take 40 mg by mouth 2 (two) times daily.  . rosuvastatin (CRESTOR) 20 MG tablet Take 20 mg by mouth daily.      Allergies:   Thimerosal   Social History   Socioeconomic History  . Marital status: Widowed    Spouse name: None  . Number of children: None  . Years of education: None  . Highest education level: None  Social Needs  . Financial resource strain: None  . Food insecurity - worry: None  . Food insecurity - inability: None  . Transportation needs - medical: None  . Transportation needs - non-medical: None  Occupational History  . None  Tobacco Use  . Smoking status: Never Smoker  . Smokeless tobacco: Current User    Types: Chew  Substance and Sexual Activity  . Alcohol use: Yes    Comment: rarely  . Drug use: No  . Sexual activity: None  Other Topics Concern  . 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 ROS:   Please see the history of present illness.     All other systems reviewed and are negative.  EKGs/Labs/Other Studies Reviewed:    The following studies were reviewed today: Myoview 03/28/17 EF 59, inf-lat ischemia, 1 mm J point depression >> down-sloping at peak exercise; High Risk  Carotid US 12/17/16 Bilateral: intimal wall thickening CCA. 1-39% ICA plaquing.  LHC 12/05/16 >> CABG LM ostial 50 LAD ostial 40, distal 50 RI  25 LCx ostial 95, mid 95 RCA luminal irregs EF 55-65  Echo 12/17:  EF 55-60, normal wall motion, grade 1 diastolic dysfunction  Myoview 12/17: EF 55, inf-lateral ischemia; intermediate risk  EKG:  EKG is none ordered today.    Recent Labs: 12/17/2016: ALT 19 12/20/2016: Magnesium 2.3 12/22/2016: Hemoglobin 12.6; Platelets 151 12/24/2016: BUN 21; Creatinine, Ser 1.09; Potassium 3.8; Sodium 139   Recent Lipid Panel No results found for: CHOL, TRIG, HDL, CHOLHDL, VLDL, LDLCALC, LDLDIRECT  Physical Exam:    VS:  BP 106/70   Pulse 70   Ht 5\' 9"  (1.753 m)   Wt 203 lb (92.1 kg)   SpO2 97%   BMI 29.98 kg/m     Wt Readings from Last 3 Encounters:  11/06/17 203 lb (92.1 kg)  05/10/17 208 lb (94.3 kg)  04/19/17 207 lb 7.3 oz (94.1 kg)     GEN:  Well nourished, well developed in no acute distress HEENT: Normal NECK: No JVD; No carotid bruits LYMPHATICS: No lymphadenopathy CARDIAC: RRR, no murmurs, rubs, gallops RESPIRATORY:  Clear to auscultation without rales, wheezing or rhonchi  ABDOMEN: Soft, non-tender, non-distended, overweight MUSCULOSKELETAL:  No edema; No deformity  SKIN: Warm and dry NEUROLOGIC:  Alert and oriented x 3 PSYCHIATRIC:  Normal affect   ASSESSMENT:    No diagnosis found. PLAN:    In order of problems listed above:  Coronary artery disease/angina, post bypass  - Has been feeling better since increasing the metoprolol to 75  - No changes made. Continue with isosorbide and other secondary prevention.  We will increase his Imdur to 60 from 30.  -  He is going to the gym almost daily.  Some days he bowls and on Wednesdays he has breakfast with his golfing buddies.  Essential hypertension  - Good overall blood pressure control.  Doing well.  Carotid plaque bilaterally  - 1-39% on pre CABG Doppler. Bilaterally  - Secondary prevention.   GERD  -Prilosec previously now on Dexilant.  Clearly has some symptoms that are related to GERD that are  relieved with burping.  Dr. Michail Sermon is going to perform an endoscopy.  Hyperlipidemia  - Continue statin.  6 month follow-up  Medication Adjustments/Labs and Tests Ordered: Current medicines are reviewed at length with the patient today.  Concerns regarding medicines are outlined above. Labs and tests ordered and medication changes are outlined in the patient instructions below:  There are no Patient Instructions on file for this visit.   Signed, Candee Furbish, MD  11/06/2017 2:17 PM    [facility]

## 2017-11-06 NOTE — Patient Instructions (Signed)
Medication Instructions:  Please increase your Imdur to 60 mg a day. Continue all other medications as listed. You may use SL Nitroglycerin as needed for chest pain.  Follow-Up: Follow up in 6 months with Dr. Marlou Porch.  You will receive a letter in the mail 2 months before you are due.  Please call us when you receive this letter to schedule your follow up appointment.  If you need a refill on your cardiac medications before your next appointment, please call your pharmacy.  Thank you for choosing [facility]!!    Nitroglycerin sublingual tablets What is this medicine? NITROGLYCERIN (nye troe GLI ser in) is a type of vasodilator. It relaxes blood vessels, increasing the blood and oxygen supply to your heart. This medicine is used to relieve chest pain caused by angina. It is also used to prevent chest pain before activities like climbing stairs, going outdoors in cold weather, or sexual activity. This medicine may be used for other purposes; ask your health care provider or pharmacist if you have questions. COMMON BRAND NAME(S): Nitroquick, Nitrostat, Nitrotab What should I tell my health care provider before I take this medicine? They need to know if you have any of these conditions: -anemia -head injury, recent stroke, or bleeding in the brain -liver disease -previous heart attack -an unusual or allergic reaction to nitroglycerin, other medicines, foods, dyes, or preservatives -pregnant or trying to get pregnant -breast-feeding How should I use this medicine? Take this medicine by mouth as needed. At the first sign of an angina attack (chest pain or tightness) place one tablet under your tongue. You can also take this medicine 5 to 10 minutes before an event likely to produce chest pain. Follow the directions on the prescription label. Let the tablet dissolve under the tongue. Do not swallow whole. Replace the dose if you accidentally swallow it. It will help if your mouth is  not dry. Saliva around the tablet will help it to dissolve more quickly. Do not eat or drink, smoke or chew tobacco while a tablet is dissolving. If you are not better within 5 minutes after taking ONE dose of nitroglycerin, call 9-1-1 immediately to seek emergency medical care. Do not take more than 3 nitroglycerin tablets over 15 minutes. If you take this medicine often to relieve symptoms of angina, your doctor or health care professional may provide you with different instructions to manage your symptoms. If symptoms do not go away after following these instructions, it is important to call 9-1-1 immediately. Do not take more than 3 nitroglycerin tablets over 15 minutes. Talk to your pediatrician regarding the use of this medicine in children. Special care may be needed. Overdosage: If you think you have taken too much of this medicine contact a poison control center or emergency room at once. NOTE: This medicine is only for you. Do not share this medicine with others. What if I miss a dose? This does not apply. This medicine is only used as needed. What may interact with this medicine? Do not take this medicine with any of the following medications: -certain migraine medicines like ergotamine and dihydroergotamine (DHE) -medicines used to treat erectile dysfunction like sildenafil, tadalafil, and vardenafil -riociguat This medicine may also interact with the following medications: -alteplase -aspirin -heparin -medicines for high blood pressure -medicines for mental depression -other medicines used to treat angina -phenothiazines like chlorpromazine, mesoridazine, prochlorperazine, thioridazine This list may not describe all possible interactions. Give your health care provider a list of all the  medicines, herbs, non-prescription drugs, or dietary supplements you use. Also tell them if you smoke, drink alcohol, or use illegal drugs. Some items may interact with your medicine. What should I  watch for while using this medicine? Tell your doctor or health care professional if you feel your medicine is no longer working. Keep this medicine with you at all times. Sit or lie down when you take your medicine to prevent falling if you feel dizzy or faint after using it. Try to remain calm. This will help you to feel better faster. If you feel dizzy, take several deep breaths and lie down with your feet propped up, or bend forward with your head resting between your knees. You may get drowsy or dizzy. Do not drive, use machinery, or do anything that needs mental alertness until you know how this drug affects you. Do not stand or sit up quickly, especially if you are an older patient. This reduces the risk of dizzy or fainting spells. Alcohol can make you more drowsy and dizzy. Avoid alcoholic drinks. Do not treat yourself for coughs, colds, or pain while you are taking this medicine without asking your doctor or health care professional for advice. Some ingredients may increase your blood pressure. What side effects may I notice from receiving this medicine? Side effects that you should report to your doctor or health care professional as soon as possible: -blurred vision -dry mouth -skin rash -sweating -the feeling of extreme pressure in the head -unusually weak or tired Side effects that usually do not require medical attention (report to your doctor or health care professional if they continue or are bothersome): -flushing of the face or neck -headache -irregular heartbeat, palpitations -nausea, vomiting This list may not describe all possible side effects. Call your doctor for medical advice about side effects. You may report side effects to FDA at 1-800-FDA-1088. Where should I keep my medicine? Keep out of the reach of children. Store at room temperature between 20 and 25 degrees C (68 and 77 degrees F). Store in [facility] of [employer]. Protect from light and moisture. Keep tightly  closed. Throw away any unused medicine after the expiration date. NOTE: This sheet is a summary. It may not cover all possible information. If you have questions about this medicine, talk to your doctor, pharmacist, or health care provider.  2018 Elsevier/Gold Standard (2013-09-24 17:57:36)

## 2017-11-07 ENCOUNTER — Encounter: Payer: Self-pay | Admitting: Cardiology

## 2017-11-28 DIAGNOSIS — K219 Gastro-esophageal reflux disease without esophagitis: Secondary | ICD-10-CM | POA: Diagnosis not present

## 2017-11-28 DIAGNOSIS — K3189 Other diseases of stomach and duodenum: Secondary | ICD-10-CM | POA: Diagnosis not present

## 2017-11-28 DIAGNOSIS — R12 Heartburn: Secondary | ICD-10-CM | POA: Diagnosis not present

## 2017-12-19 DIAGNOSIS — S61012A Laceration without foreign body of left thumb without damage to nail, initial encounter: Secondary | ICD-10-CM | POA: Diagnosis not present

## 2018-01-10 DIAGNOSIS — Z8546 Personal history of malignant neoplasm of prostate: Secondary | ICD-10-CM | POA: Diagnosis not present

## 2018-01-15 DIAGNOSIS — E291 Testicular hypofunction: Secondary | ICD-10-CM | POA: Diagnosis not present

## 2018-01-15 DIAGNOSIS — N393 Stress incontinence (female) (male): Secondary | ICD-10-CM | POA: Diagnosis not present

## 2018-01-15 DIAGNOSIS — N5231 Erectile dysfunction following radical prostatectomy: Secondary | ICD-10-CM | POA: Diagnosis not present

## 2018-01-15 DIAGNOSIS — Z8546 Personal history of malignant neoplasm of prostate: Secondary | ICD-10-CM | POA: Diagnosis not present

## 2018-02-11 DIAGNOSIS — Z79899 Other long term (current) drug therapy: Secondary | ICD-10-CM | POA: Diagnosis not present

## 2018-02-11 DIAGNOSIS — Z0001 Encounter for general adult medical examination with abnormal findings: Secondary | ICD-10-CM | POA: Diagnosis not present

## 2018-02-11 DIAGNOSIS — K219 Gastro-esophageal reflux disease without esophagitis: Secondary | ICD-10-CM | POA: Diagnosis not present

## 2018-02-11 DIAGNOSIS — E119 Type 2 diabetes mellitus without complications: Secondary | ICD-10-CM | POA: Diagnosis not present

## 2018-02-11 DIAGNOSIS — Z7984 Long term (current) use of oral hypoglycemic drugs: Secondary | ICD-10-CM | POA: Diagnosis not present

## 2018-02-11 DIAGNOSIS — I10 Essential (primary) hypertension: Secondary | ICD-10-CM | POA: Diagnosis not present

## 2018-02-11 DIAGNOSIS — E7800 Pure hypercholesterolemia, unspecified: Secondary | ICD-10-CM | POA: Diagnosis not present

## 2018-02-11 DIAGNOSIS — I2510 Atherosclerotic heart disease of native coronary artery without angina pectoris: Secondary | ICD-10-CM | POA: Diagnosis not present

## 2018-03-20 DIAGNOSIS — L821 Other seborrheic keratosis: Secondary | ICD-10-CM | POA: Diagnosis not present

## 2018-03-20 DIAGNOSIS — D485 Neoplasm of uncertain behavior of skin: Secondary | ICD-10-CM | POA: Diagnosis not present

## 2018-03-20 DIAGNOSIS — L570 Actinic keratosis: Secondary | ICD-10-CM | POA: Diagnosis not present

## 2018-03-20 DIAGNOSIS — D0421 Carcinoma in situ of skin of right ear and external auricular canal: Secondary | ICD-10-CM | POA: Diagnosis not present

## 2018-03-28 ENCOUNTER — Other Ambulatory Visit: Payer: Self-pay | Admitting: Physician Assistant

## 2018-05-15 ENCOUNTER — Encounter: Payer: Self-pay | Admitting: Cardiology

## 2018-05-15 ENCOUNTER — Ambulatory Visit (INDEPENDENT_AMBULATORY_CARE_PROVIDER_SITE_OTHER): Payer: Medicare Other | Admitting: Cardiology

## 2018-05-15 VITALS — BP 120/64 | HR 66 | Ht 69.0 in | Wt 197.8 lb

## 2018-05-15 DIAGNOSIS — E782 Mixed hyperlipidemia: Secondary | ICD-10-CM

## 2018-05-15 DIAGNOSIS — I2510 Atherosclerotic heart disease of native coronary artery without angina pectoris: Secondary | ICD-10-CM

## 2018-05-15 DIAGNOSIS — E119 Type 2 diabetes mellitus without complications: Secondary | ICD-10-CM | POA: Diagnosis not present

## 2018-05-15 DIAGNOSIS — Z951 Presence of aortocoronary bypass graft: Secondary | ICD-10-CM

## 2018-05-15 DIAGNOSIS — I209 Angina pectoris, unspecified: Secondary | ICD-10-CM

## 2018-05-15 NOTE — Patient Instructions (Signed)

## 2018-05-15 NOTE — Progress Notes (Signed)
Cardiology Office Note:    Date:  05/15/2018   ID:  Andrew Williamson, DOB 1946-09-29, MRN [id number]  PCP:  Lujean Amel, MD  Cardiologist:  Candee Furbish, MD    Referring MD: Lujean Amel, MD     History of Present Illness:    Andrew Williamson is a 71 y.o. male with CAD status post CABG 12/2016 by Dr. Servando Snare, LIMA to LAD, SVG to ramus, SVG to circumflex, with hypertension diabetes obesity history of prostate cancer here for follow-up.  Had a nuclear stress test for exertional chest discomfort which showed inferolateral ischemia. We decided to continue with medical management. Overall he has been doing very well at the gym. Occasionally will have epigastric discomfort that is relieved with burping. Clearly GI. Sometimes it is hard for him to distinguish this from typical angina. Prilosec being taken.  More like gastric burning in breast bone with sharper pain in bottom of throat. Gym bike for 20 min, treat 53min. Burping did not resolve the issue. About 3pm he remembered he forgot.   His metoprolol was increased.  If burps - eases off.  Once again discussed very similar symptoms as to above.  Anxious about this.  Past Medical History:  Diagnosis Date  . Allergic rhinitis   . Coronary artery disease involving native coronary artery of native heart without angina pectoris 01/07/2017   a - Myoview 12/17:  EF 55, inf-lateral ischemia; intermediate risk // b - LHC 12/05/16: oLM 50, oLAD 40, dLAD 50, RI 25, oLCx 95, mLCx 95, RCA luminal irregs // c - Echo 12/17: EF 55-60, no RWMA, Gr 1 DD // d. 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 DJD (degenerative joint disease)    hips  . ED (erectile dysfunction)   . GERD (gastroesophageal reflux disease)   . Gynecomastia, male   . History of echocardiogram    Echo 12/17: EF 55-60, normal wall motion, grade 1 diastolic dysfunction  . History of nuclear stress test    Myoview 12/17: EF 55, inf-lateral ischemia; intermediate risk  . HLD (hyperlipidemia)    hx of ? elevated LFTs on statin Rx  . Hypertension   . Obesity   . Prostate cancer [facility])    s/p radical prostatectomy  . Spinal stenosis     Past Surgical History:  Procedure Laterality Date  . CARDIAC CATHETERIZATION N/A 12/05/2016   Procedure: Left Heart Cath and Coronary Angiography;  Surgeon: Jettie Booze, MD;  Location: [place] [facility];  Service: Cardiovascular;  Laterality: N/A;  . CARDIAC CATHETERIZATION N/A 12/05/2016   Procedure: Intravascular Ultrasound/IVUS;  Surgeon: Jettie Booze, MD;  Location: [place] [facility];  Service: Cardiovascular;  Laterality: N/A;  . COLONOSCOPY  06/2014   FU 10 years  . CORONARY ARTERY BYPASS GRAFT N/A 12/19/2016   Procedure: CORONARY ARTERY BYPASS GRAFTING (CABG) x three , using left internal mammary artery and right leg greater saphenous vein harvested endoscopically; SVG to Ramus, SVG to circ, LIMA to LAD ;  Surgeon: [name], MD;  Location: [facility];  Service: Open Heart Surgery;  Laterality: N/A;  . LAPAROSCOPIC CHOLECYSTECTOMY     2012  . ROBOT ASSISTED LAPAROSCOPIC RADICAL PROSTATECTOMY N/A 08/11/2015   Procedure: ROBOTIC ASSISTED LAPAROSCOPIC RADICAL PROSTATECTOMY;  Surgeon: Irine Seal, MD;  Location: [facility];  Service: Urology;  Laterality: N/A;  . TEE WITHOUT CARDIOVERSION N/A 12/19/2016   Procedure: TRANSESOPHAGEAL ECHOCARDIOGRAM (TEE);  Surgeon: [name], MD;  Location: [facility];  Service: Open Heart Surgery;  Laterality: N/A;    Current Medications: Current Meds  Medication Sig  . aspirin EC 81 MG tablet Take 1 tablet (81 mg total) by mouth daily.  . lansoprazole (PREVACID) 30 MG capsule Take 1 capsule by mouth daily.  Marland Kitchen lisinopril (PRINIVIL,ZESTRIL) 5 MG tablet Take 1 tablet (5 mg total) by mouth daily.  . metFORMIN (GLUCOPHAGE) 1000 MG tablet Take 1,000 mg by mouth 2 (two) times daily with a meal.  . metoprolol  tartrate (LOPRESSOR) 50 MG tablet Take 1.5 tablets (75 mg total) by mouth 2 (two) times daily.  . nitroGLYCERIN (NITROSTAT) 0.4 MG SL tablet Place 1 tablet (0.4 mg total) under the tongue every 5 (five) minutes as needed for chest pain.  . rosuvastatin (CRESTOR) 20 MG tablet Take 20 mg by mouth daily.      Allergies:   Thimerosal   Social History   Socioeconomic History  . Marital status: Widowed    Spouse name: Not on file  . Number of children: Not on file  . Years of education: Not on file  . Highest education level: Not on file  Occupational History  . Not on file  Social Needs  . Financial resource strain: Not on file  . Food insecurity:    Worry: Not on file    Inability: Not on file  . Transportation needs:    Medical: Not on file    Non-medical: Not on file  Tobacco Use  . Smoking status: Never Smoker  . Smokeless tobacco: Current User    Types: Chew  Substance and Sexual Activity  . Alcohol use: Yes    Comment: rarely  . Drug use: No  . Sexual activity: Not on file  Lifestyle  . Physical activity:    Days per week: Not on file    Minutes per session: Not on file  . Stress: Not on file  Relationships  . Social connections:    Talks on phone: Not on file    Gets together: Not on file    Attends religious service: Not on file    Active member of club or organization: Not on file    Attends meetings of clubs or organizations: Not on file    Relationship status: Not on file  Other Topics Concern  . 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 ROS:   Please see the history of present illness.    All other review of systems negative  EKGs/Labs/Other Studies Reviewed:    The following studies were reviewed today:  Myoview 03/28/17 EF 59, inf-lat ischemia, 1 mm J point depression >> down-sloping at peak exercise; High Risk  Carotid US 12/17/16 Bilateral: intimal wall thickening CCA. 1-39% ICA plaquing.  LHC 12/05/16 >> CABG LM ostial 50 LAD ostial 40, distal 50 RI 25 LCx ostial 95, mid 95 RCA luminal irregs EF 55-65  Echo 12/17:  EF 55-60, normal wall motion, grade 1  diastolic dysfunction  Myoview 12/17: EF 55, inf-lateral ischemia; intermediate risk  EKG: Sinus rhythm 66 with premature atrial complex noted.  Personally viewed.  Recent Labs: No results found for requested labs within last 8760 hours.   Recent Lipid Panel No results found for: CHOL, TRIG, HDL, CHOLHDL, VLDL, LDLCALC, LDLDIRECT  Physical Exam:    VS:  BP 120/64   Pulse 66   Ht 5\' 9"  (1.753 m)   Wt 197 lb 12.8 oz (89.7 kg)   BMI 29.21 kg/m     Wt Readings from Last 3 Encounters:  05/15/18 197 lb 12.8 oz (89.7 kg)  11/06/17 203 lb (92.1 kg)  05/10/17 208 lb (94.3 kg)    GEN: Well nourished, well developed, in no acute distress  HEENT: normal  Neck: no JVD, carotid bruits, or masses Cardiac: RRR; no murmurs, rubs, or gallops,no edema  Respiratory:  clear to auscultation bilaterally, normal work of breathing GI: soft, nontender, nondistended, + BS MS: no deformity or atrophy  Skin: warm and dry, no rash Neuro:  Alert and Oriented x 3, Strength and sensation are intact Psych: euthymic mood, full affect   ASSESSMENT:    1. S/P CABG x 3   2. Angina pectoris ([place])   3. Type 2 diabetes mellitus without complication, without long-term current use of insulin ([place])   4. Mixed hyperlipidemia   5. Coronary artery disease involving native coronary artery of native heart without angina pectoris    PLAN:    In order of  problems listed above:  Coronary artery disease/angina, post bypass  -Still struggles at times with figuring out whether or not he is having anginal symptoms or GERD symptoms.  Clearly he states that sometimes he will lean forward and burp and his symptoms will be relieved.  However, sometimes when he is square dancing, he has to stop because he feels some discomfort.  He then gets back out thereafter and does fine.  He also has been going to the spin class and treadmill and doing well without limitations.  -Continue with metoprolol, isosorbide  -If his symptoms were to progress, we will entertain diagnostic angiogram.   Essential hypertension  - Good overall blood pressure control.  Doing well.  Well controlled.  Carotid plaque bilaterally  - 1-39% on pre CABG Doppler. Bilaterally  - Secondary prevention.  Statin.  GERD  -Prilosec - Dexilant - back to other PPI.  Clearly has some symptoms that are related to GERD that are relieved with burping.  Dr. Michail Sermon endoscopy GERD.  Hyperlipidemia  - Continue statin.  LDL 35.  Excellent.  He is doing very good job with prevention strategies.  6 month follow-up  Medication Adjustments/Labs and Tests Ordered: Current medicines are reviewed at length with the patient today.  Concerns regarding medicines are outlined above. Labs and tests ordered and medication changes are outlined in the patient instructions below:  Patient Instructions  Medication Instructions:  The current medical regimen is effective;  continue present plan and medications.  Follow-Up: Follow up in 6 months with Dr. Marlou Porch.  You will receive a letter in the mail 2 months before you are due.  Please call us when you receive this letter to schedule your follow up appointment.  If you need a refill on your cardiac medications before your next appointment, please call your pharmacy.  Thank you for choosing [facility]!!         Signed, Candee Furbish, MD    05/15/2018 10:21 AM  [facility]

## 2018-05-17 IMAGING — NM NM MISC PROCEDURE
8 series · 48 of 48 positions shown · non-contrast
Comparison: none

[Series 1: rest · 6.51mm/px · 6 of 64 frames shown (1 of 2)]
[frame 6/64]
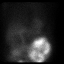
[frame 16/64]
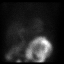
[frame 27/64]
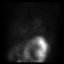
[frame 38/64]
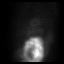
[frame 48/64]
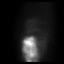
[frame 59/64]
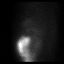

[Series 1: wbr_r-proj_st rest · 6.51mm/px · 6 of 64 frames shown (1 of 2)]
[frame 6/64]
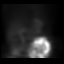
[frame 16/64]
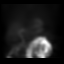
[frame 27/64]
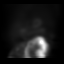
[frame 38/64]
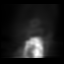
[frame 48/64]
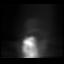
[frame 59/64]
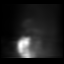

[Series 2: wbr_r-proj_st rest · 6.51mm/px · 6 of 64 frames shown (2 of 2)]
[frame 6/64]
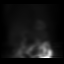
[frame 16/64]
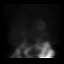
[frame 27/64]
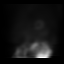
[frame 38/64]
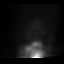
[frame 48/64]
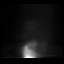
[frame 59/64]
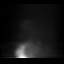

[Series 2: rest · 6.51mm/px · 6 of 64 frames shown (2 of 2)]
[frame 6/64]
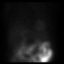
[frame 16/64]
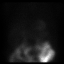
[frame 27/64]
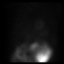
[frame 38/64]
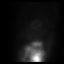
[frame 48/64]
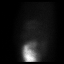
[frame 59/64]
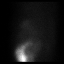

[Series 3: wbr_s-proj_st stress · 6.51mm/px · 6 of 64 frames shown (1 of 2)]
[frame 6/64]
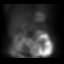
[frame 16/64]
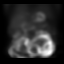
[frame 27/64]
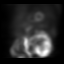
[frame 38/64]
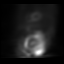
[frame 48/64]
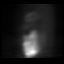
[frame 59/64]
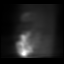

[Series 3: stress · 6.51mm/px · 6 of 512 frames shown (1 of 2)]
[frame 43/512]
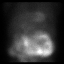
[frame 128/512]
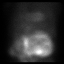
[frame 214/512]
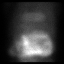
[frame 299/512]
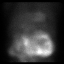
[frame 384/512]
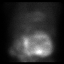
[frame 470/512]
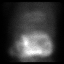

[Series 3: stress · 6.51mm/px · 6 of 64 frames shown (2 of 2)]
[frame 6/64]
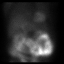
[frame 16/64]
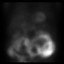
[frame 27/64]
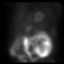
[frame 38/64]
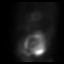
[frame 48/64]
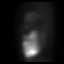
[frame 59/64]
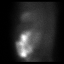

[Series 3: wbr_s-proj_st stress · 6.51mm/px · 6 of 512 frames shown (2 of 2)]
[frame 43/512]
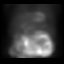
[frame 128/512]
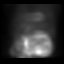
[frame 214/512]
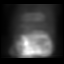
[frame 299/512]
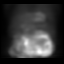
[frame 384/512]
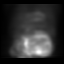
[frame 470/512]
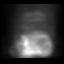

[48 of 48 positions shown; findings below may reference images not displayed]

Canned report from images found in remote index.

Refer to host system for actual result text.

## 2018-06-26 DIAGNOSIS — H01021 Squamous blepharitis right upper eyelid: Secondary | ICD-10-CM | POA: Diagnosis not present

## 2018-06-26 DIAGNOSIS — H2513 Age-related nuclear cataract, bilateral: Secondary | ICD-10-CM | POA: Diagnosis not present

## 2018-06-26 DIAGNOSIS — H01024 Squamous blepharitis left upper eyelid: Secondary | ICD-10-CM | POA: Diagnosis not present

## 2018-06-26 DIAGNOSIS — E119 Type 2 diabetes mellitus without complications: Secondary | ICD-10-CM | POA: Diagnosis not present

## 2018-07-10 DIAGNOSIS — Z8546 Personal history of malignant neoplasm of prostate: Secondary | ICD-10-CM | POA: Diagnosis not present

## 2018-07-17 DIAGNOSIS — N5231 Erectile dysfunction following radical prostatectomy: Secondary | ICD-10-CM | POA: Diagnosis not present

## 2018-07-17 DIAGNOSIS — E291 Testicular hypofunction: Secondary | ICD-10-CM | POA: Diagnosis not present

## 2018-07-17 DIAGNOSIS — Z8546 Personal history of malignant neoplasm of prostate: Secondary | ICD-10-CM | POA: Diagnosis not present

## 2018-07-17 DIAGNOSIS — N393 Stress incontinence (female) (male): Secondary | ICD-10-CM | POA: Diagnosis not present

## 2018-08-19 DIAGNOSIS — M25512 Pain in left shoulder: Secondary | ICD-10-CM | POA: Diagnosis not present

## 2018-08-19 DIAGNOSIS — E7800 Pure hypercholesterolemia, unspecified: Secondary | ICD-10-CM | POA: Diagnosis not present

## 2018-08-19 DIAGNOSIS — K219 Gastro-esophageal reflux disease without esophagitis: Secondary | ICD-10-CM | POA: Diagnosis not present

## 2018-08-19 DIAGNOSIS — Z23 Encounter for immunization: Secondary | ICD-10-CM | POA: Diagnosis not present

## 2018-08-19 DIAGNOSIS — E119 Type 2 diabetes mellitus without complications: Secondary | ICD-10-CM | POA: Diagnosis not present

## 2018-08-19 DIAGNOSIS — I2510 Atherosclerotic heart disease of native coronary artery without angina pectoris: Secondary | ICD-10-CM | POA: Diagnosis not present

## 2018-08-19 DIAGNOSIS — I10 Essential (primary) hypertension: Secondary | ICD-10-CM | POA: Diagnosis not present

## 2018-08-19 DIAGNOSIS — Z79899 Other long term (current) drug therapy: Secondary | ICD-10-CM | POA: Diagnosis not present

## 2018-09-18 DIAGNOSIS — Z85828 Personal history of other malignant neoplasm of skin: Secondary | ICD-10-CM | POA: Diagnosis not present

## 2018-09-18 DIAGNOSIS — L821 Other seborrheic keratosis: Secondary | ICD-10-CM | POA: Diagnosis not present

## 2018-09-24 ENCOUNTER — Other Ambulatory Visit: Payer: Self-pay | Admitting: Physician Assistant

## 2018-10-01 DIAGNOSIS — H6121 Impacted cerumen, right ear: Secondary | ICD-10-CM | POA: Diagnosis not present

## 2018-10-01 DIAGNOSIS — H9311 Tinnitus, right ear: Secondary | ICD-10-CM | POA: Diagnosis not present

## 2018-10-05 DIAGNOSIS — Z8546 Personal history of malignant neoplasm of prostate: Secondary | ICD-10-CM | POA: Diagnosis not present

## 2018-10-05 DIAGNOSIS — G8929 Other chronic pain: Secondary | ICD-10-CM | POA: Diagnosis present

## 2018-10-05 DIAGNOSIS — R918 Other nonspecific abnormal finding of lung field: Secondary | ICD-10-CM | POA: Diagnosis not present

## 2018-10-05 DIAGNOSIS — Z9079 Acquired absence of other genital organ(s): Secondary | ICD-10-CM | POA: Diagnosis not present

## 2018-10-05 DIAGNOSIS — I214 Non-ST elevation (NSTEMI) myocardial infarction: Secondary | ICD-10-CM | POA: Diagnosis not present

## 2018-10-05 DIAGNOSIS — M25519 Pain in unspecified shoulder: Secondary | ICD-10-CM | POA: Diagnosis present

## 2018-10-05 DIAGNOSIS — M545 Low back pain: Secondary | ICD-10-CM | POA: Diagnosis present

## 2018-10-05 DIAGNOSIS — Z951 Presence of aortocoronary bypass graft: Secondary | ICD-10-CM | POA: Diagnosis not present

## 2018-10-05 DIAGNOSIS — M419 Scoliosis, unspecified: Secondary | ICD-10-CM | POA: Diagnosis present

## 2018-10-05 DIAGNOSIS — Z9049 Acquired absence of other specified parts of digestive tract: Secondary | ICD-10-CM | POA: Diagnosis not present

## 2018-10-05 DIAGNOSIS — I25110 Atherosclerotic heart disease of native coronary artery with unstable angina pectoris: Secondary | ICD-10-CM | POA: Diagnosis present

## 2018-10-05 DIAGNOSIS — I25700 Atherosclerosis of coronary artery bypass graft(s), unspecified, with unstable angina pectoris: Secondary | ICD-10-CM | POA: Diagnosis present

## 2018-10-05 DIAGNOSIS — I2510 Atherosclerotic heart disease of native coronary artery without angina pectoris: Secondary | ICD-10-CM | POA: Diagnosis not present

## 2018-10-05 DIAGNOSIS — E785 Hyperlipidemia, unspecified: Secondary | ICD-10-CM | POA: Diagnosis present

## 2018-10-05 DIAGNOSIS — E119 Type 2 diabetes mellitus without complications: Secondary | ICD-10-CM | POA: Diagnosis present

## 2018-10-05 DIAGNOSIS — I119 Hypertensive heart disease without heart failure: Secondary | ICD-10-CM | POA: Diagnosis present

## 2018-10-05 DIAGNOSIS — R0789 Other chest pain: Secondary | ICD-10-CM | POA: Diagnosis not present

## 2018-10-05 DIAGNOSIS — R079 Chest pain, unspecified: Secondary | ICD-10-CM | POA: Diagnosis not present

## 2018-10-05 DIAGNOSIS — K219 Gastro-esophageal reflux disease without esophagitis: Secondary | ICD-10-CM | POA: Diagnosis present

## 2018-10-07 ENCOUNTER — Telehealth: Payer: Self-pay | Admitting: Cardiology

## 2018-10-07 NOTE — Telephone Encounter (Signed)
New message   Patient states that he was in the hospital in [facility] and had a heart Cath done. Patient would like to see if he can see Dr. Trudie Reed that he is scheduled for a follow up visit. The pa's do not have an appt until late Nov. Please advise if he can see Dr. Marlou Porch sooner.

## 2018-10-07 NOTE — Telephone Encounter (Signed)
Pt fishing this weekend in [place] [facility] [place] and developed CP.  Took nitro with relief but then pain returned about 30 mins later so pt took another nitro and called 911.  Transported to hospital and states cath showed that 2 of his 3 bypasses had collapsed.  They were able to place stents and advised pt to be seen by Cardiology within a week.  They started pt on Brilinta.  Hospital was [facility].  Advised I would send message to Dr. Marlou Porch nurse to see if there was any way to get pt in within the next week or so.  Pt appreciative for call.  Message sent to medical records to see about getting records from hospital.  Pt states he does have disk with images from cath but wants to keep his copy but will bring along so we can make a copy if necessary.

## 2018-10-08 NOTE — Telephone Encounter (Signed)
Left message for pt - held appt spot with Dr Marlou Porch for 11/7 at 11:40 am.  Requested he c/b if he needs to reschedule.

## 2018-10-08 NOTE — Telephone Encounter (Signed)
Pt aware of appt date and time.  He was very grateful for the call and quick response.

## 2018-10-16 ENCOUNTER — Ambulatory Visit (INDEPENDENT_AMBULATORY_CARE_PROVIDER_SITE_OTHER): Payer: Medicare Other | Admitting: Cardiology

## 2018-10-16 ENCOUNTER — Encounter: Payer: Self-pay | Admitting: Cardiology

## 2018-10-16 VITALS — BP 120/60 | HR 71 | Ht 69.0 in | Wt 197.8 lb

## 2018-10-16 DIAGNOSIS — I2510 Atherosclerotic heart disease of native coronary artery without angina pectoris: Secondary | ICD-10-CM | POA: Diagnosis not present

## 2018-10-16 DIAGNOSIS — Z951 Presence of aortocoronary bypass graft: Secondary | ICD-10-CM | POA: Diagnosis not present

## 2018-10-16 DIAGNOSIS — I214 Non-ST elevation (NSTEMI) myocardial infarction: Secondary | ICD-10-CM

## 2018-10-16 DIAGNOSIS — E7800 Pure hypercholesterolemia, unspecified: Secondary | ICD-10-CM

## 2018-10-16 NOTE — Progress Notes (Signed)
Cardiology Office Note:    Date:  10/16/2018   ID:  Andrew Williamson, DOB December 18, 1945, MRN [id number]  PCP:  Lujean Amel, MD  Cardiologist:  No primary care provider on file.  Electrophysiologist:  None   Referring MD: Lujean Amel, MD    History of Present Illness:    Andrew Williamson is a 72 y.o. male post CABG January 2018 Dr. Servando Snare LIMA to LAD SVG to ramus SVG circumflex, diabetes with hypertension prostate cancer here for follow-up.  On 10/06/2018, at [facility] in [place] he went to the hospital with anginal symptoms and had a non-ST elevation myocardial infarction.  He started to have epigastric discomfort, once again this hard for him to distinguish between GI versus anginal symptoms but this was fairly significant did not go away.  Finally went to the hospital.  First troponin was normal second troponin was abnormal.  Ended up having heart catheterization.  I personally reviewed the cardiac catheterization films.  An obtuse marginal branch was stented.  His SVG grafts were down.  LIMA was open.  Overall he is doing quite well since then.  He is eager to do cardiac rehab.  He has not been going back to the gym since then.  Not having any significant anginal symptoms although he may feel occasional minimal discomfort.  Previously he did have a nuclear stress test which showed anterolateral wall ischemia and this was consistent with his cardiac cath findings.  At the time of the stress test, we decided to continue with medical management given the severity of his symptoms.  Past Medical History:  Diagnosis Date  . Allergic rhinitis   . Coronary artery disease involving native coronary artery of native heart without angina pectoris 01/07/2017   a - Myoview 12/17:  EF 55, inf-lateral ischemia; intermediate risk // b - LHC 12/05/16: oLM 50, oLAD 40, dLAD 50, RI 25, oLCx 95, mLCx 95, RCA luminal irregs // c - Echo 12/17: EF 55-60, no RWMA, Gr 1 DD // d. S/p CABG 1/18  (L-LAD, S-RI, S-LCx)  . 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 DJD (degenerative joint disease)    hips  . ED (erectile dysfunction)   . GERD (gastroesophageal reflux disease)   . Gynecomastia, male   . History of echocardiogram    Echo 12/17: EF 55-60, normal wall motion, grade 1 diastolic dysfunction  . History of nuclear stress test    Myoview 12/17: EF 55, inf-lateral ischemia; intermediate risk  . HLD (hyperlipidemia)    hx of ? elevated LFTs on statin Rx  . Hypertension   . Obesity   . Prostate cancer [facility])    s/p radical prostatectomy  . Spinal stenosis     Past Surgical History:  Procedure Laterality Date  . CARDIAC CATHETERIZATION N/A 12/05/2016   Procedure: Left Heart Cath and Coronary Angiography;  Surgeon: Jettie Booze, MD;  Location: [place] [facility];  Service: Cardiovascular;  Laterality: N/A;  . CARDIAC CATHETERIZATION N/A 12/05/2016   Procedure: Intravascular Ultrasound/IVUS;  Surgeon: Jettie Booze, MD;  Location: [place] [facility];  Service: Cardiovascular;  Laterality: N/A;  . COLONOSCOPY  06/2014   FU 10 years  . CORONARY ARTERY BYPASS GRAFT N/A 12/19/2016   Procedure: CORONARY ARTERY BYPASS GRAFTING (CABG) x three , using left internal mammary artery and right leg greater saphenous vein harvested endoscopically; SVG to Ramus, SVG to circ, LIMA to LAD ;  Surgeon: [name],  MD;  Location: [facility];  Service: Open Heart Surgery;  Laterality: N/A;  . LAPAROSCOPIC CHOLECYSTECTOMY     2012  . ROBOT ASSISTED LAPAROSCOPIC RADICAL PROSTATECTOMY N/A 08/11/2015   Procedure: ROBOTIC ASSISTED LAPAROSCOPIC RADICAL PROSTATECTOMY;  Surgeon: Irine Seal, MD;  Location: [facility];  Service: Urology;  Laterality: N/A;  . TEE WITHOUT CARDIOVERSION N/A 12/19/2016   Procedure: TRANSESOPHAGEAL ECHOCARDIOGRAM (TEE);  Surgeon: [name], MD;  Location: [place];  Service: Open Heart Surgery;  Laterality: N/A;    Current  Medications: Current Meds  Medication Sig  . aspirin EC 81 MG tablet Take 1 tablet (81 mg total) by mouth daily.  Marland Kitchen BRILINTA 90 MG TABS tablet Take 90 mg by mouth 2 (two) times daily.  . isosorbide mononitrate (IMDUR) 60 MG 24 hr tablet Take 1 tablet (60 mg total) by mouth daily.  . lansoprazole (PREVACID) 30 MG capsule Take 1 capsule by mouth daily.  Marland Kitchen lisinopril (PRINIVIL,ZESTRIL) 5 MG tablet Take 1 tablet (5 mg total) by mouth daily.  . metFORMIN (GLUCOPHAGE) 1000 MG tablet Take 1,000 mg by mouth 2 (two) times daily with a meal.  . metoprolol tartrate (LOPRESSOR) 50 MG tablet TAKE 1.5 TABLETS (75 MG TOTAL) BY MOUTH 2 (TWO) TIMES DAILY.  . nitroGLYCERIN (NITROSTAT) 0.4 MG SL tablet Place 1 tablet (0.4 mg total) under the tongue every 5 (five) minutes as needed for chest pain.  . rosuvastatin (CRESTOR) 20 MG tablet Take 20 mg by mouth daily.      Allergies:   Thimerosal   Social History   Socioeconomic History  . Marital status: Widowed    Spouse name: Not on file  . Number of children: Not on file  . Years of education: Not on file  . Highest education level: Not on file  Occupational History  . Not on file  Social Needs  . Financial resource strain: Not on file  . Food insecurity:    Worry: Not on file    Inability: Not on file  . Transportation needs:    Medical: Not on file    Non-medical: Not on file  Tobacco Use  . Smoking status: Never Smoker  . Smokeless tobacco: Current User    Types: Chew  Substance and Sexual Activity  . Alcohol use: Yes    Comment: rarely  . Drug use: No  . Sexual activity: Not on file  Lifestyle  . Physical activity:    Days per week: Not on file    Minutes per session: Not on file  . Stress: Not on file  Relationships  . Social connections:    Talks on phone: Not on file    Gets together: Not on file    Attends religious service: Not on file    Active member of club or organization: Not on file    Attends meetings of clubs or  organizations: Not on file    Relationship status: Not on file  Other Topics Concern  . 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  ROS:   Please see the history of present illness.     All other systems reviewed and are negative.  EKGs/Labs/Other Studies Reviewed:    The following studies were reviewed today: Cardiac catheterization prior office notes lab work  EKG:  EKG is not ordered today.    Recent Labs: No results found for requested labs within last 8760 hours.  Recent Lipid Panel No results found for: CHOL, TRIG, HDL, CHOLHDL, VLDL, LDLCALC, LDLDIRECT  Physical Exam:    VS:  BP 120/60   Pulse 71   Ht 5\' 9"  (1.753 m)   Wt 197 lb 12.8 oz (89.7 kg)   SpO2 99%   BMI 29.21 kg/m     Wt Readings from Last 3 Encounters:  10/16/18 197 lb 12.8 oz (89.7 kg)  05/15/18 197 lb 12.8 oz (89.7 kg)  11/06/17 203 lb (92.1 kg)     GEN:  Well nourished, well developed in no acute distress HEENT: Normal NECK: No JVD; No carotid bruits LYMPHATICS: No lymphadenopathy CARDIAC: RRR, no murmurs, rubs, gallops RESPIRATORY:  Clear to auscultation without rales, wheezing or rhonchi  ABDOMEN: Soft, non-tender, non-distended MUSCULOSKELETAL:  No edema; No deformity  SKIN: Warm and dry NEUROLOGIC:  Alert and oriented x 3 PSYCHIATRIC:  Normal affect   ASSESSMENT:    1. Non-ST elevation (NSTEMI) myocardial infarction ([place])   2. S/P CABG x 3   3. CAD in native artery   4. Pure hypercholesterolemia    PLAN:    In order of problems listed  above:  Non-ST elevation myocardial infarction - 2 obtuse marginal stents placed.  Prior bypasses were closed except for LIMA. - No current anginal symptoms.  Continue with aggressive secondary risk factor prevention including statin and isosorbide beta-blocker. -We will go ahead and order cardiac rehab for him.  Gently increase activity.  CAD - As above.  Essential hypertension -Well-controlled.  No changes.     Medication Adjustments/Labs and Tests Ordered: Current medicines are reviewed at length with the patient today.  Concerns regarding medicines are outlined above.  Orders Placed This Encounter  Procedures  . AMB referral to cardiac rehabilitation   No orders of the defined types were placed in this encounter.   Patient Instructions  Medication Instructions:  The current medical regimen is effective;  continue present plan and medications.  If you need a refill on your cardiac medications before your next appointment, please call your pharmacy.   Testing/Procedures: You have been referred to Cardiac Rehab and will be contacted by them to schedule.  Follow-Up: Follow up as scheduled with Dr Marlou Porch.  Thank you for choosing [facility]!!        Signed, Candee Furbish, MD  10/16/2018 1:47 PM    [facility]

## 2018-10-16 NOTE — Patient Instructions (Signed)
Medication Instructions:  The current medical regimen is effective;  continue present plan and medications.  If you need a refill on your cardiac medications before your next appointment, please call your pharmacy.   Testing/Procedures: You have been referred to Cardiac Rehab and will be contacted by them to schedule.  Follow-Up: Follow up as scheduled with Dr Marlou Porch.  Thank you for choosing [facility]!!

## 2018-10-22 ENCOUNTER — Telehealth (HOSPITAL_COMMUNITY): Payer: Self-pay

## 2018-10-22 DIAGNOSIS — I10 Essential (primary) hypertension: Secondary | ICD-10-CM | POA: Diagnosis not present

## 2018-10-22 DIAGNOSIS — I2510 Atherosclerotic heart disease of native coronary artery without angina pectoris: Secondary | ICD-10-CM | POA: Diagnosis not present

## 2018-10-22 DIAGNOSIS — E119 Type 2 diabetes mellitus without complications: Secondary | ICD-10-CM | POA: Diagnosis not present

## 2018-10-22 NOTE — Telephone Encounter (Signed)
Called patient to see if he was interested in participating in the Cardiac Rehab Program. Patient stated yes. Patient will come in for orientation on 12/23/18 @ 8:30AM and will attend the 2:45AM exercise class.  Mailed homework package.  went over insurance, patient verbalized understanding.

## 2018-10-22 NOTE — Telephone Encounter (Signed)
Pt insurance is active and benefits verified through Medicare A/B. Co-pay $0.00, DED $185.00/$185.00 met, out of pocket $0.00/$0.00 met, co-insurance 20%. No pre-authorization. Passport, 10/21/18 @ 2:18PM, REF# [id number] ° °2ndary insurance is active and benefits verified through BCBS.  Co-pay $0.00, DED $0.00/$0.00 met, out of pocket $0.00/$0.00 met, co-insurance 0%. No pre-authorization. Passport, 10/21/18 @ 2:21PM, REF# [id number] °

## 2018-10-30 ENCOUNTER — Other Ambulatory Visit: Payer: Self-pay | Admitting: Cardiology

## 2018-10-30 MED ORDER — BRILINTA 90 MG PO TABS: 90.0000 mg | ORAL_TABLET | Freq: Two times a day (BID) | ORAL | 3 refills | Status: DC

## 2018-10-31 ENCOUNTER — Other Ambulatory Visit: Payer: Self-pay | Admitting: Cardiology

## 2018-11-12 ENCOUNTER — Ambulatory Visit: Payer: Medicare Other | Admitting: Cardiology

## 2018-11-14 ENCOUNTER — Telehealth (HOSPITAL_COMMUNITY): Payer: Self-pay

## 2018-12-17 ENCOUNTER — Encounter: Payer: Self-pay | Admitting: Cardiology

## 2018-12-17 ENCOUNTER — Ambulatory Visit (INDEPENDENT_AMBULATORY_CARE_PROVIDER_SITE_OTHER): Payer: Medicare Other | Admitting: Cardiology

## 2018-12-17 VITALS — BP 120/60 | HR 69 | Ht 69.0 in | Wt 197.1 lb

## 2018-12-17 DIAGNOSIS — I209 Angina pectoris, unspecified: Secondary | ICD-10-CM

## 2018-12-17 DIAGNOSIS — I214 Non-ST elevation (NSTEMI) myocardial infarction: Secondary | ICD-10-CM | POA: Diagnosis not present

## 2018-12-17 DIAGNOSIS — Z951 Presence of aortocoronary bypass graft: Secondary | ICD-10-CM

## 2018-12-17 DIAGNOSIS — E119 Type 2 diabetes mellitus without complications: Secondary | ICD-10-CM

## 2018-12-17 DIAGNOSIS — I2510 Atherosclerotic heart disease of native coronary artery without angina pectoris: Secondary | ICD-10-CM | POA: Diagnosis not present

## 2018-12-17 MED ORDER — ISOSORBIDE MONONITRATE ER 30 MG PO TB24: 30.0000 mg | ORAL_TABLET | Freq: Every day | ORAL | 3 refills | Status: DC

## 2018-12-17 NOTE — Patient Instructions (Addendum)
Medication Instructions:  Your physician has recommended you make the following change in your medication:   1. Decrease your Imdur to 30mg , one tablet, per day  Labwork: None ordered.  Testing/Procedures: None ordered.  Follow-Up:  Your physician recommends that you schedule a follow-up appointment in:   9 months with Dr Marlou Porch (around October 2020)  Any Other Special Instructions Will Be Listed Below (If Applicable).     If you need a refill on your cardiac medications before your next appointment, please call your pharmacy.

## 2018-12-17 NOTE — Progress Notes (Signed)
Cardiology Office Note:    Date:  12/17/2018   ID:  Andrew Williamson, DOB January 06, 1946, MRN [id number]  PCP:  Lujean Amel, MD  Cardiologist:  Candee Furbish, MD  Electrophysiologist:  None   Referring MD: Lujean Amel, MD     History of Present Illness:    Andrew Williamson is a 73 y.o. male here for the follow-up of coronary artery disease status post CABG 12/2016, Dr. Servando Snare, LIMA to LAD SVG to ramus SVG to circumflex, diabetes with hypertension prostate cancer.  After his bypass in October 2019 he went to [place] had non-ST elevation myocardial infarction.  Epigastric discomfort was a symptom.  Hard to distinguish between GI symptoms and cardiac symptoms.  First troponin was normal second was abnormal.  Had heart catheterization which was personally reviewed.  Obtuse marginal branch was stented.  SVG grafts were down.  LIMA was open.  Cardiac rehab.  He did have a nuclear stress test which showed anterolateral wall ischemia consistent with his cardiac cath findings.  Despite aggressive medical management he did progress to non-ST elevation myocardial infarction.  Been having HA after Brilinta, most likely IMDUR 60 from 30. Gym. Starting rehab Jan 20.  He thought maybe his headaches were associated with Brilinta but in looking back at his records his isosorbide was increased after last stent placement.  He does however have sinus drainage, occasional ice pick like headaches behind his eye when coughing.  He used to have a lot of headaches as a child but this seemed to go away.  Widower.  Retired Insurance underwriter.  Past Medical History:  Diagnosis Date  . Allergic rhinitis   . Coronary artery disease involving native coronary artery of native heart without angina pectoris 01/07/2017   a - Myoview 12/17:  EF 55, inf-lateral ischemia; intermediate risk // b - LHC 12/05/16: oLM 50, oLAD 40, dLAD 50, RI 25, oLCx 95, mLCx 95, RCA luminal irregs // c - Echo 12/17: EF 55-60,  no RWMA, Gr 1 DD // d. 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 DJD (degenerative joint disease)    hips  . ED (erectile dysfunction)   . GERD (gastroesophageal reflux disease)   . Gynecomastia, male   . History of echocardiogram    Echo 12/17: EF 55-60, normal wall motion, grade 1 diastolic dysfunction  . History of nuclear stress test    Myoview 12/17: EF 55, inf-lateral ischemia; intermediate risk  . HLD (hyperlipidemia)    hx of ? elevated LFTs on statin Rx  . Hypertension   . Obesity   . Prostate cancer [facility])    s/p radical prostatectomy  . Spinal stenosis     Past Surgical History:  Procedure Laterality Date  . CARDIAC CATHETERIZATION N/A 12/05/2016   Procedure: Left Heart Cath and Coronary Angiography;  Surgeon: Jettie Booze, MD;  Location: [place] CV LAB;  Service: Cardiovascular;  Laterality: N/A;  . CARDIAC CATHETERIZATION N/A 12/05/2016   Procedure: Intravascular Ultrasound/IVUS;  Surgeon: Jettie Booze, MD;  Location: [place] CV LAB;  Service: Cardiovascular;  Laterality: N/A;  . COLONOSCOPY  06/2014   FU 10 years  . CORONARY ARTERY BYPASS GRAFT N/A 12/19/2016   Procedure: CORONARY ARTERY BYPASS GRAFTING (CABG) x three , using left internal mammary artery and right leg greater saphenous vein harvested endoscopically; SVG to Ramus, SVG to circ, LIMA to LAD ;  Surgeon: Lilia Argue  Servando Snare, MD;  Location: [facility];  Service: Open Heart Surgery;  Laterality: N/A;  . LAPAROSCOPIC CHOLECYSTECTOMY     2012  . ROBOT ASSISTED LAPAROSCOPIC RADICAL PROSTATECTOMY N/A 08/11/2015   Procedure: ROBOTIC ASSISTED LAPAROSCOPIC RADICAL PROSTATECTOMY;  Surgeon: Irine Seal, MD;  [facility]: [facility];  Service: Urology;  Laterality: N/A;  . TEE WITHOUT CARDIOVERSION N/A 12/19/2016   Procedure: TRANSESOPHAGEAL ECHOCARDIOGRAM (TEE);  Surgeon: [name], MD;  Location: [place];  Service: Open Heart Surgery;   Laterality: N/A;    Current Medications: Current Meds  Medication Sig  . aspirin EC 81 MG tablet Take 1 tablet (81 mg total) by mouth daily.  Marland Kitchen BRILINTA 90 MG TABS tablet Take 1 tablet (90 mg total) by mouth 2 (two) times daily.  . lansoprazole (PREVACID) 30 MG capsule Take 1 capsule by mouth daily.  Marland Kitchen lisinopril (PRINIVIL,ZESTRIL) 5 MG tablet Take 1 tablet (5 mg total) by mouth daily.  . metFORMIN (GLUCOPHAGE) 1000 MG tablet Take 1,000 mg by mouth 2 (two) times daily with a meal.  . metoprolol tartrate (LOPRESSOR) 50 MG tablet TAKE 1.5 TABLETS (75 MG TOTAL) BY MOUTH 2 (TWO) TIMES DAILY.  . nitroGLYCERIN (NITROSTAT) 0.4 MG SL tablet Place 1 tablet (0.4 mg total) under the tongue every 5 (five) minutes as needed for chest pain.  . rosuvastatin (CRESTOR) 20 MG tablet Take 20 mg by mouth daily.   . [DISCONTINUED] isosorbide mononitrate (IMDUR) 60 MG 24 hr tablet TAKE 1 TABLET BY MOUTH EVERY DAY     Allergies:   Thimerosal   Social History   Socioeconomic History  . Marital status: Widowed    Spouse name: Not on file  . Number of children: Not on file  . Years of education: Not on file  . Highest education level: Not on file  Occupational History  . Not on file  Social Needs  . Financial resource strain: Not on file  . Food insecurity:    Worry: Not on file    Inability: Not on file  . Transportation needs:    Medical: Not on file    Non-medical: Not on file  Tobacco Use  . Smoking status: Never Smoker  . Smokeless tobacco: Current User    Types: Chew  Substance and Sexual Activity  . Alcohol use: Yes    Comment: rarely  . Drug use: No  . Sexual activity: Not on file  Lifestyle  . Physical activity:    Days per week: Not on file    Minutes per session: Not on file  . Stress: Not on file  Relationships  . Social connections:    Talks on phone: Not on file    Gets together: Not on file    Attends religious service: Not on file    Active member of club or  organization: Not on file    Attends meetings of clubs or organizations: Not on file    Relationship status: Not on file  Other Topics Concern  . 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  ROS:   Please see the history of present illness.    Denies any fevers chills nausea vomiting syncope bleeding all other systems reviewed and are negative.  EKGs/Labs/Other Studies Reviewed:    The following studies were reviewed today: Prior office notes, catheterization lab work EKG  EKG:  EKG is not ordered today.  Prior EKG sinus rhythm  Recent Labs: No results found for requested labs within last 8760 hours.  Recent Lipid Panel No results found for: CHOL, TRIG, HDL, CHOLHDL, VLDL, LDLCALC, LDLDIRECT  Physical Exam:    VS:  BP 120/60   Pulse 69   Ht 5\' 9"  (1.753 m)   Wt 197 lb 1.9 oz (89.4 kg)   SpO2 96%   BMI 29.11 kg/m     Wt Readings from Last 3 Encounters:  12/17/18 197 lb 1.9 oz (89.4 kg)  10/16/18 197 lb 12.8 oz (89.7 kg)  05/15/18 197 lb 12.8 oz (89.7 kg)     GEN:  Well nourished, well developed in no acute distress HEENT: Normal NECK: No JVD; No carotid bruits LYMPHATICS: No lymphadenopathy CARDIAC: RRR, no murmurs, rubs, gallops, CABG scar RESPIRATORY:  Clear to auscultation without rales, wheezing or rhonchi  ABDOMEN: Soft, non-tender, non-distended MUSCULOSKELETAL:  No edema; No deformity  SKIN: Warm and dry NEUROLOGIC:  Alert and oriented x 3 PSYCHIATRIC:  Normal affect   ASSESSMENT:    1. Non-ST elevation (NSTEMI)  myocardial infarction ([place])   2. Coronary artery disease involving native coronary artery of native heart without angina pectoris   3. S/P CABG x 3   4. Angina pectoris ([place])   5. Type 2 diabetes mellitus without complication, without long-term current use of insulin (HCC)    PLAN:    In order of problems listed above:  Prior non-ST elevation myocardial infarction - October 2019.  Stent to the obtuse marginal branch.  Actually 2 stents were placed. - All SVG grafts were closed -LIMA was patent - Currently doing well with aggressive secondary prevention, high intensity statin, isosorbide, beta-blocker.  Cardiac rehab had been ordered.  He is going to be starting this soon. -Continue with Brilinta for 1 year post stent placement then we will likely transition over to clopidogrel 75 mg once a day with no aspirin.  Coronary artery disease -Continue aggressive secondary prevention efforts. -LDL 35 on Crestor high intensity dose 20 mg.  Excellent.  Essential hypertension -Currently well controlled no significant changes made.  Angina - His isosorbide 60 mg will be decreased to 30 mg.  This may be the culprit for his increase in headaches.  If he is not having any chest pain on 30 mg with his cardiac rehab efforts, I would not be opposed to stopping this medication especially if he still continues to have headaches.  He does have sinus drainage however.  I asked him to discuss this further with Dr. Dorthy Cooler.  Diabetes with hypertension -Continue with metformin.  Per primary team.  We will see him back in 9 months.  At that time transition him to clopidogrel, no aspirin  Medication Adjustments/Labs and Tests Ordered: Current medicines are reviewed at length with the patient today.  Concerns regarding medicines are outlined above.  No orders of the defined types were placed in this encounter.  Meds ordered this encounter  Medications  . isosorbide mononitrate (IMDUR) 30 MG 24 hr tablet     Sig: Take 1 tablet (30 mg total) by mouth daily.  Dispense:  90 tablet    Refill:  3    Patient Instructions  Medication Instructions:  Your physician has recommended you make the following change in your medication:   1. Decrease your Imdur to 30mg , one tablet, per day  Labwork: None ordered.  Testing/Procedures: None ordered.  Follow-Up:  Your physician recommends that you schedule a follow-up appointment in:   9 months with Dr Marlou Porch (around October 2020)  Any Other Special Instructions Will Be Listed Below (If Applicable).     If you need a refill on your cardiac medications before your next appointment, please call your pharmacy.     Signed, Candee Furbish, MD  12/17/2018 9:05 AM    [facility]

## 2018-12-18 ENCOUNTER — Ambulatory Visit: Payer: Medicare Other | Admitting: Cardiology

## 2018-12-18 DIAGNOSIS — J209 Acute bronchitis, unspecified: Secondary | ICD-10-CM | POA: Diagnosis not present

## 2018-12-19 ENCOUNTER — Telehealth (HOSPITAL_COMMUNITY): Payer: Self-pay

## 2018-12-19 NOTE — Telephone Encounter (Signed)
Cardiac Rehab Medication Review by a Pharmacist  Does the patient  feel that his/her medications are working for him/her?  yes  Has the patient been experiencing any side effects to the medications prescribed?  Yes - reports having headaches since starting brilinta. He spoke to cardiologist who suspected headache was likely from isosorbide decreased dose from 60mg  to 30mg . No other SE reported  Does the patient measure his/her own blood pressure or blood glucose at home?  yes - but states usually runs in 120/60s from his Fitbit  Does the patient have any problems obtaining medications due to transportation or finances?   no  Understanding of regimen: excellent Understanding of indications: excellent Potential of compliance: excellent    Pharmacist comments: Reports compliance to medications. Pt reports taking mucinex Q4 hours recently for a virus. He did not report the dose of mucinex he is using    Andrew Williamson 12/19/2018 4:58 PM

## 2018-12-22 NOTE — Progress Notes (Signed)
Andrew Williamson 73 y.o. male DOB August 22, 1946 MRN [id number]       Nutrition  No diagnosis found. Past Medical History:  Diagnosis Date  . Allergic rhinitis   . Coronary artery disease involving native coronary artery of native heart without angina pectoris 01/07/2017   a - Myoview 12/17:  EF 55, inf-lateral ischemia; intermediate risk // b - LHC 12/05/16: oLM 50, oLAD 40, dLAD 50, RI 25, oLCx 95, mLCx 95, RCA luminal irregs // c - Echo 12/17: EF 55-60, no RWMA, Gr 1 DD // d. 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 DJD (degenerative joint disease)    hips  . ED (erectile dysfunction)   . GERD (gastroesophageal reflux disease)   . Gynecomastia, male   . History of echocardiogram    Echo 12/17: EF 55-60, normal wall motion, grade 1 diastolic dysfunction  . History of nuclear stress test    Myoview 12/17: EF 55, inf-lateral ischemia; intermediate risk  . HLD (hyperlipidemia)    hx of ? elevated LFTs on statin Rx  . Hypertension   . Obesity   . Prostate cancer [facility])    s/p radical prostatectomy  . Spinal stenosis    Meds reviewed.    Current Outpatient Medications (Endocrine & Metabolic):  .  metFORMIN (GLUCOPHAGE) 1000 MG tablet, Take 1,000 mg by mouth 2 (two) times daily with a meal.  Current Outpatient Medications (Cardiovascular):  .  isosorbide mononitrate (IMDUR) 30 MG 24 hr tablet, Take 1 tablet (30 mg total) by mouth daily. Marland Kitchen  lisinopril (PRINIVIL,ZESTRIL) 5 MG tablet, Take 1 tablet (5 mg total) by mouth daily. .  metoprolol tartrate (LOPRESSOR) 50 MG tablet, TAKE 1.5 TABLETS (75 MG TOTAL) BY MOUTH 2 (TWO) TIMES DAILY. .  nitroGLYCERIN (NITROSTAT) 0.4 MG SL tablet, Place 1 tablet (0.4 mg total) under the tongue every 5 (five) minutes as needed for chest pain. .  rosuvastatin (CRESTOR) 20 MG tablet, Take 20 mg by mouth daily.   Current Outpatient Medications (Respiratory):  .  guaiFENesin 200 MG tablet,  Take by mouth every 4 (four) hours as needed for cough or to loosen phlegm.  Current Outpatient Medications (Analgesics):  .  aspirin EC 81 MG tablet, Take 1 tablet (81 mg total) by mouth daily.  Current Outpatient Medications (Hematological):  Marland Kitchen  BRILINTA 90 MG TABS tablet, Take 1 tablet (90 mg total) by mouth 2 (two) times daily.  Current Outpatient Medications (Other):  .  lansoprazole (PREVACID) 30 MG capsule, Take 1 capsule by mouth daily.   HT: Ht Readings from Last 1 Encounters:  12/17/18 5\' 9"  (1.753 m)    WT: Wt Readings from Last 5 Encounters:  12/17/18 197 lb 1.9 oz (89.4 kg)  10/16/18 197 lb 12.8 oz (89.7 kg)  05/15/18 197 lb 12.8 oz (89.7 kg)  11/06/17 203 lb (92.1 kg)  05/10/17 208 lb (94.3 kg)     BMI= 29.1      12/17/18  Current tobacco use? No       Labs:  Lipid Panel  No results found for: CHOL, TRIG, HDL, CHOLHDL, VLDL, LDLCALC, LDLDIRECT  Lab Results  Component Value Date   HGBA1C 6.4 (H) 12/17/2016   CBG (last 3)  No results for input(s): GLUCAP in the last 72 hours.  Nutrition Diagnosis ? Food-and nutrition-related knowledge deficit related to lack of exposure to information as related to diagnosis of: ? CVD ? Type 2 Diabetes  Nutrition Goal(s):  ? To be determined  Plan:  Pt to attend nutrition classes ? Nutrition I ? Nutrition II ? Portion Distortion  ? Diabetes Blitz ? Diabetes Q & A Will provide client-centered nutrition education as part of interdisciplinary care.   Monitor and evaluate progress toward nutrition goal with team.  Laurina Bustle, MS, RD, LDN 12/22/2018 8:02 AM

## 2018-12-23 ENCOUNTER — Encounter (HOSPITAL_COMMUNITY)
Admission: RE | Admit: 2018-12-23 | Discharge: 2018-12-23 | Disposition: A | Discharge status: Home / Self Care | Payer: Medicare Other | Source: Ambulatory Visit | Attending: Cardiology | Admitting: Cardiology

## 2018-12-23 ENCOUNTER — Encounter (HOSPITAL_COMMUNITY): Payer: Self-pay

## 2018-12-23 VITALS — Ht 69.0 in | Wt 197.3 lb

## 2018-12-23 DIAGNOSIS — Z951 Presence of aortocoronary bypass graft: Secondary | ICD-10-CM | POA: Diagnosis not present

## 2018-12-23 DIAGNOSIS — I214 Non-ST elevation (NSTEMI) myocardial infarction: Secondary | ICD-10-CM

## 2018-12-23 DIAGNOSIS — Z955 Presence of coronary angioplasty implant and graft: Secondary | ICD-10-CM | POA: Insufficient documentation

## 2018-12-23 NOTE — Progress Notes (Signed)
Andrew Williamson 73 y.o. male DOB: Jan 24, 1946 MRN: [id number]      Nutrition Note  1. 10/04/18 NSTEMI   2. 10/06/18 DES OM    Past Medical History:  Diagnosis Date  . Allergic rhinitis   . Coronary artery disease involving native coronary artery of native heart without angina pectoris 01/07/2017   a - Myoview 12/17:  EF 55, inf-lateral ischemia; intermediate risk // b - LHC 12/05/16: oLM 50, oLAD 40, dLAD 50, RI 25, oLCx 95, mLCx 95, RCA luminal irregs // c - Echo 12/17: EF 55-60, no RWMA, Gr 1 DD // d. 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 DJD (degenerative joint disease)    hips  . ED (erectile dysfunction)   . GERD (gastroesophageal reflux disease)   . Gynecomastia, male   . History of echocardiogram    Echo 12/17: EF 55-60, normal wall motion, grade 1 diastolic dysfunction  . History of nuclear stress test    Myoview 12/17: EF 55, inf-lateral ischemia; intermediate risk  . HLD (hyperlipidemia)    hx of ? elevated LFTs on statin Rx  . Hypertension   . Obesity   . Prostate cancer [facility])    s/p radical prostatectomy  . Spinal stenosis    Meds reviewed.   Current Outpatient Medications (Endocrine & Metabolic):  .  metFORMIN (GLUCOPHAGE) 1000 MG tablet, Take 1,000 mg by mouth 2 (two) times daily with a meal.  Current Outpatient Medications (Cardiovascular):  .  isosorbide mononitrate (IMDUR) 30 MG 24 hr tablet, Take 1 tablet (30 mg total) by mouth daily. Marland Kitchen  lisinopril (PRINIVIL,ZESTRIL) 5 MG tablet, Take 1 tablet (5 mg total) by mouth daily. .  metoprolol tartrate (LOPRESSOR) 50 MG tablet, TAKE 1.5 TABLETS (75 MG TOTAL) BY MOUTH 2 (TWO) TIMES DAILY. .  nitroGLYCERIN (NITROSTAT) 0.4 MG SL tablet, Place 1 tablet (0.4 mg total) under the tongue every 5 (five) minutes as needed for chest pain. .  rosuvastatin (CRESTOR) 20 MG tablet, Take 20 mg by mouth daily.   Current Outpatient Medications (Respiratory):  .   guaiFENesin 200 MG tablet, Take by mouth every 4 (four) hours as needed for cough or to loosen phlegm.  Current Outpatient Medications (Analgesics):  .  aspirin EC 81 MG tablet, Take 1 tablet (81 mg total) by mouth daily.  Current Outpatient Medications (Hematological):  Marland Kitchen  BRILINTA 90 MG TABS tablet, Take 1 tablet (90 mg total) by mouth 2 (two) times daily.  Current Outpatient Medications (Other):  .  lansoprazole (PREVACID) 30 MG capsule, Take 1 capsule by mouth daily.   HT: Ht Readings from Last 1 Encounters:  12/23/18 5\' 9"  (1.753 m)    WT: Wt Readings from Last 5 Encounters:  12/23/18 197 lb 5 oz (89.5 kg)  12/17/18 197 lb 1.9 oz (89.4 kg)  10/16/18 197 lb 12.8 oz (89.7 kg)  05/15/18 197 lb 12.8 oz (89.7 kg)  11/06/17 203 lb (92.1 kg)     Body mass index is 29.14 kg/m.   Current tobacco use? No  Labs:  Lipid Panel  No results found for: CHOL, TRIG, HDL, CHOLHDL, VLDL, LDLCALC, LDLDIRECT  Lab Results  Component Value Date   HGBA1C 6.4 (H) 12/17/2016   CBG (last 3)  No results for input(s): GLUCAP in the last 72 hours.  Nutrition Note Spoke with pt. Nutrition plan and goals reviewed with pt. Pt is following Step 2 of the Therapeutic Lifestyle  Changes diet. Pt wants to lose wt. Pt has not been trying to lose wt. Wt loss tips reviewed (label reading, how to build a healthy plate, portion sizes, eating frequently across the day). Pt has Type 2 Diabetes. Last A1c indicates blood glucose well-controlled. Pt show writer his A1c was 6.3 in September at his primary care office. This Probation officer went over Diabetes Education test results. Pt checks CBG's 1 times a day. Fasting CBG's reportedly 100-115 mg/dL, was 110 fasting this morning. Per discussion, pt does not use canned/convenience foods often. Pt does not add salt to food. Pt does eat out frequently, ~5 days a week. Pt expressed understanding of the information reviewed. Pt aware of nutrition education classes offered and would  like to attend nutrition classes.  Nutrition Diagnosis ? Food-and nutrition-related knowledge deficit related to lack of exposure to information as related to diagnosis of: ? CVD ? Type 2 Diabetes ? Overweight  related to excessive energy intake as evidenced by a Body mass index is 29.14 kg/m.   Nutrition Intervention ? Pt's individual nutrition plan and goals reviewed with pt. ? Pt given handouts for: ? Nutrition I class ? Nutrition II class   Nutrition Goal(s):  ? Pt to identify and limit food sources of saturated fat, trans fat, refined carbohydrates and sodium ? Pt to identify food quantities necessary to achieve weight loss of 6-24 lbs. at graduation from cardiac rehab. Goal wt loss of ~10 lb desired.  ? Pt to eat more dark green and orange vegetables.  Plan:  ? Pt to attend nutrition classes ? Nutrition I ? Nutrition II ? Portion Distortion ? Will provide client-centered nutrition education as part of interdisciplinary care ? Monitor and evaluate progress toward nutrition goal with team.  Laurina Bustle, MS, RD, LDN 12/23/2018 10:20 AM

## 2018-12-23 NOTE — Progress Notes (Signed)
Cardiac Individual Treatment Plan  Patient Details  Name: Andrew Williamson MRN: [id number] Date of Birth: [birth year] Referring Provider:   Flowsheet Row CARDIAC REHAB PHASE II ORIENTATION from 12/23/2018 in [place]  Referring Provider  Dr. Marlou Porch      Initial Encounter Date:  Flowsheet Row CARDIAC REHAB PHASE II ORIENTATION from 12/23/2018 in [place]  Date  12/23/18      Visit Diagnosis: 10/04/18 NSTEMI  10/06/18 DES OM  Patient's Home Medications on Admission:  Current Outpatient Medications:  .  aspirin EC 81 MG tablet, Take 1 tablet (81 mg total) by mouth daily., Disp: , Rfl:  .  BRILINTA 90 MG TABS tablet, Take 1 tablet (90 mg total) by mouth 2 (two) times daily., Disp: 180 tablet, Rfl: 3 .  guaiFENesin 200 MG tablet, Take by mouth every 4 (four) hours as needed for cough or to loosen phlegm., Disp: , Rfl:  .  isosorbide mononitrate (IMDUR) 30 MG 24 hr tablet, Take 1 tablet (30 mg total) by mouth daily., Disp: 90 tablet, Rfl: 3 .  lansoprazole (PREVACID) 30 MG capsule, Take 1 capsule by mouth daily., Disp: , Rfl: 5 .  lisinopril (PRINIVIL,ZESTRIL) 5 MG tablet, Take 1 tablet (5 mg total) by mouth daily., Disp: 30 tablet, Rfl: 1 .  metFORMIN (GLUCOPHAGE) 1000 MG tablet, Take 1,000 mg by mouth 2 (two) times daily with a meal., Disp: , Rfl:  .  metoprolol tartrate (LOPRESSOR) 50 MG tablet, TAKE 1.5 TABLETS (75 MG TOTAL) BY MOUTH 2 (TWO) TIMES DAILY., Disp: 270 tablet, Rfl: 2 .  nitroGLYCERIN (NITROSTAT) 0.4 MG SL tablet, Place 1 tablet (0.4 mg total) under the tongue every 5 (five) minutes as needed for chest pain., Disp: 25 tablet, Rfl: 11 .  rosuvastatin (CRESTOR) 20 MG tablet, Take 20 mg by mouth daily. , Disp: , Rfl:   Past Medical History: Past Medical History:  Diagnosis Date  . Allergic rhinitis   . Coronary artery disease involving native coronary artery of native heart without angina pectoris 01/07/2017    a - Myoview 12/17:  EF 55, inf-lateral ischemia; intermediate risk // b - LHC 12/05/16: oLM 50, oLAD 40, dLAD 50, RI 25, oLCx 95, mLCx 95, RCA luminal irregs // c - Echo 12/17: EF 55-60, no RWMA, Gr 1 DD // d. 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 DJD (degenerative joint disease)    hips  . ED (erectile dysfunction)   . GERD (gastroesophageal reflux disease)   . Gynecomastia, male   . History of echocardiogram    Echo 12/17: EF 55-60, normal wall motion, grade 1 diastolic dysfunction  . History of nuclear stress test    Myoview 12/17: EF 55, inf-lateral ischemia; intermediate risk  . HLD (hyperlipidemia)    hx of ? elevated LFTs on statin Rx  . Hypertension   . Obesity   . Prostate cancer [facility])    s/p radical prostatectomy  . Spinal stenosis     Tobacco Use: Social History   Tobacco Use  Smoking Status Never Smoker  Smokeless Tobacco Current User  . Types: Chew    Labs: Recent Review Flowsheet Data    Labs for ITP Cardiac and Pulmonary Rehab Latest Ref Rng & Units 12/19/2016 12/19/2016 12/19/2016 12/19/2016 12/20/2016   Hemoglobin A1c 4.8 - 5.6 % - - - - -   PHART 7.350 - 7.450 7.342(L) 7.312(L) -  7.443 -   PCO2ART 32.0 - 48.0 mmHg 40.8 38.4 - 38.6 -   HCO3 20.0 - 28.0 mmol/L 22.6 19.4(L) - 26.2 -   TCO2 0 - 100 mmol/L 24 20 21 27 25    ACIDBASEDEF 0.0 - 2.0 mmol/L 3.0(H) 6.0(H) - - -   O2SAT % 96.0 99.0 - 99.0 -      Capillary Blood Glucose: Lab Results  Component Value Date   GLUCAP 116 (H) 03/13/2017   GLUCAP 102 (H) 03/04/2017   GLUCAP 137 (H) 02/18/2017   GLUCAP 204 (H) 02/15/2017   GLUCAP 98 02/13/2017     Exercise Target Goals: Exercise Program Goal: Individual exercise prescription set using results from initial 6 min walk test and THRR while considering  patient's activity barriers and safety.   Exercise Prescription Goal: Initial exercise prescription builds to 30-45 minutes a  day of aerobic activity, 2-3 days per week.  Home exercise guidelines will be given to patient during program as part of exercise prescription that the participant will acknowledge.  Activity Barriers & Risk Stratification: Activity Barriers & Cardiac Risk Stratification - 12/23/18 1017    Activity Barriers & Cardiac Risk Stratification          Activity Barriers  Back Problems;Arthritis;Joint Problems;Other (comment)    Comments  Torn Rotator Cuff, Stenosis of Spine    Cardiac Risk Stratification  High           6 Minute Walk: 6 Minute Walk    6 Minute Walk    Row Name 12/23/18 1015   Phase  Initial   Distance  1600 feet   Walk Time  6 minutes   # of Rest Breaks  0   MPH  3   METS  3.14   RPE  11   VO2 Peak  11   Symptoms  No   Resting HR  56 bpm   Resting BP  108/74   Resting Oxygen Saturation   99 %   Exercise Oxygen Saturation  during 6 min walk  93 %   Max Ex. HR  90 bpm   Max Ex. BP  130/74   2 Minute Post BP  112/70          Oxygen Initial Assessment:   Oxygen Re-Evaluation:   Oxygen Discharge (Final Oxygen Re-Evaluation):   Initial Exercise Prescription: Initial Exercise Prescription - 12/23/18 1000    Date of Initial Exercise RX and Referring Provider          Date  12/23/18    Referring Provider  Dr. Marlou Porch    Expected Discharge Date  04/03/19        Treadmill          MPH  2.8    Grade  0    Minutes  10        Recumbant Bike          Level  1.5    Watts  30    Minutes  10    METs  3.05        NuStep          Level  3    SPM  85    Minutes  10    METs  3        Prescription Details          Frequency (times per week)  3    Duration  Progress to 30 minutes of continuous aerobic without signs/symptoms of physical distress  Intensity          THRR 40-80% of Max Heartrate  59-118    Ratings of Perceived Exertion  11-13        Progression          Progression  Continue to progress workloads to maintain intensity  without signs/symptoms of physical distress.        Resistance Training          Training Prescription  Yes    Weight  3 lbs.    Reps  10-15           Perform Capillary Blood Glucose checks as needed.  Exercise Prescription Changes:   Exercise Comments:   Exercise Goals and Review: Exercise Goals    Exercise Goals    Row Name 12/23/18 1019   Increase Physical Activity  Yes   Intervention  Develop an individualized exercise prescription for aerobic and resistive training based on initial evaluation findings, risk stratification, comorbidities and participant's personal goals.;Provide advice, education, support and counseling about physical activity/exercise needs.   Expected Outcomes  Short Term: Attend rehab on a regular basis to increase amount of physical activity.   Increase Strength and Stamina  Yes   Intervention  Provide advice, education, support and counseling about physical activity/exercise needs.;Develop an individualized exercise prescription for aerobic and resistive training based on initial evaluation findings, risk stratification, comorbidities and participant's personal goals.   Expected Outcomes  Short Term: Increase workloads from initial exercise prescription for resistance, speed, and METs.   Able to understand and use rate of perceived exertion (RPE) scale  Yes   Intervention  Provide education and explanation on how to use RPE scale   Expected Outcomes  Short Term: Able to use RPE daily in rehab to express subjective intensity level;Long Term:  Able to use RPE to guide intensity level when exercising independently   Knowledge and understanding of Target Heart Rate Range (THRR)  Yes   Intervention  Provide education and explanation of THRR including how the numbers were predicted and where they are located for reference   Expected Outcomes  Short Term: Able to state/look up THRR;Long Term: Able to use THRR to govern intensity when exercising  independently;Short Term: Able to use daily as guideline for intensity in rehab   Able to check pulse independently  Yes   Intervention  Provide education and demonstration on how to check pulse in carotid and radial arteries.;Review the importance of being able to check your own pulse for safety during independent exercise   Expected Outcomes  Short Term: Able to explain why pulse checking is important during independent exercise;Long Term: Able to check pulse independently and accurately   Understanding of Exercise Prescription  Yes   Intervention  Provide education, explanation, and written materials on patient's individual exercise prescription   Expected Outcomes  Short Term: Able to explain program exercise prescription;Long Term: Able to explain home exercise prescription to exercise independently          Exercise Goals Re-Evaluation :   Discharge Exercise Prescription (Final Exercise Prescription Changes):   Nutrition:  Target Goals: Understanding of nutrition guidelines, daily intake of sodium 1500mg , cholesterol 200mg , calories 30% from fat and 7% or less from saturated fats, daily to have 5 or more servings of fruits and vegetables.  Biometrics: Pre Biometrics - 12/23/18 1016    Pre Biometrics          Height  5\' 9"  (1.753 m)    Weight  89.5  kg    Waist Circumference  42 inches    Hip Circumference  45 inches    Waist to Hip Ratio  0.93 %    BMI (Calculated)  29.12    Triceps Skinfold  42 mm    % Body Fat  33.4 %    Grip Strength  46 kg    Flexibility  8 in    Single Leg Stand  2.18 seconds            Nutrition Therapy Plan and Nutrition Goals: Nutrition Therapy & Goals - 12/23/18 1024    Nutrition Therapy          Diet  Carb Modified, heart healthy        Personal Nutrition Goals          Nutrition Goal  Wt loss of 1-2 lb/week to a wt loss goal of 6-24 lb at graduation from [place] Goal #2  Pt to identify and limit sources of  saturated fat, trans fat, and sodium.     Personal Goal #3  Pt to eat more dark green and orange vegetables.        Intervention Plan          Intervention  Prescribe, educate and counsel regarding individualized specific dietary modifications aiming towards targeted core components such as weight, hypertension, lipid management, diabetes, heart failure and other comorbidities.    Expected Outcomes  Short Term Goal: Understand basic principles of dietary content, such as calories, fat, sodium, cholesterol and nutrients.;Long Term Goal: Adherence to prescribed nutrition plan.           Nutrition Assessments: Nutrition Assessments - 12/23/18 1025    MEDFICTS Scores          Pre Score  26           Nutrition Goals Re-Evaluation: Nutrition Goals Re-Evaluation    Goals    Row Name 12/23/18 1024   Current Weight  197 lb 5 oz (89.5 kg)          Nutrition Goals Re-Evaluation: Nutrition Goals Re-Evaluation    Goals    Row Name 12/23/18 1024   Current Weight  197 lb 5 oz (89.5 kg)          Nutrition Goals Discharge (Final Nutrition Goals Re-Evaluation): Nutrition Goals Re-Evaluation - 12/23/18 1024    Goals          Current Weight  197 lb 5 oz (89.5 kg)           Psychosocial: Target Goals: Acknowledge presence or absence of significant depression and/or stress, maximize coping skills, provide positive support system. Participant is able to verbalize types and ability to use techniques and skills needed for reducing stress and depression.  Initial Review & Psychosocial Screening:   Quality of Life Scores: Quality of Life - 12/23/18 1024    Quality of Life          Select  Quality of Life        Quality of Life Scores          Health/Function Pre  23.7 %    Socioeconomic Pre  27.36 %    Psych/Spiritual Pre  23.79 %    Family Pre  30 %    GLOBAL Pre  25.26 %          Scores of 19 and below usually indicate a poorer quality of life in these areas.  A  difference of  2-3 points is a clinically meaningful difference.  A difference of 2-3 points in the total score of the Quality of Life Index has been associated with significant improvement in overall quality of life, self-image, physical symptoms, and general health in studies assessing change in quality of life.  PHQ-9: Recent Review Flowsheet Data    Depression screen [facility] 2/9 05/01/2017 02/11/2017   Decreased Interest 0 0   Down, Depressed, Hopeless 0 0   PHQ - 2 Score 0 0     Interpretation of Total Score  Total Score Depression Severity:  1-4 = Minimal depression, 5-9 = Mild depression, 10-14 = Moderate depression, 15-19 = Moderately severe depression, 20-27 = Severe depression   Psychosocial Evaluation and Intervention:   Psychosocial Re-Evaluation:   Psychosocial Discharge (Final Psychosocial Re-Evaluation):   Vocational Rehabilitation: Provide vocational rehab assistance to qualifying candidates.   Vocational Rehab Evaluation & Intervention:   Education: Education Goals: Education classes will be provided on a weekly basis, covering required topics. Participant will state understanding/return demonstration of topics presented.  Learning Barriers/Preferences: Learning Barriers/Preferences - 12/23/18 1019    Learning Barriers/Preferences          Learning Barriers  Sight    Learning Preferences  Written Material;Individual Instruction;Skilled Demonstration           Education Topics: Count Your Pulse:  -Group instruction provided by verbal instruction, demonstration, patient participation and written materials to support subject.  Instructors address importance of being able to find your pulse and how to count your pulse when at home without a heart monitor.  Patients get hands on experience counting their pulse with staff help and individually. Flowsheet Row CARDIAC REHAB PHASE II EXERCISE from 05/01/2017 in [place]  Date  04/12/17   Instruction Review Code (Retired)  2- meets goals/outcomes      Heart Attack, Angina, and Risk Factor Modification:  -Group instruction provided by verbal instruction, video, and written materials to support subject.  Instructors address signs and symptoms of angina and heart attacks.    Also discuss risk factors for heart disease and how to make changes to improve heart health risk factors. Flowsheet Row CARDIAC REHAB PHASE II EXERCISE from 05/01/2017 in [place]  Date  05/01/17  Instruction Review Code (Retired)  2- meets goals/outcomes      Functional Fitness:  -Group instruction provided by verbal instruction, demonstration, patient participation, and written materials to support subject.  Instructors address safety measures for doing things around the house.  Discuss how to get up and down off the floor, how to pick things up properly, how to safely get out of a chair without assistance, and balance training. Flowsheet Row CARDIAC REHAB PHASE II EXERCISE from 05/01/2017 in [place]  Date  04/26/17  Educator  Cleda Mccreedy, [place]  Instruction Review Code (Retired)  2- meets goals/outcomes      Meditation and Mindfulness:  -Group instruction provided by verbal instruction, patient participation, and written materials to support subject.  Instructor addresses importance of mindfulness and meditation practice to help reduce stress and improve awareness.  Instructor also leads participants through a meditation exercise.  Flowsheet Row CARDIAC REHAB PHASE II EXERCISE from 05/01/2017 in [place]  Date  03/27/17  Instruction Review Code (Retired)  2- Product/process development scientist for Flexibility and Mobility:  -Group instruction provided by verbal instruction, patient participation, and written  materials to support subject.  Instructors lead participants through series of  stretches that are designed to increase flexibility thus improving mobility.  These stretches are additional exercise for major muscle groups that are typically performed during regular warm up and cool down. Flowsheet Row CARDIAC REHAB PHASE II EXERCISE from 05/01/2017 in [place]  Date  04/05/17  Instruction Review Code (Retired)  2- meets goals/outcomes      Hands Only CPR:  -Group verbal, video, and participation provides a basic overview of AHA guidelines for community CPR. Role-play of emergencies allow participants the opportunity to practice calling for help and chest compression technique with discussion of AED use.   Hypertension: -Group verbal and written instruction that provides a basic overview of hypertension including the most recent diagnostic guidelines, risk factor reduction with self-care instructions and medication management.    Nutrition I class: Heart Healthy Eating:  -Group instruction provided by PowerPoint slides, verbal discussion, and written materials to support subject matter. The instructor gives an explanation and review of the Therapeutic Lifestyle Changes diet recommendations, which includes a discussion on lipid goals, dietary fat, sodium, fiber, plant stanol/sterol esters, sugar, and the components of a well-balanced, healthy diet. Flowsheet Row CARDIAC REHAB PHASE II EXERCISE from 05/01/2017 in [place]  Date  02/12/17  Educator  RD  Instruction Review Code (Retired)  2- meets goals/outcomes      Nutrition II class: Lifestyle Skills:  -Group instruction provided by [employer], verbal discussion, and written materials to support subject matter. The instructor gives an explanation and review of label reading, grocery shopping for heart health, heart healthy recipe modifications, and ways to make healthier choices when eating out. Flowsheet Row CARDIAC REHAB PHASE II EXERCISE from  05/01/2017 in [place]  Date  02/19/17  Educator  RD  Instruction Review Code (Retired)  2- meets goals/outcomes      Diabetes Question & Answer:  -Group instruction provided by [employer], verbal discussion, and written materials to support subject matter. The instructor gives an explanation and review of diabetes co-morbidities, pre- and post-prandial blood glucose goals, pre-exercise blood glucose goals, signs, symptoms, and treatment of hypoglycemia and hyperglycemia, and foot care basics. Flowsheet Row CARDIAC REHAB PHASE II EXERCISE from 05/01/2017 in [place]  Date  04/19/17  Educator  RD  Instruction Review Code (Retired)  2- meets goals/outcomes      Diabetes Blitz:  -Group instruction provided by [employer], verbal discussion, and written materials to support subject matter. The instructor gives an explanation and review of the physiology behind type 1 and type 2 diabetes, diabetes medications and rational behind using different medications, pre- and post-prandial blood glucose recommendations and Hemoglobin A1c goals, diabetes diet, and exercise including blood glucose guidelines for exercising safely.  Flowsheet Row CARDIAC REHAB PHASE II EXERCISE from 05/01/2017 in [place]  Date  03/19/17  Educator  RD  Instruction Review Code (Retired)  2- meets goals/outcomes      Portion Distortion:  -Group instruction provided by [employer], verbal discussion, written materials, and food models to support subject matter. The instructor gives an explanation of serving size versus portion size, changes in portions sizes over the last 20 years, and what consists of a serving from each food group. Flowsheet Row CARDIAC REHAB PHASE II EXERCISE from 05/01/2017 in [place]  Date  04/04/17  Educator  RD  Instruction Review Code (Retired)  2-  meets goals/outcomes      Stress Management:  -Group instruction provided by verbal instruction, video, and written materials to support subject matter.  Instructors review role of stress in heart disease and how to cope with stress positively.   Flowsheet Row CARDIAC REHAB PHASE II EXERCISE from 05/01/2017 in [place]  Date  04/10/17  Instruction Review Code (Retired)  2- meets goals/outcomes      Exercising on Your Own:  -Group instruction provided by verbal instruction, power point, and written materials to support subject.  Instructors discuss benefits of exercise, components of exercise, frequency and intensity of exercise, and end points for exercise.  Also discuss use of nitroglycerin and activating EMS.  Review options of places to exercise outside of rehab.  Review guidelines for sex with heart disease. Flowsheet Row CARDIAC REHAB PHASE II EXERCISE from 05/01/2017 in [place]  Date  02/27/17  Educator  Cleda Mccreedy, [place]  Instruction Review Code (Retired)  2- meets goals/outcomes      Cardiac Drugs I:  -Group instruction provided by verbal instruction and written materials to support subject.  Instructor reviews cardiac drug classes: antiplatelets, anticoagulants, beta blockers, and statins.  Instructor discusses reasons, side effects, and lifestyle considerations for each drug class. Flowsheet Row CARDIAC REHAB PHASE II EXERCISE from 05/01/2017 in [place]  Date  03/20/17  Educator  Kennyth Lose  Instruction Review Code (Retired)  2- meets goals/outcomes      Cardiac Drugs II:  -Group instruction provided by verbal instruction and written materials to support subject.  Instructor reviews cardiac drug classes: angiotensin converting enzyme inhibitors (ACE-I), angiotensin II receptor blockers (ARBs), nitrates, and calcium channel blockers.  Instructor discusses reasons, side  effects, and lifestyle considerations for each drug class. Flowsheet Row CARDIAC REHAB PHASE II EXERCISE from 05/01/2017 in [place]  Date  04/17/17  Educator  Kennyth Lose  Instruction Review Code (Retired)  2- Lawyer and Physiology of the Circulatory System:  Group verbal and written instruction and models provide basic cardiac anatomy and physiology, with the coronary electrical and arterial systems. Review of: AMI, Angina, Valve disease, Heart Failure, Peripheral Artery Disease, Cardiac Arrhythmia, Pacemakers, and the ICD. Flowsheet Row CARDIAC REHAB PHASE II EXERCISE from 05/01/2017 in [place]  Date  03/13/17  Instruction Review Code (Retired)  2- meets goals/outcomes      Other Education:  -Group or individual verbal, written, or video instructions that support the educational goals of the cardiac rehab program.   Holiday Eating Survival Tips:  -Group instruction provided by PowerPoint slides, verbal discussion, and written materials to support subject matter. The instructor gives patients tips, tricks, and techniques to help them not only survive but enjoy the holidays despite the onslaught of food that accompanies the holidays.   Knowledge Questionnaire Score: Knowledge Questionnaire Score - 12/23/18 1020    Knowledge Questionnaire Score          Pre Score  22/24           Core Components/Risk Factors/Patient Goals at Admission: Personal Goals and Risk Factors at Admission - 12/23/18 1027    Core Components/Risk Factors/Patient Goals on Admission           Weight Management  Yes;Obesity;Weight Maintenance;Weight Loss    Intervention  Weight Management: Develop a combined nutrition  and exercise program designed to reach desired caloric intake, while maintaining appropriate intake of nutrient and fiber, sodium and fats, and appropriate energy expenditure required for the weight  goal.;Weight Management: Provide education and appropriate resources to help participant work on and attain dietary goals.;Weight Management/Obesity: Establish reasonable short term and long term weight goals.;Obesity: Provide education and appropriate resources to help participant work on and attain dietary goals.    Admit Weight  197 lb 5 oz (89.5 kg)    Expected Outcomes  Short Term: Continue to assess and modify interventions until short term weight is achieved;Long Term: Adherence to nutrition and physical activity/exercise program aimed toward attainment of established weight goal;Weight Maintenance: Understanding of the daily nutrition guidelines, which includes 25-35% calories from fat, 7% or less cal from saturated fats, less than 200mg  cholesterol, less than 1.5gm of sodium, & 5 or more servings of fruits and vegetables daily;Weight Loss: Understanding of general recommendations for a balanced deficit meal plan, which promotes 1-2 lb weight loss per week and includes a negative energy balance of [phone] kcal/d;Understanding recommendations for meals to include 15-35% energy as protein, 25-35% energy from fat, 35-60% energy from carbohydrates, less than 200mg  of dietary cholesterol, 20-35 gm of total fiber daily;Understanding of distribution of calorie intake throughout the day with the consumption of 4-5 meals/snacks    Diabetes  Yes    Intervention  Provide education about signs/symptoms and action to take for hypo/hyperglycemia.;Provide education about proper nutrition, including hydration, and aerobic/resistive exercise prescription along with prescribed medications to achieve blood glucose in normal ranges: Fasting glucose 65-99 mg/dL    Expected Outcomes  Short Term: Participant verbalizes understanding of the signs/symptoms and immediate care of hyper/hypoglycemia, proper foot care and importance of medication, aerobic/resistive exercise and nutrition plan for blood glucose control.;Long Term:  Attainment of HbA1C < 7%.    Hypertension  Yes    Intervention  Provide education on lifestyle modifcations including regular physical activity/exercise, weight management, moderate sodium restriction and increased consumption of fresh fruit, vegetables, and low fat dairy, alcohol moderation, and smoking cessation.;Monitor prescription use compliance.    Expected Outcomes  Short Term: Continued assessment and intervention until BP is < 140/67mm HG in hypertensive participants. < 130/55mm HG in hypertensive participants with diabetes, heart failure or chronic kidney disease.;Long Term: Maintenance of blood pressure at goal levels.    Lipids  Yes    Intervention  Provide education and support for participant on nutrition & aerobic/resistive exercise along with prescribed medications to achieve LDL 70mg , HDL >40mg .    Expected Outcomes  Short Term: Participant states understanding of desired cholesterol values and is compliant with medications prescribed. Participant is following exercise prescription and nutrition guidelines.;Long Term: Cholesterol controlled with medications as prescribed, with individualized exercise RX and with personalized nutrition plan. Value goals: LDL < 70mg , HDL > 40 mg.    Stress  Yes    Intervention  Offer individual and/or small group education and counseling on adjustment to heart disease, stress management and health-related lifestyle change. Teach and support self-help strategies.;Refer participants experiencing significant psychosocial distress to appropriate mental health specialists for further evaluation and treatment. When possible, include family members and significant others in education/counseling sessions.    Expected Outcomes  Short Term: Participant demonstrates changes in health-related behavior, relaxation and other stress management skills, ability to obtain effective social support, and compliance with psychotropic medications if prescribed.;Long Term:  Emotional wellbeing is indicated by absence of clinically significant psychosocial distress or social isolation.  Core Components/Risk Factors/Patient Goals Review:    Core Components/Risk Factors/Patient Goals at Discharge (Final Review):    ITP Comments: ITP Comments    Row Name 12/23/18 0847   ITP Comments  Medical Director- Dr. Fransico Him, MD      Comments: Patient attended orientation from [phone] to [phone]  to review rules and guidelines for program. Completed 6 minute walk test, Intitial ITP, and exercise prescription.  VSS. Telemetry-sinus rhythm.  Asymptomatic.Andi Hence, RN, BSN Cardiac Pulmonary Rehab 12/23/18 11:31 AM

## 2018-12-29 ENCOUNTER — Encounter (HOSPITAL_COMMUNITY)
Admission: RE | Admit: 2018-12-29 | Discharge: 2018-12-29 | Disposition: A | Discharge status: Home / Self Care | Payer: Medicare Other | Source: Ambulatory Visit | Attending: Cardiology | Admitting: Cardiology

## 2018-12-29 ENCOUNTER — Ambulatory Visit (HOSPITAL_COMMUNITY): Payer: Medicare Other

## 2018-12-29 DIAGNOSIS — I214 Non-ST elevation (NSTEMI) myocardial infarction: Secondary | ICD-10-CM

## 2018-12-29 DIAGNOSIS — Z951 Presence of aortocoronary bypass graft: Secondary | ICD-10-CM | POA: Diagnosis not present

## 2018-12-29 DIAGNOSIS — Z955 Presence of coronary angioplasty implant and graft: Secondary | ICD-10-CM

## 2018-12-29 LAB — GLUCOSE, CAPILLARY
GLUCOSE-CAPILLARY: 98 mg/dL (ref 70–99)
Glucose-Capillary: 124 mg/dL — ABNORMAL HIGH (ref 70–99)

## 2018-12-29 NOTE — Progress Notes (Signed)
Daily Session Note  Patient Details  Name: Andrew Williamson MRN: [id number] Date of Birth: 02/26/1946 Referring Provider:     CARDIAC REHAB PHASE II ORIENTATION from 12/23/2018 in [facility]  Referring Provider  Dr. Skains      Encounter Date: 12/29/2018  Check In: Session Check In - 12/29/18 1504      Check-In   Supervising physician immediately available to respond to emergencies  [facility] immediately available    Physician(s)  Dr. Rai     Location  [facility]    Staff Present  Joann Rion, RN, BSN;Maria Whitaker, RN, BSN;Brittany Vance, BS, ACSM CEP, Exercise Physiologist;Tyara Nevels, MS,ACSM CEP, Exercise Physiologist    Medication changes reported      No    Fall or balance concerns reported     No    Tobacco Cessation  No Change    Warm-up and Cool-down  Performed as group-led instruction    Resistance Training Performed  Yes    VAD Patient?  No    PAD/SET Patient?  No      Pain Assessment   Currently in Pain?  No/denies       Capillary Blood Glucose: Results for orders placed or performed during the hospital encounter of 12/29/18 (from the past 24 hour(s))  Glucose, capillary     Status: None   Collection Time: 12/29/18  2:51 PM  Result Value Ref Range   Glucose-Capillary 98 70 - 99 mg/dL  Glucose, capillary     Status: Abnormal   Collection Time: 12/29/18  3:26 PM  Result Value Ref Range   Glucose-Capillary 124 (H) 70 - 99 mg/dL      Social History   Tobacco Use  Smoking Status Never Smoker  Smokeless Tobacco Current User  . Types: Chew    Goals Met:  No report of cardiac concerns or symptoms  Goals Unmet:  Not Applicable  Comments: Andrew Williamson started cardiac rehab today.  Pt tolerated light exercise without difficulty. VSS, telemetry-Sinus Rhythm, asymptomatic.  Medication list reconciled. Pt denies barriers to medicaiton compliance.  PSYCHOSOCIAL ASSESSMENT:  PHQ-0. Pt exhibits positive coping  skills, hopeful outlook with supportive family. No psychosocial needs identified at this time, no psychosocial interventions necessary.    Pt enjoys bowling and square dancing.   Pt oriented to exercise equipment and routine.    Understanding verbalized.Maria Whitaker, RN,BSN 12/29/2018 4:31 PM   Dr. Traci Turner is Medical Director for Cardiac Rehab at [facility]. 

## 2018-12-30 NOTE — Progress Notes (Signed)
Andrew Williamson 72 y.o. male DOB: February 04, 1946 MRN: [id number]      Nutrition Note  1. 10/04/18 NSTEMI   2. 10/06/18 DES OM    Past Medical History:  Diagnosis Date  . Allergic rhinitis   . Coronary artery disease involving native coronary artery of native heart without angina pectoris 01/07/2017   a - Myoview 12/17:  EF 55, inf-lateral ischemia; intermediate risk // b - LHC 12/05/16: oLM 50, oLAD 40, dLAD 50, RI 25, oLCx 95, mLCx 95, RCA luminal irregs // c - Echo 12/17: EF 55-60, no RWMA, Gr 1 DD // d. 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 DJD (degenerative joint disease)    hips  . ED (erectile dysfunction)   . GERD (gastroesophageal reflux disease)   . Gynecomastia, male   . History of echocardiogram    Echo 12/17: EF 55-60, normal wall motion, grade 1 diastolic dysfunction  . History of nuclear stress test    Myoview 12/17: EF 55, inf-lateral ischemia; intermediate risk  . HLD (hyperlipidemia)    hx of ? elevated LFTs on statin Rx  . Hypertension   . Obesity   . Prostate cancer [facility])    s/p radical prostatectomy  . Spinal stenosis    Meds reviewed.   Current Outpatient Medications (Endocrine & Metabolic):  .  metFORMIN (GLUCOPHAGE) 1000 MG tablet, Take 1,000 mg by mouth 2 (two) times daily with a meal.  Current Outpatient Medications (Cardiovascular):  .  isosorbide mononitrate (IMDUR) 30 MG 24 hr tablet, Take 1 tablet (30 mg total) by mouth daily. Marland Kitchen  lisinopril (PRINIVIL,ZESTRIL) 5 MG tablet, Take 1 tablet (5 mg total) by mouth daily. .  metoprolol tartrate (LOPRESSOR) 50 MG tablet, TAKE 1.5 TABLETS (75 MG TOTAL) BY MOUTH 2 (TWO) TIMES DAILY. .  nitroGLYCERIN (NITROSTAT) 0.4 MG SL tablet, Place 1 tablet (0.4 mg total) under the tongue every 5 (five) minutes as needed for chest pain. .  rosuvastatin (CRESTOR) 20 MG tablet, Take 20 mg by mouth daily.   Current Outpatient Medications (Respiratory):  .   guaiFENesin 200 MG tablet, Take by mouth every 4 (four) hours as needed for cough or to loosen phlegm.  Current Outpatient Medications (Analgesics):  .  aspirin EC 81 MG tablet, Take 1 tablet (81 mg total) by mouth daily.  Current Outpatient Medications (Hematological):  Marland Kitchen  BRILINTA 90 MG TABS tablet, Take 1 tablet (90 mg total) by mouth 2 (two) times daily.  Current Outpatient Medications (Other):  .  lansoprazole (PREVACID) 30 MG capsule, Take 1 capsule by mouth daily.   HT: Ht Readings from Last 1 Encounters:  12/23/18 5\' 9"  (1.753 m)    WT: Wt Readings from Last 5 Encounters:  12/23/18 197 lb 5 oz (89.5 kg)  12/17/18 197 lb 1.9 oz (89.4 kg)  10/16/18 197 lb 12.8 oz (89.7 kg)  05/15/18 197 lb 12.8 oz (89.7 kg)  11/06/17 203 lb (92.1 kg)     There is no height or weight on file to calculate BMI.   Current tobacco use? No  Labs:  Lipid Panel  No results found for: CHOL, TRIG, HDL, CHOLHDL, VLDL, LDLCALC, LDLDIRECT  Lab Results  Component Value Date   HGBA1C 6.4 (H) 12/17/2016   CBG (last 3)  Recent Labs    12/29/18 1451 12/29/18 1526  GLUCAP 98 124*   Nutrition Note Spoke with pt. Nutrition plan and goals reviewed with pt.  Pt is following heart healthy diet. Pt wants to lose wt. Pt has not been trying to lose wt. Wt loss tips from orientation reviewed (label reading, how to build a healthy plate, portion sizes, eating frequently across the day). Additionally discussed reducing the frequency of eating out, pt shared he does not cook. Reviewed the benefits of quick and easy meals he can assemble at home. Discussed that cooking is a life skill that can have benefits in helping with weight loss and continuing to follow a heart healthy diabetic eating plan. Pt has Type 2 Diabetes. Last A1c indicates blood glucose well-controlled. Pt checks CBG's 1 times a day. Fasting CBG's reportedly 100-115 mg/dL, when he presented to cardiac rehab for exercise CBG was 98. Reviewed blood  sugar guidelines with patient, and set a goal for patient to have a snack  Per discussion, pt does not use canned/convenience foods often. Pt does not add salt to food. Pt does eat out frequently, ~5 days a week. Pt expressed understanding of the information reviewed. Pt aware of nutrition education classes offered and would like to attend nutrition classes.  Nutrition Diagnosis ? Food-and nutrition-related knowledge deficit related to lack of exposure to information as related to diagnosis of: ? CVD ? Type 2 Diabetes  Nutrition Intervention ? Pt's individual nutrition plan and goals reviewed with pt. ? Pt given handouts for: ? Nutrition I class ? Nutrition II class  ? Diabetes Blitz Class  Nutrition Goal(s):  ? Pt to identify and limit food sources of saturated fat, trans fat, refined carbohydrates and sodium ? Pt to identify food quantities necessary to achieve weight loss of 6-24l bs. at graduation from cardiac rehab.    Plan:  ? Pt to attend nutrition classes ? Nutrition I ? Nutrition II ? Portion Distortion  ? Diabetes Blitz ? Diabetes Q & Ae determined ? Will provide client-centered nutrition education as part of interdisciplinary care ? Monitor and evaluate progress toward nutrition goal with team.   Laurina Bustle, MS, RD, LDN 12/30/2018 10:56 AM

## 2018-12-31 ENCOUNTER — Encounter (HOSPITAL_COMMUNITY)
Admission: RE | Admit: 2018-12-31 | Discharge: 2018-12-31 | Disposition: A | Discharge status: Home / Self Care | Payer: Medicare Other | Source: Ambulatory Visit | Attending: Cardiology | Admitting: Cardiology

## 2018-12-31 ENCOUNTER — Ambulatory Visit (HOSPITAL_COMMUNITY): Payer: Medicare Other

## 2018-12-31 DIAGNOSIS — I214 Non-ST elevation (NSTEMI) myocardial infarction: Secondary | ICD-10-CM

## 2018-12-31 DIAGNOSIS — Z951 Presence of aortocoronary bypass graft: Secondary | ICD-10-CM

## 2018-12-31 DIAGNOSIS — Z955 Presence of coronary angioplasty implant and graft: Secondary | ICD-10-CM | POA: Diagnosis not present

## 2018-12-31 LAB — GLUCOSE, CAPILLARY
GLUCOSE-CAPILLARY: 90 mg/dL (ref 70–99)
GLUCOSE-CAPILLARY: 113 mg/dL — AB (ref 70–99)

## 2019-01-02 ENCOUNTER — Encounter (HOSPITAL_COMMUNITY)
Admission: RE | Admit: 2019-01-02 | Discharge: 2019-01-02 | Disposition: A | Discharge status: Home / Self Care | Payer: Medicare Other | Source: Ambulatory Visit | Attending: Cardiology | Admitting: Cardiology

## 2019-01-02 ENCOUNTER — Ambulatory Visit (HOSPITAL_COMMUNITY): Payer: Medicare Other

## 2019-01-02 DIAGNOSIS — I214 Non-ST elevation (NSTEMI) myocardial infarction: Secondary | ICD-10-CM | POA: Diagnosis not present

## 2019-01-02 DIAGNOSIS — Z955 Presence of coronary angioplasty implant and graft: Secondary | ICD-10-CM | POA: Diagnosis not present

## 2019-01-02 DIAGNOSIS — Z951 Presence of aortocoronary bypass graft: Secondary | ICD-10-CM | POA: Diagnosis not present

## 2019-01-02 LAB — GLUCOSE, CAPILLARY
GLUCOSE-CAPILLARY: 108 mg/dL — AB (ref 70–99)
GLUCOSE-CAPILLARY: 113 mg/dL — AB (ref 70–99)

## 2019-01-05 ENCOUNTER — Encounter: Payer: Self-pay | Admitting: Physician Assistant

## 2019-01-05 ENCOUNTER — Ambulatory Visit (HOSPITAL_COMMUNITY)
Admission: RE | Admit: 2019-01-05 | Discharge: 2019-01-05 | Disposition: A | Discharge status: Home / Self Care | Payer: Medicare Other | Source: Ambulatory Visit | Attending: Family Medicine | Admitting: Family Medicine

## 2019-01-05 ENCOUNTER — Encounter (HOSPITAL_COMMUNITY)
Admission: RE | Admit: 2019-01-05 | Discharge: 2019-01-05 | Disposition: A | Discharge status: Home / Self Care | Payer: Medicare Other | Source: Ambulatory Visit | Attending: Cardiology | Admitting: Cardiology

## 2019-01-05 ENCOUNTER — Ambulatory Visit (HOSPITAL_COMMUNITY): Payer: Medicare Other

## 2019-01-05 ENCOUNTER — Telehealth: Payer: Self-pay | Admitting: Physician Assistant

## 2019-01-05 DIAGNOSIS — Z955 Presence of coronary angioplasty implant and graft: Secondary | ICD-10-CM | POA: Diagnosis not present

## 2019-01-05 DIAGNOSIS — Z951 Presence of aortocoronary bypass graft: Secondary | ICD-10-CM

## 2019-01-05 DIAGNOSIS — I214 Non-ST elevation (NSTEMI) myocardial infarction: Secondary | ICD-10-CM | POA: Diagnosis not present

## 2019-01-05 LAB — GLUCOSE, CAPILLARY: Glucose-Capillary: 106 mg/dL — ABNORMAL HIGH (ref 70–99)

## 2019-01-05 NOTE — Telephone Encounter (Signed)
Andrew Williamson with cardiac rehab called because the patient has been having chest pain on the treadmill and she was concerned that this was angina.  I spoke to Andrew Williamson on the phone. He exercises at a local gym on the days that he is not at cardiac rehab.  At the gym, he is able to manage his activity level and does not have any chest pain.    However, at cardiac rehab, they are pushing his activity level beyond what he does at the gym.  He has had some chest pain with this.  He describes it as a burning.  He has not had any resting symptoms.  He has not had any symptoms with mild-moderate activity.  He states this is different from his MI.  I explained that I felt the best option was for him to be seen in the office.  [name] made an appointment for him with [name], PA-C, whom he knows.  Andrew Williamson is agreeable to keeping the appointment and will come to the ER if he has resting chest pain.  He is encouraged to limit his activity to keep from having chest pain between now and then.  He has nitroglycerin with him.  Rosaria Ferries, PA-C 01/05/2019 3:32 PM Beeper [phone]

## 2019-01-05 NOTE — Progress Notes (Addendum)
Session Note  Patient Details  Name: Andrew Williamson MRN: [id number] Date of Birth: [birth year] Referring Provider:     CARDIAC REHAB PHASE II ORIENTATION from 12/23/2018 in [place]  Referring Provider  Dr. Doran Stabler reported having chest burning on the treadmill today at cardiac rehab. Patient rated his pain a 3/10 on a 1-10 scale. Telemetry rhythm Sinus rhythm rate 77. Oxygen saturation 98% on room air. Blood pressure 128/70. Rhonda Barrett PAC called and notified. Suanne Marker talked with Andrew Williamson over the phone. Suanne Marker gave Andrew Williamson an appointment to see [name] [facility] tomorrow at 1:30 pm. Andrew Williamson denied having any further chest discomfort after getting off the treadmill. 12 Lead ECG obtained. Andrew Williamson completed his cool down stretches after 12 lead ECG obtained. Will continue to monitor the patient throughout  the program.Andrew Lorincz Venetia Maxon, RN,BSN 01/05/2019 3:44 PM

## 2019-01-06 ENCOUNTER — Ambulatory Visit (INDEPENDENT_AMBULATORY_CARE_PROVIDER_SITE_OTHER): Payer: Medicare Other | Admitting: Physician Assistant

## 2019-01-06 ENCOUNTER — Encounter: Payer: Self-pay | Admitting: Physician Assistant

## 2019-01-06 VITALS — BP 112/60 | HR 72 | Ht 69.0 in | Wt 199.0 lb

## 2019-01-06 DIAGNOSIS — I209 Angina pectoris, unspecified: Secondary | ICD-10-CM | POA: Diagnosis not present

## 2019-01-06 DIAGNOSIS — I25119 Atherosclerotic heart disease of native coronary artery with unspecified angina pectoris: Secondary | ICD-10-CM

## 2019-01-06 DIAGNOSIS — E119 Type 2 diabetes mellitus without complications: Secondary | ICD-10-CM | POA: Diagnosis not present

## 2019-01-06 DIAGNOSIS — I10 Essential (primary) hypertension: Secondary | ICD-10-CM | POA: Diagnosis not present

## 2019-01-06 DIAGNOSIS — E7800 Pure hypercholesterolemia, unspecified: Secondary | ICD-10-CM

## 2019-01-06 MED ORDER — ISOSORBIDE MONONITRATE ER 60 MG PO TB24: 60.0000 mg | ORAL_TABLET | Freq: Every day | ORAL | 3 refills | Status: DC

## 2019-01-06 NOTE — Patient Instructions (Addendum)
Medication Instructions:  Your physician has recommended you make the following change in your medication:  1. INCREASE IMDUR TO 60 MG DAILY.   If you need a refill on your cardiac medications before your next appointment, please call your pharmacy.   Lab work: NONE  If you have labs (blood work) drawn today and your tests are completely normal, you will receive your results only by: Marland Kitchen MyChart Message (if you have MyChart) OR . A paper copy in the mail If you have any lab test that is abnormal or we need to change your treatment, we will call you to review the results.  Testing/Procedures: NONE  Follow-Up: At [facility], you and your health needs are our priority.  As part of our continuing mission to provide you with exceptional heart care, we have created designated Provider Care Teams.  These Care Teams include your primary Cardiologist (physician) and Advanced Practice Providers (APPs -  Physician Assistants and Nurse Practitioners) who all work together to provide you with the care you need, when you need it. . You will need a follow up appointment in:  3 -4 weeks You may see Candee Furbish, MD or Maryann Conners, PA-C  Any Other Special Instructions Will Be Listed Below (If Applicable). PLEASE BRING COPY OF RECORDS TO OUR OFFICE FROM [facility].  PER SCOTT WEAVER OKAY TO GO BACK TO CARDIAC REHAB.

## 2019-01-06 NOTE — Progress Notes (Signed)
Cardiology Office Note:    Date:  01/06/2019   ID:  Andrew Williamson, DOB 01-20-1946, MRN [id number]  PCP:  Lujean Amel, MD  Cardiologist:  Candee Furbish, MD  Electrophysiologist:  None   Referring MD: Lujean Amel, MD   Chief Complaint  Patient presents with  . Chest Pain    History of Present Illness:    Andrew Williamson is a 73 y.o. male with  CAD s/p CABG in 12/2016 (Dr. Servando Snare L-LAD, S-RI, S-LCx), HTN, DM2, HL, prostate CA s/p radical prostatectomy in 9/16.  He did have post op AF after his CABG controlled with Amiodarone.   He was admitted to [facility] in [place], [place] in 09/2018 with a NSTEMI.  I cannot find the records in his chart but notes indicate his L-LAD was patent and all vein grafts were occluded.  He underwent PCI with stenting x 2 to the OM.  He was last seen by Dr. Marlou Porch 12/17/2018.  He was having HAs and his Isosorbide was decreased.  He was seen by R. Barrett, PA-C yesterday while in cardiac rehabilitation to evaluate exertional chest pain.    Mr. Ekholm returns for the evaluation of chest pain.  He is here alone.  He previously noted occasional chest burning with more extreme activity.  For example, he would get symptoms during square dancing.  Since decreasing his Isosorbide, he has noted chest pain during his walk on the treadmill.  The pain is not severe (2/10) and goes away quickly.  He denies shortness of breath, syncope, paroxysmal nocturnal dyspnea, leg swelling.  Of note, he has to eat before cardiac rehabilitation to keep his glucose from dropping.  He wonders if some of this may be related to acid reflux.    Prior CV studies:   The following studies were reviewed today:  Myoview 03/28/17 EF 59, inf-lat ischemia, 1 mm J point depression >> down-sloping at peak exercise; High Risk  Carotid US 12/17/16 Bilateral: intimal wall thickening CCA. 1-39% ICA plaquing.  LHC 12/05/16 >> CABG LM ostial 50 LAD ostial 40, distal 50 RI 25 LCx ostial  95, mid 95 RCA luminal irregs EF 55-65  Echo 12/17:  EF 55-60, normal wall motion, grade 1 diastolic dysfunction  Myoview 12/17: EF 55, inf-lateral ischemia; intermediate risk  Past Medical History:  Diagnosis Date  . Allergic rhinitis   . Coronary artery disease involving native coronary artery of native heart without angina pectoris 01/07/2017   a - Myoview 12/17:  EF 55, inf-lateral ischemia; intermediate risk // b - LHC 12/05/16: oLM 50, oLAD 40, dLAD 50, RI 25, oLCx 95, mLCx 95, RCA luminal irregs // c - Echo 12/17: EF 55-60, no RWMA, Gr 1 DD // d. 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 DJD (degenerative joint disease)    hips  . ED (erectile dysfunction)   . GERD (gastroesophageal reflux disease)   . Gynecomastia, male   . History of echocardiogram    Echo 12/17: EF 55-60, normal wall motion, grade 1 diastolic dysfunction  . History of nuclear stress test    Myoview 12/17: EF 55, inf-lateral ischemia; intermediate risk  . HLD (hyperlipidemia)    hx of ? elevated LFTs on statin Rx  . Hypertension   . Obesity   . Prostate cancer [facility])    s/p radical prostatectomy  . Spinal stenosis    Surgical Hx: The patient  has a  past surgical history that includes Robot assisted laparoscopic radical prostatectomy (N/A, 08/11/2015); Laparoscopic cholecystectomy; Colonoscopy (06/2014); Cardiac catheterization (N/A, 12/05/2016); Cardiac catheterization (N/A, 12/05/2016); Coronary artery bypass graft (N/A, 12/19/2016); and TEE without cardioversion (N/A, 12/19/2016).   Current Medications: Current Meds  Medication Sig  . aspirin EC 81 MG tablet Take 1 tablet (81 mg total) by mouth daily.  Marland Kitchen BRILINTA 90 MG TABS tablet Take 1 tablet (90 mg total) by mouth 2 (two) times daily.  . cetirizine (ZYRTEC) 10 MG tablet Take 10 mg by mouth as needed for allergies.  . isosorbide mononitrate (IMDUR) 60 MG 24 hr tablet Take 1 tablet  (60 mg total) by mouth daily.  . lansoprazole (PREVACID) 30 MG capsule Take 1 capsule by mouth daily.  Marland Kitchen lisinopril (PRINIVIL,ZESTRIL) 5 MG tablet Take 1 tablet (5 mg total) by mouth daily.  . metFORMIN (GLUCOPHAGE) 1000 MG tablet Take 1,000 mg by mouth 2 (two) times daily with a meal.  . metoprolol tartrate (LOPRESSOR) 50 MG tablet TAKE 1.5 TABLETS (75 MG TOTAL) BY MOUTH 2 (TWO) TIMES DAILY.  . nitroGLYCERIN (NITROSTAT) 0.4 MG SL tablet Place 1 tablet (0.4 mg total) under the tongue every 5 (five) minutes as needed for chest pain.  . rosuvastatin (CRESTOR) 20 MG tablet Take 20 mg by mouth daily.   . [DISCONTINUED] isosorbide mononitrate (IMDUR) 30 MG 24 hr tablet Take 1 tablet (30 mg total) by mouth daily.     Allergies:   Thimerosal   Social History   Tobacco Use  . Smoking status: Never Smoker  . Smokeless tobacco: Current User    Types: Chew  Substance Use Topics  . Alcohol use: Yes    Comment: rarely  . Drug use: No     Family Hx: The patient's family history includes Heart attack in his maternal uncle; Heart attack (age of onset: 44) in his mother; Heart failure (age of onset: 21) in his father; Peripheral Artery Disease in his father.  ROS:   Please see the history of present illness.    Review of Systems  Cardiovascular: Positive for chest pain.  Respiratory: Positive for cough.   Neurological: Positive for headaches.   All other systems reviewed and are negative.   EKGs/Labs/Other Test Reviewed:    EKG:  EKG is not ordered today.   ECG yesterday (01/05/19): normal sinus rhythm, HR 77, normal axis, no ischemic changes, QTc 445  Recent Labs: No results found for requested labs within last 8760 hours.   Recent Lipid Panel No results found for: CHOL, TRIG, HDL, CHOLHDL, LDLCALC, LDLDIRECT  Physical Exam:    VS:  BP 112/60   Pulse 72   Ht 5\' 9"  (1.753 m)   Wt 199 lb (90.3 kg)   SpO2 98%   BMI 29.39 kg/m     Wt Readings from Last 3 Encounters:  01/06/19 199  lb (90.3 kg)  12/23/18 197 lb 5 oz (89.5 kg)  12/17/18 197 lb 1.9 oz (89.4 kg)     Physical Exam  Constitutional: He is oriented to person, place, and time. He appears well-developed and well-nourished. No distress.  HENT:  Head: Normocephalic and atraumatic.  Eyes: No scleral icterus.  Neck: Neck supple. No JVD present. No thyromegaly present.  Cardiovascular: Normal rate, regular rhythm, S1 normal and S2 normal.  No murmur heard. Pulmonary/Chest: Breath sounds normal. He has no rales.  Abdominal: Soft. There is no hepatomegaly.  Musculoskeletal:        General: No edema.  Lymphadenopathy:  He has no cervical adenopathy.  Neurological: He is alert and oriented to person, place, and time.  Skin: Skin is warm and dry.  Psychiatric: He has a normal mood and affect.    ASSESSMENT & PLAN:    Coronary artery disease involving native coronary artery of native heart with angina pectoris ([place]) Hx of CABG in 2018 and NSTEMI in 09/2018 [facility]) treated with DES x 2 to the OM.  Notes indicate his SVGs were occluded and his LIMA was patent.  He has recently developed increasing amounts of angina since decreasing his dose of Isosorbide.  He was having HAs but these seem to be more related to sinus congestion.  He was on Isosorbide for years without side effects.  We discussed increasing his Isosorbide vs increasing his Metoprolol and decided to start with adjusting his nitrates.  -Increase Isosorbide to 60 mg QD  -Continue ASA, Ticagrelor, Metoprolol, Rosuvastatin  -Consider increasing Metoprolol if symptoms continue   -Could also consider Amlodipine or Ranolazine  -He will bring me a copy of his cath report from [facility] in 09/2018  -We may need to consider Nuclear stress test if his symptoms continue   -He may return to cardiac rehab and continue his exercise   Essential hypertension The patient's blood pressure is controlled on his current regimen.  Continue current therapy.     Pure hypercholesterolemia Continue statin.    Type 2 diabetes mellitus without complication, without long-term current use of insulin ([place]) Managed by primary care.  Empagliflozin could be considered given the results of the EMPA-REG OUTCOME trial.    Dispo:  Return in about 4 weeks (around 02/03/2019) for Close Follow Up w/ Dr. Marlou Porch, or [name], PA-C.   Medication Adjustments/Labs and Tests Ordered: Current medicines are reviewed at length with the patient today.  Concerns regarding medicines are outlined above.  Tests Ordered: No orders of the defined types were placed in this encounter.  Medication Changes: Meds ordered this encounter  Medications  . isosorbide mononitrate (IMDUR) 60 MG 24 hr tablet    Sig: Take 1 tablet (60 mg total) by mouth daily.    Dispense:  90 tablet    Refill:  3    Signed, [name], PA-C  01/06/2019 7:58 PM    [facility] [place], [place], [place]  [address] Phone: [phone]; Fax: [phone]

## 2019-01-07 ENCOUNTER — Encounter (HOSPITAL_COMMUNITY)
Admission: RE | Admit: 2019-01-07 | Discharge: 2019-01-07 | Disposition: A | Discharge status: Home / Self Care | Payer: Medicare Other | Source: Ambulatory Visit | Attending: Cardiology | Admitting: Cardiology

## 2019-01-07 ENCOUNTER — Ambulatory Visit (HOSPITAL_COMMUNITY): Payer: Medicare Other

## 2019-01-07 DIAGNOSIS — Z955 Presence of coronary angioplasty implant and graft: Secondary | ICD-10-CM | POA: Diagnosis not present

## 2019-01-07 DIAGNOSIS — I214 Non-ST elevation (NSTEMI) myocardial infarction: Secondary | ICD-10-CM | POA: Diagnosis not present

## 2019-01-07 DIAGNOSIS — Z951 Presence of aortocoronary bypass graft: Secondary | ICD-10-CM | POA: Diagnosis not present

## 2019-01-07 NOTE — Progress Notes (Signed)
Andrew Williamson returned to exercise today. Patient reported having decreased burning with exercise today. Vital signs stable.Will continue to monitor the patient throughout  the program.Maria Venetia Maxon, RN,BSN 01/07/2019 4:04 PM

## 2019-01-07 NOTE — Progress Notes (Signed)
Reviewed home exercise guidelines with patient including endpoints, temperature precautions, target heart rate and rate of perceived exertion. Pt is exercising at the gym 50 minutes, 2 days/week as his mode of home exercise. Pt voices understanding of instructions given. Sol Passer, MS, ACSM CEP

## 2019-01-09 ENCOUNTER — Encounter (HOSPITAL_COMMUNITY)
Admission: RE | Admit: 2019-01-09 | Discharge: 2019-01-09 | Disposition: A | Discharge status: Home / Self Care | Payer: Medicare Other | Source: Ambulatory Visit | Attending: Cardiology | Admitting: Cardiology

## 2019-01-09 ENCOUNTER — Ambulatory Visit (HOSPITAL_COMMUNITY): Payer: Medicare Other

## 2019-01-09 DIAGNOSIS — Z955 Presence of coronary angioplasty implant and graft: Secondary | ICD-10-CM | POA: Diagnosis not present

## 2019-01-09 DIAGNOSIS — I214 Non-ST elevation (NSTEMI) myocardial infarction: Secondary | ICD-10-CM

## 2019-01-09 DIAGNOSIS — Z951 Presence of aortocoronary bypass graft: Secondary | ICD-10-CM | POA: Diagnosis not present

## 2019-01-12 ENCOUNTER — Ambulatory Visit (HOSPITAL_COMMUNITY): Payer: Medicare Other

## 2019-01-12 ENCOUNTER — Encounter (HOSPITAL_COMMUNITY)
Admission: RE | Admit: 2019-01-12 | Discharge: 2019-01-12 | Disposition: A | Discharge status: Home / Self Care | Payer: Medicare Other | Source: Ambulatory Visit | Attending: Cardiology | Admitting: Cardiology

## 2019-01-12 DIAGNOSIS — Z951 Presence of aortocoronary bypass graft: Secondary | ICD-10-CM | POA: Diagnosis not present

## 2019-01-12 DIAGNOSIS — I214 Non-ST elevation (NSTEMI) myocardial infarction: Secondary | ICD-10-CM | POA: Diagnosis not present

## 2019-01-12 DIAGNOSIS — Z955 Presence of coronary angioplasty implant and graft: Secondary | ICD-10-CM | POA: Insufficient documentation

## 2019-01-14 ENCOUNTER — Ambulatory Visit (HOSPITAL_COMMUNITY): Payer: Medicare Other

## 2019-01-14 ENCOUNTER — Encounter (HOSPITAL_COMMUNITY)
Admission: RE | Admit: 2019-01-14 | Discharge: 2019-01-14 | Disposition: A | Discharge status: Home / Self Care | Payer: Medicare Other | Source: Ambulatory Visit | Attending: Cardiology | Admitting: Cardiology

## 2019-01-14 DIAGNOSIS — I214 Non-ST elevation (NSTEMI) myocardial infarction: Secondary | ICD-10-CM | POA: Diagnosis not present

## 2019-01-14 DIAGNOSIS — Z955 Presence of coronary angioplasty implant and graft: Secondary | ICD-10-CM | POA: Diagnosis not present

## 2019-01-14 DIAGNOSIS — Z951 Presence of aortocoronary bypass graft: Secondary | ICD-10-CM | POA: Diagnosis not present

## 2019-01-15 NOTE — Progress Notes (Signed)
Cardiac Individual Treatment Plan  Patient Details  Name: Andrew Williamson MRN: [id number] Date of Birth: 1946-03-04 Referring Provider:     CARDIAC REHAB PHASE II ORIENTATION from 12/23/2018 in [place]  Referring Provider  Dr. Marlou Porch      Initial Encounter Date:    CARDIAC REHAB PHASE II ORIENTATION from 12/23/2018 in [place]  Date  12/23/18      Visit Diagnosis: 10/04/18 NSTEMI  10/06/18 DES OM  Patient's Home Medications on Admission:  Current Outpatient Medications:  .  aspirin EC 81 MG tablet, Take 1 tablet (81 mg total) by mouth daily., Disp: , Rfl:  .  BRILINTA 90 MG TABS tablet, Take 1 tablet (90 mg total) by mouth 2 (two) times daily., Disp: 180 tablet, Rfl: 3 .  cetirizine (ZYRTEC) 10 MG tablet, Take 10 mg by mouth as needed for allergies., Disp: , Rfl:  .  isosorbide mononitrate (IMDUR) 60 MG 24 hr tablet, Take 1 tablet (60 mg total) by mouth daily., Disp: 90 tablet, Rfl: 3 .  lansoprazole (PREVACID) 30 MG capsule, Take 1 capsule by mouth daily., Disp: , Rfl: 5 .  lisinopril (PRINIVIL,ZESTRIL) 5 MG tablet, Take 1 tablet (5 mg total) by mouth daily., Disp: 30 tablet, Rfl: 1 .  metFORMIN (GLUCOPHAGE) 1000 MG tablet, Take 1,000 mg by mouth 2 (two) times daily with a meal., Disp: , Rfl:  .  metoprolol tartrate (LOPRESSOR) 50 MG tablet, TAKE 1.5 TABLETS (75 MG TOTAL) BY MOUTH 2 (TWO) TIMES DAILY., Disp: 270 tablet, Rfl: 2 .  nitroGLYCERIN (NITROSTAT) 0.4 MG SL tablet, Place 1 tablet (0.4 mg total) under the tongue every 5 (five) minutes as needed for chest pain., Disp: 25 tablet, Rfl: 11 .  rosuvastatin (CRESTOR) 20 MG tablet, Take 20 mg by mouth daily. , Disp: , Rfl:   Past Medical History: Past Medical History:  Diagnosis Date  . Allergic rhinitis   . Coronary artery disease involving native coronary artery of native heart without angina pectoris 01/07/2017   a - Myoview 12/17:  EF 55, inf-lateral  ischemia; intermediate risk // b - LHC 12/05/16: oLM 50, oLAD 40, dLAD 50, RI 25, oLCx 95, mLCx 95, RCA luminal irregs // c - Echo 12/17: EF 55-60, no RWMA, Gr 1 DD // d. 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 DJD (degenerative joint disease)    hips  . ED (erectile dysfunction)   . GERD (gastroesophageal reflux disease)   . Gynecomastia, male   . History of echocardiogram    Echo 12/17: EF 55-60, normal wall motion, grade 1 diastolic dysfunction  . History of nuclear stress test    Myoview 12/17: EF 55, inf-lateral ischemia; intermediate risk  . HLD (hyperlipidemia)    hx of ? elevated LFTs on statin Rx  . Hypertension   . Obesity   . Prostate cancer [facility])    s/p radical prostatectomy  . Spinal stenosis     Tobacco Use: Social History   Tobacco Use  Smoking Status Never Smoker  Smokeless Tobacco Current User  . Types: Chew    Labs: Recent Review Flowsheet Data    Labs for ITP Cardiac and Pulmonary Rehab Latest Ref Rng & Units 12/19/2016 12/19/2016 12/19/2016 12/19/2016 12/20/2016   Hemoglobin A1c 4.8 - 5.6 % - - - - -   PHART 7.350 - 7.450 7.342(L) 7.312(L) - 7.443 -  PCO2ART 32.0 - 48.0 mmHg 40.8 38.4 - 38.6 -   HCO3 20.0 - 28.0 mmol/L 22.6 19.4(L) - 26.2 -   TCO2 0 - 100 mmol/L 24 20 21 27 25    ACIDBASEDEF 0.0 - 2.0 mmol/L 3.0(H) 6.0(H) - - -   O2SAT % 96.0 99.0 - 99.0 -      Capillary Blood Glucose: Lab Results  Component Value Date   GLUCAP 106 (H) 01/05/2019   GLUCAP 113 (H) 01/02/2019   GLUCAP 108 (H) 01/02/2019   GLUCAP 113 (H) 12/31/2018   GLUCAP 90 12/31/2018     Exercise Target Goals: Exercise Program Goal: Individual exercise prescription set using results from initial 6 min walk test and THRR while considering  patient's activity barriers and safety.   Exercise Prescription Goal: Initial exercise prescription builds to 30-45 minutes a day of aerobic activity, 2-3 days per week.   Home exercise guidelines will be given to patient during program as part of exercise prescription that the participant will acknowledge.  Activity Barriers & Risk Stratification: Activity Barriers & Cardiac Risk Stratification - 12/23/18 1017      Activity Barriers & Cardiac Risk Stratification   Activity Barriers  Back Problems;Arthritis;Joint Problems;Other (comment)    Comments  Torn Rotator Cuff, Stenosis of Spine    Cardiac Risk Stratification  High       6 Minute Walk: 6 Minute Walk    Row Name 12/23/18 1015         6 Minute Walk   Phase  Initial     Distance  1600 feet     Walk Time  6 minutes     # of Rest Breaks  0     MPH  3     METS  3.14     RPE  11     VO2 Peak  11     Symptoms  No     Resting HR  56 bpm     Resting BP  108/74     Resting Oxygen Saturation   99 %     Exercise Oxygen Saturation  during 6 min walk  93 %     Max Ex. HR  90 bpm     Max Ex. BP  130/74     2 Minute Post BP  112/70        Oxygen Initial Assessment:   Oxygen Re-Evaluation:   Oxygen Discharge (Final Oxygen Re-Evaluation):   Initial Exercise Prescription: Initial Exercise Prescription - 12/23/18 1000      Date of Initial Exercise RX and Referring Provider   Date  12/23/18    Referring Provider  Dr. Marlou Porch    Expected Discharge Date  04/03/19      Treadmill   MPH  2.8    Grade  0    Minutes  10      Recumbant Bike   Level  1.5    Watts  30    Minutes  10    METs  3.05      NuStep   Level  3    SPM  85    Minutes  10    METs  3      Prescription Details   Frequency (times per week)  3    Duration  Progress to 30 minutes of continuous aerobic without signs/symptoms of physical distress      Intensity   THRR 40-80% of Max Heartrate  59-118    Ratings of Perceived Exertion  11-13      Progression   Progression  Continue to progress workloads to maintain intensity without signs/symptoms of physical distress.      Resistance Training   Training  Prescription  Yes    Weight  3 lbs.    Reps  10-15       Perform Capillary Blood Glucose checks as needed.  Exercise Prescription Changes: Exercise Prescription Changes    Row Name 12/31/18 1449 01/07/19 1457           Response to Exercise   Blood Pressure (Admit)  118/70  120/68      Blood Pressure (Exercise)  138/70  122/80      Blood Pressure (Exit)  112/62  100/70      Heart Rate (Admit)  82 bpm  77 bpm      Heart Rate (Exercise)  107 bpm  98 bpm      Heart Rate (Exit)  76 bpm  70 bpm      Rating of Perceived Exertion (Exercise)  12  12      Symptoms  none  none      Duration  Progress to 30 minutes of  aerobic without signs/symptoms of physical distress  Progress to 30 minutes of  aerobic without signs/symptoms of physical distress      Intensity  THRR unchanged  THRR unchanged        Progression   Progression  Continue to progress workloads to maintain intensity without signs/symptoms of physical distress.  Continue to progress workloads to maintain intensity without signs/symptoms of physical distress.      Average METs  2.6  2.5        Resistance Training   Training Prescription  No Relaxation day, no weights  No Relaxation day, no weights      Weight  -  -      Reps  -  -      Time  -  -        Interval Training   Interval Training  No  No        Treadmill   MPH  2.7  2.5      Grade  0  0      Minutes  10  10      METs  3.07  2.91        Recumbant Bike   Level  1.5  1.5      Watts  22  22      Minutes  10  10      METs  1.9  1.8        NuStep   Level  3  3      SPM  85  85      Minutes  10  10      METs  2.9  2.8        Home Exercise Plan   Plans to continue exercise at  [facility] (comment) Patient using treadmill and recumbent bike at gym.      Frequency  -  Add 2 additional days to program exercise sessions.      Initial Home Exercises Provided  -  01/07/19         Exercise Comments: Exercise Comments    Row Name 01/05/19 1504  01/07/19 1522         Exercise Comments  Reviewed METs with patient.  Reviewed home exercise guidelines, METs, and goals with patient.  Exercise Goals and Review: Exercise Goals    Row Name 12/23/18 1019             Exercise Goals   Increase Physical Activity  Yes       Intervention  Develop an individualized exercise prescription for aerobic and resistive training based on initial evaluation findings, risk stratification, comorbidities and participant's personal goals.;Provide advice, education, support and counseling about physical activity/exercise needs.       Expected Outcomes  Short Term: Attend rehab on a regular basis to increase amount of physical activity.       Increase Strength and Stamina  Yes       Intervention  Provide advice, education, support and counseling about physical activity/exercise needs.;Develop an individualized exercise prescription for aerobic and resistive training based on initial evaluation findings, risk stratification, comorbidities and participant's personal goals.       Expected Outcomes  Short Term: Increase workloads from initial exercise prescription for resistance, speed, and METs.       Able to understand and use rate of perceived exertion (RPE) scale  Yes       Intervention  Provide education and explanation on how to use RPE scale       Expected Outcomes  Short Term: Able to use RPE daily in rehab to express subjective intensity level;Long Term:  Able to use RPE to guide intensity level when exercising independently       Knowledge and understanding of Target Heart Rate Range (THRR)  Yes       Intervention  Provide education and explanation of THRR including how the numbers were predicted and where they are located for reference       Expected Outcomes  Short Term: Able to state/look up THRR;Long Term: Able to use THRR to govern intensity when exercising independently;Short Term: Able to use daily as guideline for intensity in rehab        Able to check pulse independently  Yes       Intervention  Provide education and demonstration on how to check pulse in carotid and radial arteries.;Review the importance of being able to check your own pulse for safety during independent exercise       Expected Outcomes  Short Term: Able to explain why pulse checking is important during independent exercise;Long Term: Able to check pulse independently and accurately       Understanding of Exercise Prescription  Yes       Intervention  Provide education, explanation, and written materials on patient's individual exercise prescription       Expected Outcomes  Short Term: Able to explain program exercise prescription;Long Term: Able to explain home exercise prescription to exercise independently          Exercise Goals Re-Evaluation : Exercise Goals Re-Evaluation    Row Name 01/05/19 1551 01/07/19 1522           Exercise Goal Re-Evaluation   Exercise Goals Review  Able to understand and use rate of perceived exertion (RPE) scale;Increase Physical Activity  Able to understand and use rate of perceived exertion (RPE) scale;Increase Physical Activity;Understanding of Exercise Prescription;Able to check pulse independently;Knowledge and understanding of Target Heart Rate Range (THRR)      Comments  Patient able to understand and use RPE scale appropriately. Pt experienced chest tightness with exercise on the treadmill. Symptoms resolved with rest, 12 lead EKG obtained, cardiology office contacted. Pt was cleared to resume exercise, follow-up appointment with cardiology obtained.  Reviewed home exercise  guidelines with patient including THRR, RPE scale, and endpoints for exercise. Pt is exercising at the gym riding recumbent bike (RB), walking on treadmill (TM), and participating in the [place] program. Pt also bowls on Monday. Pt has a FitBit to check his heart rate.      Expected Outcomes  Patient will exercise at level below which anginal  symptoms occur.  Patient will continue to exercise at the gym on Tuesdays and Thursdays: 30 minutes on RB and 20 minutes on TM, in addition to exercise at cardiac rehab to help improve cardiorespiratory fitness.         Discharge Exercise Prescription (Final Exercise Prescription Changes): Exercise Prescription Changes - 01/07/19 1457      Response to Exercise   Blood Pressure (Admit)  120/68    Blood Pressure (Exercise)  122/80    Blood Pressure (Exit)  100/70    Heart Rate (Admit)  77 bpm    Heart Rate (Exercise)  98 bpm    Heart Rate (Exit)  70 bpm    Rating of Perceived Exertion (Exercise)  12    Symptoms  none    Duration  Progress to 30 minutes of  aerobic without signs/symptoms of physical distress    Intensity  THRR unchanged      Progression   Progression  Continue to progress workloads to maintain intensity without signs/symptoms of physical distress.    Average METs  2.5      Resistance Training   Training Prescription  No   Relaxation day, no weights     Interval Training   Interval Training  No      Treadmill   MPH  2.5    Grade  0    Minutes  10    METs  2.91      Recumbant Bike   Level  1.5    Watts  22    Minutes  10    METs  1.8      NuStep   Level  3    SPM  85    Minutes  10    METs  2.8      Home Exercise Plan   Plans to continue exercise at  [facility] (comment)   Patient using treadmill and recumbent bike at gym.   Frequency  Add 2 additional days to program exercise sessions.    Initial Home Exercises Provided  01/07/19       Nutrition:  Target Goals: Understanding of nutrition guidelines, daily intake of sodium 1500mg , cholesterol 200mg , calories 30% from fat and 7% or less from saturated fats, daily to have 5 or more servings of fruits and vegetables.  Biometrics: Pre Biometrics - 12/23/18 1016      Pre Biometrics   Height  5\' 9"  (1.753 m)    Weight  89.5 kg    Waist Circumference  42 inches    Hip Circumference  45  inches    Waist to Hip Ratio  0.93 %    BMI (Calculated)  29.12    Triceps Skinfold  42 mm    % Body Fat  33.4 %    Grip Strength  46 kg    Flexibility  8 in    Single Leg Stand  2.18 seconds        Nutrition Therapy Plan and Nutrition Goals: Nutrition Therapy & Goals - 12/23/18 1024      Nutrition Therapy   Diet  Carb Modified, heart healthy  Personal Nutrition Goals   Nutrition Goal  Wt loss of 1-2 lb/week to a wt loss goal of 6-24 lb at graduation from [place] Goal #2  Pt to identify and limit sources of saturated fat, trans fat, and sodium.     Personal Goal #3  Pt to eat more dark green and orange vegetables.      Intervention Plan   Intervention  Prescribe, educate and counsel regarding individualized specific dietary modifications aiming towards targeted core components such as weight, hypertension, lipid management, diabetes, heart failure and other comorbidities.    Expected Outcomes  Short Term Goal: Understand basic principles of dietary content, such as calories, fat, sodium, cholesterol and nutrients.;Long Term Goal: Adherence to prescribed nutrition plan.       Nutrition Assessments: Nutrition Assessments - 12/23/18 1025      MEDFICTS Scores   Pre Score  26       Nutrition Goals Re-Evaluation: Nutrition Goals Re-Evaluation    Row Name 12/23/18 1024             Goals   Current Weight  197 lb 5 oz (89.5 kg)          Nutrition Goals Re-Evaluation: Nutrition Goals Re-Evaluation    Row Name 12/23/18 1024             Goals   Current Weight  197 lb 5 oz (89.5 kg)          Nutrition Goals Discharge (Final Nutrition Goals Re-Evaluation): Nutrition Goals Re-Evaluation - 12/23/18 1024      Goals   Current Weight  197 lb 5 oz (89.5 kg)       Psychosocial: Target Goals: Acknowledge presence or absence of significant depression and/or stress, maximize coping skills, provide positive support system. Participant is able to  verbalize types and ability to use techniques and skills needed for reducing stress and depression.  Initial Review & Psychosocial Screening:   Quality of Life Scores: Quality of Life - 12/23/18 1024      Quality of Life   Select  Quality of Life      Quality of Life Scores   Health/Function Pre  23.7 %    Socioeconomic Pre  27.36 %    Psych/Spiritual Pre  23.79 %    Family Pre  30 %    GLOBAL Pre  25.26 %      Scores of 19 and below usually indicate a poorer quality of life in these areas.  A difference of  2-3 points is a clinically meaningful difference.  A difference of 2-3 points in the total score of the Quality of Life Index has been associated with significant improvement in overall quality of life, self-image, physical symptoms, and general health in studies assessing change in quality of life.  PHQ-9: Recent Review Flowsheet Data    Depression screen [facility] 2/9 12/29/2018 05/01/2017 02/11/2017   Decreased Interest 0 0 0   Down, Depressed, Hopeless 0 0 0   PHQ - 2 Score 0 0 0     Interpretation of Total Score  Total Score Depression Severity:  1-4 = Minimal depression, 5-9 = Mild depression, 10-14 = Moderate depression, 15-19 = Moderately severe depression, 20-27 = Severe depression   Psychosocial Evaluation and Intervention:   Psychosocial Re-Evaluation: Psychosocial Re-Evaluation    [place] Name 01/15/19 1512             Psychosocial Re-Evaluation   Current issues with  None Identified  Interventions  Encouraged to attend Cardiac Rehabilitation for the exercise       Continue Psychosocial Services   No Follow up required          Psychosocial Discharge (Final Psychosocial Re-Evaluation): Psychosocial Re-Evaluation - 01/15/19 1512      Psychosocial Re-Evaluation   Current issues with  None Identified    Interventions  Encouraged to attend Cardiac Rehabilitation for the exercise    Continue Psychosocial Services   No Follow up required       Vocational  Rehabilitation: Provide vocational rehab assistance to qualifying candidates.   Vocational Rehab Evaluation & Intervention:   Education: Education Goals: Education classes will be provided on a weekly basis, covering required topics. Participant will state understanding/return demonstration of topics presented.  Learning Barriers/Preferences: Learning Barriers/Preferences - 12/23/18 1019      Learning Barriers/Preferences   Learning Barriers  Sight    Learning Preferences  Written Material;Individual Instruction;Skilled Demonstration       Education Topics: Count Your Pulse:  -Group instruction provided by verbal instruction, demonstration, patient participation and written materials to support subject.  Instructors address importance of being able to find your pulse and how to count your pulse when at home without a heart monitor.  Patients get hands on experience counting their pulse with staff help and individually.   CARDIAC REHAB PHASE II EXERCISE from 01/14/2019 in [place]  Date  01/02/19  Instruction Review Code  2- Demonstrated Understanding      Heart Attack, Angina, and Risk Factor Modification:  -Group instruction provided by verbal instruction, video, and written materials to support subject.  Instructors address signs and symptoms of angina and heart attacks.    Also discuss risk factors for heart disease and how to make changes to improve heart health risk factors.   CARDIAC REHAB PHASE II EXERCISE from 05/01/2017 in [place]  Date  05/01/17  Instruction Review Code (Retired)  2- meets goals/outcomes      Functional Fitness:  -Group instruction provided by verbal instruction, demonstration, patient participation, and written materials to support subject.  Instructors address safety measures for doing things around the house.  Discuss how to get up and down off the floor, how to pick things up properly,  how to safely get out of a chair without assistance, and balance training.   CARDIAC REHAB PHASE II EXERCISE from 05/01/2017 in [place]  Date  04/26/17  Educator  Cleda Mccreedy, [place]  Instruction Review Code (Retired)  2- meets goals/outcomes      Meditation and Mindfulness:  -Group instruction provided by verbal instruction, patient participation, and written materials to support subject.  Instructor addresses importance of mindfulness and meditation practice to help reduce stress and improve awareness.  Instructor also leads participants through a meditation exercise.    CARDIAC REHAB PHASE II EXERCISE from 05/01/2017 in [place]  Date  03/27/17  Instruction Review Code (Retired)  2- meets Insurance account manager for Flexibility and Mobility:  -Group instruction provided by verbal instruction, patient participation, and written materials to support subject.  Instructors lead participants through series of stretches that are designed to increase flexibility thus improving mobility.  These stretches are additional exercise for major muscle groups that are typically performed during regular warm up and cool down.   CARDIAC REHAB PHASE II EXERCISE from 05/01/2017 in [place]  [facility]HAB  Date  04/05/17  Instruction Review Code (Retired)  2- meets goals/outcomes      Hands Only CPR:  -Group verbal, video, and participation provides a basic overview of AHA guidelines for community CPR. Role-play of emergencies allow participants the opportunity to practice calling for help and chest compression technique with discussion of AED use.   Hypertension: -Group verbal and written instruction that provides a basic overview of hypertension including the most recent diagnostic guidelines, risk factor reduction with self-care instructions and medication management.    Nutrition I class: Heart  Healthy Eating:  -Group instruction provided by PowerPoint slides, verbal discussion, and written materials to support subject matter. The instructor gives an explanation and review of the Therapeutic Lifestyle Changes diet recommendations, which includes a discussion on lipid goals, dietary fat, sodium, fiber, plant stanol/sterol esters, sugar, and the components of a well-balanced, healthy diet.   CARDIAC REHAB PHASE II EXERCISE from 05/01/2017 in [place]  Date  02/12/17  Educator  RD  Instruction Review Code (Retired)  2- meets goals/outcomes      Nutrition II class: Lifestyle Skills:  -Group instruction provided by [employer], verbal discussion, and written materials to support subject matter. The instructor gives an explanation and review of label reading, grocery shopping for heart health, heart healthy recipe modifications, and ways to make healthier choices when eating out.   CARDIAC REHAB PHASE II EXERCISE from 05/01/2017 in [place]  Date  02/19/17  Educator  RD  Instruction Review Code (Retired)  2- meets goals/outcomes      Diabetes Question & Answer:  -Group instruction provided by PowerPoint slides, verbal discussion, and written materials to support subject matter. The instructor gives an explanation and review of diabetes co-morbidities, pre- and post-prandial blood glucose goals, pre-exercise blood glucose goals, signs, symptoms, and treatment of hypoglycemia and hyperglycemia, and foot care basics.   CARDIAC REHAB PHASE II EXERCISE from 05/01/2017 in [place]  Date  04/19/17  Educator  RD  Instruction Review Code (Retired)  2- meets goals/outcomes      Diabetes Blitz:  -Group instruction provided by [employer], verbal discussion, and written materials to support subject matter. The instructor gives an explanation and review of the physiology behind type 1 and  type 2 diabetes, diabetes medications and rational behind using different medications, pre- and post-prandial blood glucose recommendations and Hemoglobin A1c goals, diabetes diet, and exercise including blood glucose guidelines for exercising safely.    CARDIAC REHAB PHASE II EXERCISE from 05/01/2017 in [place]  Date  03/19/17  Educator  RD  Instruction Review Code (Retired)  2- meets goals/outcomes      Portion Distortion:  -Group instruction provided by [employer], verbal discussion, written materials, and food models to support subject matter. The instructor gives an explanation of serving size versus portion size, changes in portions sizes over the last 20 years, and what consists of a serving from each food group.   CARDIAC REHAB PHASE II EXERCISE from 05/01/2017 in [place]  Date  04/04/17  Educator  RD  Instruction Review Code (Retired)  2- meets goals/outcomes      Stress Management:  -Group instruction provided by verbal instruction, video, and written materials to support subject matter.  Instructors review role of stress in heart disease and how to cope with stress positively.     CARDIAC REHAB PHASE II  EXERCISE from 05/01/2017 in [place]  Date  04/10/17  Instruction Review Code (Retired)  2- meets goals/outcomes      Exercising on Your Own:  -Group instruction provided by verbal instruction, power point, and written materials to support subject.  Instructors discuss benefits of exercise, components of exercise, frequency and intensity of exercise, and end points for exercise.  Also discuss use of nitroglycerin and activating EMS.  Review options of places to exercise outside of rehab.  Review guidelines for sex with heart disease.   CARDIAC REHAB PHASE II EXERCISE from 05/01/2017 in [place]  Date  02/27/17  Educator  Cleda Mccreedy,  [place]  Instruction Review Code (Retired)  2- meets goals/outcomes      Cardiac Drugs I:  -Group instruction provided by verbal instruction and written materials to support subject.  Instructor reviews cardiac drug classes: antiplatelets, anticoagulants, beta blockers, and statins.  Instructor discusses reasons, side effects, and lifestyle considerations for each drug class.   CARDIAC REHAB PHASE II EXERCISE from 05/01/2017 in [place]  Date  03/20/17  Educator  Kennyth Lose  Instruction Review Code (Retired)  2- meets goals/outcomes      Cardiac Drugs II:  -Group instruction provided by verbal instruction and written materials to support subject.  Instructor reviews cardiac drug classes: angiotensin converting enzyme inhibitors (ACE-I), angiotensin II receptor blockers (ARBs), nitrates, and calcium channel blockers.  Instructor discusses reasons, side effects, and lifestyle considerations for each drug class.   CARDIAC REHAB PHASE II EXERCISE from 05/01/2017 in [place]  Date  04/17/17  Educator  Kennyth Lose  Instruction Review Code (Retired)  2- Lawyer and Physiology of the Circulatory System:  Group verbal and written instruction and models provide basic cardiac anatomy and physiology, with the coronary electrical and arterial systems. Review of: AMI, Angina, Valve disease, Heart Failure, Peripheral Artery Disease, Cardiac Arrhythmia, Pacemakers, and the ICD.   CARDIAC REHAB PHASE II EXERCISE from 01/14/2019 in [facility]  Date  01/14/19  Instruction Review Code  2- Demonstrated Understanding      Other Education:  -Group or individual verbal, written, or video instructions that support the educational goals of the cardiac rehab program.   Holiday Eating Survival Tips:  -Group instruction provided by PowerPoint slides, verbal discussion, and written materials to  support subject matter. The instructor gives patients tips, tricks, and techniques to help them not only survive but enjoy the holidays despite the onslaught of food that accompanies the holidays.   Knowledge Questionnaire Score: Knowledge Questionnaire Score - 12/23/18 1020      Knowledge Questionnaire Score   Pre Score  22/24       Core Components/Risk Factors/Patient Goals at Admission: Personal Goals and Risk Factors at Admission - 12/23/18 1027      Core Components/Risk Factors/Patient Goals on Admission    Weight Management  Yes;Obesity;Weight Maintenance;Weight Loss    Intervention  Weight Management: Develop a combined nutrition and exercise program designed to reach desired caloric intake, while maintaining appropriate intake of nutrient and fiber, sodium and fats, and appropriate energy expenditure required for the weight goal.;Weight Management: Provide education and appropriate resources to help participant work on and attain dietary goals.;Weight Management/Obesity: Establish reasonable short term and long term weight goals.;Obesity: Provide education and appropriate resources to help participant work on and attain dietary goals.  Admit Weight  197 lb 5 oz (89.5 kg)    Expected Outcomes  Short Term: Continue to assess and modify interventions until short term weight is achieved;Long Term: Adherence to nutrition and physical activity/exercise program aimed toward attainment of established weight goal;Weight Maintenance: Understanding of the daily nutrition guidelines, which includes 25-35% calories from fat, 7% or less cal from saturated fats, less than 200mg  cholesterol, less than 1.5gm of sodium, & 5 or more servings of fruits and vegetables daily;Weight Loss: Understanding of general recommendations for a balanced deficit meal plan, which promotes 1-2 lb weight loss per week and includes a negative energy balance of [phone] kcal/d;Understanding recommendations for meals to include  15-35% energy as protein, 25-35% energy from fat, 35-60% energy from carbohydrates, less than 200mg  of dietary cholesterol, 20-35 gm of total fiber daily;Understanding of distribution of calorie intake throughout the day with the consumption of 4-5 meals/snacks    Diabetes  Yes    Intervention  Provide education about signs/symptoms and action to take for hypo/hyperglycemia.;Provide education about proper nutrition, including hydration, and aerobic/resistive exercise prescription along with prescribed medications to achieve blood glucose in normal ranges: Fasting glucose 65-99 mg/dL    Expected Outcomes  Short Term: Participant verbalizes understanding of the signs/symptoms and immediate care of hyper/hypoglycemia, proper foot care and importance of medication, aerobic/resistive exercise and nutrition plan for blood glucose control.;Long Term: Attainment of HbA1C < 7%.    Hypertension  Yes    Intervention  Provide education on lifestyle modifcations including regular physical activity/exercise, weight management, moderate sodium restriction and increased consumption of fresh fruit, vegetables, and low fat dairy, alcohol moderation, and smoking cessation.;Monitor prescription use compliance.    Expected Outcomes  Short Term: Continued assessment and intervention until BP is < 140/36mm HG in hypertensive participants. < 130/40mm HG in hypertensive participants with diabetes, heart failure or chronic kidney disease.;Long Term: Maintenance of blood pressure at goal levels.    Lipids  Yes    Intervention  Provide education and support for participant on nutrition & aerobic/resistive exercise along with prescribed medications to achieve LDL 70mg , HDL >40mg .    Expected Outcomes  Short Term: Participant states understanding of desired cholesterol values and is compliant with medications prescribed. Participant is following exercise prescription and nutrition guidelines.;Long Term: Cholesterol controlled with  medications as prescribed, with individualized exercise RX and with personalized nutrition plan. Value goals: LDL < 70mg , HDL > 40 mg.    Stress  Yes    Intervention  Offer individual and/or small group education and counseling on adjustment to heart disease, stress management and health-related lifestyle change. Teach and support self-help strategies.;Refer participants experiencing significant psychosocial distress to appropriate mental health specialists for further evaluation and treatment. When possible, include family members and significant others in education/counseling sessions.    Expected Outcomes  Short Term: Participant demonstrates changes in health-related behavior, relaxation and other stress management skills, ability to obtain effective social support, and compliance with psychotropic medications if prescribed.;Long Term: Emotional wellbeing is indicated by absence of clinically significant psychosocial distress or social isolation.       Core Components/Risk Factors/Patient Goals Review:  Goals and Risk Factor Review    Row Name 01/15/19 1512             Core Components/Risk Factors/Patient Goals Review   Personal Goals Review  Weight Management/Obesity;Lipids;Hypertension;Diabetes;Stress       Review  Joneen Boers is doing well with exercise at cardiac rehab. [name]'s vital signs and CBG's have been stable. Joneen Boers  has not had any further complaints of chest pain/pressure       Expected Outcomes  pt will continue exercise, nutrition and medication regimen for CAD management.            Core Components/Risk Factors/Patient Goals at Discharge (Final Review):  Goals and Risk Factor Review - 01/15/19 1512      Core Components/Risk Factors/Patient Goals Review   Personal Goals Review  Weight Management/Obesity;Lipids;Hypertension;Diabetes;Stress    Review  Joneen Boers is doing well with exercise at cardiac rehab. [name]'s vital signs and CBG's have been stable. Joneen Boers has not had any  further complaints of chest pain/pressure    Expected Outcomes  pt will continue exercise, nutrition and medication regimen for CAD management.         ITP Comments: ITP Comments    Row Name 12/23/18 0847 01/15/19 1511         ITP Comments  Medical Director- Dr. Fransico Him, MD  30 Day ITP Review. Joneen Boers is with good attendance and partcipation in phase 2 cardiac rehab         Comments: See ITP comments.[name], RN,BSN 01/15/2019 3:18 PM

## 2019-01-16 ENCOUNTER — Ambulatory Visit (HOSPITAL_COMMUNITY): Payer: Medicare Other

## 2019-01-16 ENCOUNTER — Encounter (HOSPITAL_COMMUNITY)
Admission: RE | Admit: 2019-01-16 | Discharge: 2019-01-16 | Disposition: A | Discharge status: Home / Self Care | Payer: Medicare Other | Source: Ambulatory Visit | Attending: Cardiology | Admitting: Cardiology

## 2019-01-16 DIAGNOSIS — Z951 Presence of aortocoronary bypass graft: Secondary | ICD-10-CM

## 2019-01-16 DIAGNOSIS — Z955 Presence of coronary angioplasty implant and graft: Secondary | ICD-10-CM

## 2019-01-16 DIAGNOSIS — I214 Non-ST elevation (NSTEMI) myocardial infarction: Secondary | ICD-10-CM | POA: Diagnosis not present

## 2019-01-19 ENCOUNTER — Ambulatory Visit (HOSPITAL_COMMUNITY): Payer: Medicare Other

## 2019-01-19 ENCOUNTER — Encounter (HOSPITAL_COMMUNITY)
Admission: RE | Admit: 2019-01-19 | Discharge: 2019-01-19 | Disposition: A | Discharge status: Home / Self Care | Payer: Medicare Other | Source: Ambulatory Visit | Attending: Cardiology | Admitting: Cardiology

## 2019-01-19 DIAGNOSIS — Z951 Presence of aortocoronary bypass graft: Secondary | ICD-10-CM | POA: Diagnosis not present

## 2019-01-19 DIAGNOSIS — I214 Non-ST elevation (NSTEMI) myocardial infarction: Secondary | ICD-10-CM

## 2019-01-19 DIAGNOSIS — Z955 Presence of coronary angioplasty implant and graft: Secondary | ICD-10-CM

## 2019-01-21 ENCOUNTER — Encounter (HOSPITAL_COMMUNITY)
Admission: RE | Admit: 2019-01-21 | Discharge: 2019-01-21 | Disposition: A | Discharge status: Home / Self Care | Payer: Medicare Other | Source: Ambulatory Visit | Attending: Cardiology | Admitting: Cardiology

## 2019-01-21 ENCOUNTER — Ambulatory Visit (HOSPITAL_COMMUNITY): Payer: Medicare Other

## 2019-01-21 DIAGNOSIS — Z955 Presence of coronary angioplasty implant and graft: Secondary | ICD-10-CM

## 2019-01-21 DIAGNOSIS — I214 Non-ST elevation (NSTEMI) myocardial infarction: Secondary | ICD-10-CM | POA: Diagnosis not present

## 2019-01-21 DIAGNOSIS — Z951 Presence of aortocoronary bypass graft: Secondary | ICD-10-CM | POA: Diagnosis not present

## 2019-01-23 ENCOUNTER — Ambulatory Visit (HOSPITAL_COMMUNITY): Payer: Medicare Other

## 2019-01-23 ENCOUNTER — Encounter (HOSPITAL_COMMUNITY)
Admission: RE | Admit: 2019-01-23 | Discharge: 2019-01-23 | Disposition: A | Discharge status: Home / Self Care | Payer: Medicare Other | Source: Ambulatory Visit | Attending: Cardiology | Admitting: Cardiology

## 2019-01-23 DIAGNOSIS — I214 Non-ST elevation (NSTEMI) myocardial infarction: Secondary | ICD-10-CM

## 2019-01-23 DIAGNOSIS — Z951 Presence of aortocoronary bypass graft: Secondary | ICD-10-CM

## 2019-01-23 DIAGNOSIS — Z955 Presence of coronary angioplasty implant and graft: Secondary | ICD-10-CM

## 2019-01-26 ENCOUNTER — Ambulatory Visit (HOSPITAL_COMMUNITY): Payer: Medicare Other

## 2019-01-26 ENCOUNTER — Encounter (HOSPITAL_COMMUNITY)
Admission: RE | Admit: 2019-01-26 | Discharge: 2019-01-26 | Disposition: A | Discharge status: Home / Self Care | Payer: Medicare Other | Source: Ambulatory Visit | Attending: Cardiology | Admitting: Cardiology

## 2019-01-26 DIAGNOSIS — Z951 Presence of aortocoronary bypass graft: Secondary | ICD-10-CM | POA: Diagnosis not present

## 2019-01-26 DIAGNOSIS — I214 Non-ST elevation (NSTEMI) myocardial infarction: Secondary | ICD-10-CM | POA: Diagnosis not present

## 2019-01-26 DIAGNOSIS — Z955 Presence of coronary angioplasty implant and graft: Secondary | ICD-10-CM | POA: Diagnosis not present

## 2019-01-28 ENCOUNTER — Ambulatory Visit (HOSPITAL_COMMUNITY): Payer: Medicare Other

## 2019-01-28 ENCOUNTER — Encounter (HOSPITAL_COMMUNITY)
Admission: RE | Admit: 2019-01-28 | Discharge: 2019-01-28 | Disposition: A | Discharge status: Home / Self Care | Payer: Medicare Other | Source: Ambulatory Visit | Attending: Cardiology | Admitting: Cardiology

## 2019-01-28 DIAGNOSIS — Z955 Presence of coronary angioplasty implant and graft: Secondary | ICD-10-CM

## 2019-01-28 DIAGNOSIS — I214 Non-ST elevation (NSTEMI) myocardial infarction: Secondary | ICD-10-CM

## 2019-01-28 DIAGNOSIS — Z951 Presence of aortocoronary bypass graft: Secondary | ICD-10-CM | POA: Diagnosis not present

## 2019-01-30 ENCOUNTER — Encounter: Payer: Self-pay | Admitting: Physician Assistant

## 2019-01-30 ENCOUNTER — Encounter (HOSPITAL_COMMUNITY)
Admission: RE | Admit: 2019-01-30 | Discharge: 2019-01-30 | Disposition: A | Discharge status: Home / Self Care | Payer: Medicare Other | Source: Ambulatory Visit | Attending: Cardiology | Admitting: Cardiology

## 2019-01-30 ENCOUNTER — Ambulatory Visit (HOSPITAL_COMMUNITY): Payer: Medicare Other

## 2019-01-30 ENCOUNTER — Ambulatory Visit (INDEPENDENT_AMBULATORY_CARE_PROVIDER_SITE_OTHER): Payer: Medicare Other | Admitting: Physician Assistant

## 2019-01-30 VITALS — BP 120/66 | HR 71 | Ht 69.5 in | Wt 199.0 lb

## 2019-01-30 DIAGNOSIS — I209 Angina pectoris, unspecified: Secondary | ICD-10-CM

## 2019-01-30 DIAGNOSIS — Z955 Presence of coronary angioplasty implant and graft: Secondary | ICD-10-CM

## 2019-01-30 DIAGNOSIS — I214 Non-ST elevation (NSTEMI) myocardial infarction: Secondary | ICD-10-CM | POA: Diagnosis not present

## 2019-01-30 DIAGNOSIS — Z951 Presence of aortocoronary bypass graft: Secondary | ICD-10-CM | POA: Diagnosis not present

## 2019-01-30 DIAGNOSIS — I25119 Atherosclerotic heart disease of native coronary artery with unspecified angina pectoris: Secondary | ICD-10-CM

## 2019-01-30 DIAGNOSIS — I10 Essential (primary) hypertension: Secondary | ICD-10-CM

## 2019-01-30 NOTE — Progress Notes (Signed)
Cardiology Office Note:    Date:  01/30/2019   ID:  Andrew Williamson, DOB Oct 04, 1946, MRN [id number]  PCP:  Andrew Amel, MD  Cardiologist:  Andrew Furbish, MD   Electrophysiologist:  None   Referring MD: Andrew Amel, MD   Chief Complaint  Patient presents with  . Follow-up    CAD with angina     History of Present Illness:    Andrew Williamson is a 73 y.o. male with CAD s/p CABG in 12/2016 (Dr. Servando Williamson L-LAD, S-RI, S-LCx),HTN, DM2, HL, prostate CA s/p radical prostatectomy in 9/16.He did have post op AF after his CABG controlled with Amiodarone.  He was admitted to [facility] in [place], [place] in 09/2018 with a NSTEMI.  I cannot find the records in his chart but notes indicate his L-LAD was patent and all vein grafts were occluded.  He underwent PCI with stenting x 2 to the OM.  His Isosorbide was decreased in Jan 2020 due to HAs.  He was last seen 01/06/2019 with complaints of increasing chest pain since decreasing his nitrate dose.  We decided to increase his Imdur back to 60 mg QD as he did not feel his HAs were related to nitrate Rx.     Andrew Williamson returns for follow up.  He is here alone.  He continues to have occasional HAs.  These seem to be related to sinus congestion.  He is able to exercise at the gym on his own without exertional chest pain. He does still have chest pain with exercise at cardiac rehabilitation.  He eats before he goes to cardiac rehabilitation and thinks it is related to acid reflux.  He belches with improved symptoms.  He has not had significant shortness of breath, syncope, leg swelling.    Prior CV studies:   The following studies were reviewed today:  Myoview 03/28/17 EF 59, inf-lat ischemia, 1 mm J point depression >> down-sloping at peak exercise; High Risk  Carotid US 12/17/16 Bilateral: intimal wall thickening CCA. 1-39% ICA plaquing.  LHC 12/05/16 >> CABG LM ostial 50 LAD ostial 40, distal 50 RI 25 LCx ostial 95, mid 95 RCA  luminal irregs EF 55-65  Echo 12/17:  EF 55-60, normal wall motion, grade 1 diastolic dysfunction  Myoview 12/17: EF 55, inf-lateral ischemia; intermediate risk  Past Medical History:  Diagnosis Date  . Allergic rhinitis   . Coronary artery disease involving native coronary artery of native heart without angina pectoris 01/07/2017   a - Myoview 12/17:  EF 55, inf-lateral ischemia; intermediate risk // b - LHC 12/05/16: oLM 50, oLAD 40, dLAD 50, RI 25, oLCx 95, mLCx 95, RCA luminal irregs // c - Echo 12/17: EF 55-60, no RWMA, Gr 1 DD // d. 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 DJD (degenerative joint disease)    hips  . ED (erectile dysfunction)   . GERD (gastroesophageal reflux disease)   . Gynecomastia, male   . History of echocardiogram    Echo 12/17: EF 55-60, normal wall motion, grade 1 diastolic dysfunction  . History of nuclear stress test    Myoview 12/17: EF 55, inf-lateral ischemia; intermediate risk  . HLD (hyperlipidemia)    hx of ? elevated LFTs on statin Rx  . Hypertension   . Obesity   . Prostate cancer [facility])    s/p radical prostatectomy  . Spinal stenosis    Surgical Hx: The  patient  has a past surgical history that includes Robot assisted laparoscopic radical prostatectomy (N/A, 08/11/2015); Laparoscopic cholecystectomy; Colonoscopy (06/2014); Cardiac catheterization (N/A, 12/05/2016); Cardiac catheterization (N/A, 12/05/2016); Coronary artery bypass graft (N/A, 12/19/2016); and TEE without cardioversion (N/A, 12/19/2016).   Current Medications: Current Meds  Medication Sig  . aspirin EC 81 MG tablet Take 1 tablet (81 mg total) by mouth daily.  Marland Kitchen BRILINTA 90 MG TABS tablet Take 1 tablet (90 mg total) by mouth 2 (two) times daily.  . cetirizine (ZYRTEC) 10 MG tablet Take 10 mg by mouth as needed for allergies.  . CONTOUR NEXT TEST test strip USE TO TEST SUGAR ONCE A DAY  . isosorbide  mononitrate (IMDUR) 60 MG 24 hr tablet Take 1 tablet (60 mg total) by mouth daily.  . lansoprazole (PREVACID) 30 MG capsule Take 1 capsule by mouth daily.  Marland Kitchen lisinopril (PRINIVIL,ZESTRIL) 5 MG tablet Take 1 tablet (5 mg total) by mouth daily.  . metFORMIN (GLUCOPHAGE) 1000 MG tablet Take 1,000 mg by mouth 2 (two) times daily with a meal.  . metoprolol tartrate (LOPRESSOR) 50 MG tablet TAKE 1.5 TABLETS (75 MG TOTAL) BY MOUTH 2 (TWO) TIMES DAILY.  . nitroGLYCERIN (NITROSTAT) 0.4 MG SL tablet Place 1 tablet (0.4 mg total) under the tongue every 5 (five) minutes as needed for chest pain.  . rosuvastatin (CRESTOR) 20 MG tablet Take 20 mg by mouth daily.      Allergies:   Thimerosal   Social History   Tobacco Use  . Smoking status: Never Smoker  . Smokeless tobacco: Current User    Types: Chew  Substance Use Topics  . Alcohol use: Yes    Comment: rarely  . Drug use: No     Family Hx: The patient's family history includes Heart attack in his maternal uncle; Heart attack (age of onset: 16) in his mother; Heart failure (age of onset: 55) in his father; Peripheral Artery Disease in his father.  ROS:   Please see the history of present illness.    Review of Systems  Cardiovascular: Positive for chest pain.  Hematologic/Lymphatic: Bruises/bleeds easily.  Neurological: Positive for headaches.   All other systems reviewed and are negative.   EKGs/Labs/Other Test Reviewed:    EKG:  EKG is not ordered today.    Recent Labs: No results found for requested labs within last 8760 hours.   Recent Lipid Panel No results found for: CHOL, TRIG, HDL, CHOLHDL, LDLCALC, LDLDIRECT  Physical Exam:    VS:  BP 120/66   Pulse 71   Ht 5' 9.5" (1.765 m)   Wt 199 lb (90.3 kg)   SpO2 99%   BMI 28.97 kg/m     Wt Readings from Last 3 Encounters:  01/30/19 199 lb (90.3 kg)  01/06/19 199 lb (90.3 kg)  12/23/18 197 lb 5 oz (89.5 kg)     Physical Exam  Constitutional: He is oriented to person,  place, and time. He appears well-developed and well-nourished. No distress.  HENT:  Head: Normocephalic and atraumatic.  Neck: Neck supple. No JVD present.  Cardiovascular: Normal rate, regular rhythm, S1 normal and S2 normal.  No murmur heard. Pulmonary/Chest: Breath sounds normal. He has no rales.  Abdominal: Soft. There is no hepatomegaly.  Musculoskeletal:        General: No edema.  Neurological: He is alert and oriented to person, place, and time.  Skin: Skin is warm and dry.    ASSESSMENT & PLAN:    Coronary artery disease involving  native coronary artery of native heart with angina pectoris ([place]) Hx of CABG in 2018 and NSTEMI in 09/2018 [facility]) treated with DES x 2 to the OM.  Notes indicate his SVGs were occluded and his LIMA was patent.  He is currently stable without obvious angina on his medical regimen which includes ASA, Isosorbide, Metoprolol, Brilinta, Rosuvastatin.  His cost for Brilinta was very high but he has met his deductible for the year now.  I did tell him to notify us if he needs to switch to Plavix because of cost.  He will see Dr. Marlou Porch in 09/2019 for follow up.   Essential hypertension The patient's blood pressure is controlled on his current regimen.  Continue current therapy.    Dispo:  Return in about 7 months (around 08/31/2019) for Routine Follow Up w/ Dr. Marlou Porch.   Medication Adjustments/Labs and Tests Ordered: Current medicines are reviewed at length with the patient today.  Concerns regarding medicines are outlined above.  Tests Ordered: No orders of the defined types were placed in this encounter.  Medication Changes: No orders of the defined types were placed in this encounter.   Signed, [name], PA-C  01/30/2019 10:34 AM    [facility] [address], [place], [place]  [address] Phone: [phone]; Fax: [phone]

## 2019-01-30 NOTE — Patient Instructions (Signed)
Medication Instructions:  Your physician recommends that you continue on your current medications as directed. Please refer to the Current Medication list given to you today.  If you need a refill on your cardiac medications before your next appointment, please call your pharmacy.   Lab work: NONE If you have labs (blood work) drawn today and your tests are completely normal, you will receive your results only by: Marland Kitchen MyChart Message (if you have MyChart) OR . A paper copy in the mail If you have any lab test that is abnormal or we need to change your treatment, we will call you to review the results.  Testing/Procedures: NONE  Follow-Up: At [facility], you and your health needs are our priority.  As part of our continuing mission to provide you with exceptional heart care, we have created designated Provider Care Teams.  These Care Teams include your primary Cardiologist (physician) and Advanced Practice Providers (APPs -  Physician Assistants and Nurse Practitioners) who all work together to provide you with the care you need, when you need it. Marland Kitchen PLEASE KEEP YOUR APPOINTMENT WITH DR. Marlou Porch IN October 2020.  Any Other Special Instructions Will Be Listed Below (If Applicable). *PLEASE CALL OUR OFFICE IF YOU NEED TO CHANGE BRILINTA TO PLAVIX BECAUSE OF COST.

## 2019-02-02 ENCOUNTER — Encounter (HOSPITAL_COMMUNITY)
Admission: RE | Admit: 2019-02-02 | Discharge: 2019-02-02 | Disposition: A | Discharge status: Home / Self Care | Payer: Medicare Other | Source: Ambulatory Visit | Attending: Cardiology | Admitting: Cardiology

## 2019-02-02 ENCOUNTER — Ambulatory Visit (HOSPITAL_COMMUNITY): Payer: Medicare Other

## 2019-02-02 DIAGNOSIS — Z951 Presence of aortocoronary bypass graft: Secondary | ICD-10-CM

## 2019-02-02 DIAGNOSIS — I214 Non-ST elevation (NSTEMI) myocardial infarction: Secondary | ICD-10-CM

## 2019-02-02 DIAGNOSIS — Z955 Presence of coronary angioplasty implant and graft: Secondary | ICD-10-CM | POA: Diagnosis not present

## 2019-02-04 ENCOUNTER — Encounter (HOSPITAL_COMMUNITY)
Admission: RE | Admit: 2019-02-04 | Discharge: 2019-02-04 | Disposition: A | Discharge status: Home / Self Care | Payer: Medicare Other | Source: Ambulatory Visit | Attending: Cardiology | Admitting: Cardiology

## 2019-02-04 ENCOUNTER — Ambulatory Visit (HOSPITAL_COMMUNITY): Payer: Medicare Other

## 2019-02-04 DIAGNOSIS — Z951 Presence of aortocoronary bypass graft: Secondary | ICD-10-CM | POA: Diagnosis not present

## 2019-02-04 DIAGNOSIS — I214 Non-ST elevation (NSTEMI) myocardial infarction: Secondary | ICD-10-CM

## 2019-02-04 DIAGNOSIS — Z955 Presence of coronary angioplasty implant and graft: Secondary | ICD-10-CM

## 2019-02-05 NOTE — Progress Notes (Signed)
Cardiac Individual Treatment Plan  Patient Details  Name: Andrew Williamson MRN: [id number] Date of Birth: 1946-12-09 Referring Provider:     CARDIAC REHAB PHASE II ORIENTATION from 12/23/2018 in [place]  Referring Provider  Dr. Marlou Porch      Initial Encounter Date:    CARDIAC REHAB PHASE II ORIENTATION from 12/23/2018 in [place]  Date  12/23/18      Visit Diagnosis: 10/04/18 NSTEMI  10/06/18 DES OM  Patient's Home Medications on Admission:  Current Outpatient Medications:  .  aspirin EC 81 MG tablet, Take 1 tablet (81 mg total) by mouth daily., Disp: , Rfl:  .  BRILINTA 90 MG TABS tablet, Take 1 tablet (90 mg total) by mouth 2 (two) times daily., Disp: 180 tablet, Rfl: 3 .  cetirizine (ZYRTEC) 10 MG tablet, Take 10 mg by mouth as needed for allergies., Disp: , Rfl:  .  CONTOUR NEXT TEST test strip, USE TO TEST SUGAR ONCE A DAY, Disp: , Rfl:  .  isosorbide mononitrate (IMDUR) 60 MG 24 hr tablet, Take 1 tablet (60 mg total) by mouth daily., Disp: 90 tablet, Rfl: 3 .  lansoprazole (PREVACID) 30 MG capsule, Take 1 capsule by mouth daily., Disp: , Rfl: 5 .  lisinopril (PRINIVIL,ZESTRIL) 5 MG tablet, Take 1 tablet (5 mg total) by mouth daily., Disp: 30 tablet, Rfl: 1 .  metFORMIN (GLUCOPHAGE) 1000 MG tablet, Take 1,000 mg by mouth 2 (two) times daily with a meal., Disp: , Rfl:  .  metoprolol tartrate (LOPRESSOR) 50 MG tablet, TAKE 1.5 TABLETS (75 MG TOTAL) BY MOUTH 2 (TWO) TIMES DAILY., Disp: 270 tablet, Rfl: 2 .  nitroGLYCERIN (NITROSTAT) 0.4 MG SL tablet, Place 1 tablet (0.4 mg total) under the tongue every 5 (five) minutes as needed for chest pain., Disp: 25 tablet, Rfl: 11 .  rosuvastatin (CRESTOR) 20 MG tablet, Take 20 mg by mouth daily. , Disp: , Rfl:   Past Medical History: Past Medical History:  Diagnosis Date  . Allergic rhinitis   . CAD (coronary artery disease) 01/07/2017   a - Myoview 12/17:  EF 55,  inf-lateral ischemia; intermediate risk // b - LHC 12/05/16: oLM 50, oLAD 40, dLAD 50, RI 25, oLCx 95, mLCx 95, RCA luminal irregs // c - Echo 12/17: EF 55-60, no RWMA, Gr 1 DD // d. S/p CABG 1/18 (L-LAD, S-RI, S-LCx) // NSTEMI 10.2019 -([place], [place]) - LHC: L-LAD ok, S-LCx and S-RI occluded; PCI: DES x 2 to RI branch of LCx; Echo: EF 60-65  . 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 DJD (degenerative joint disease)    hips  . ED (erectile dysfunction)   . GERD (gastroesophageal reflux disease)   . Gynecomastia, male   . History of echocardiogram    Echo 12/17: EF 55-60, normal wall motion, grade 1 diastolic dysfunction  . History of nuclear stress test    Myoview 12/17: EF 55, inf-lateral ischemia; intermediate risk  . HLD (hyperlipidemia)    hx of ? elevated LFTs on statin Rx  . Hypertension   . Obesity   . Prostate cancer [facility])    s/p radical prostatectomy  . Spinal stenosis     Tobacco Use: Social History   Tobacco Use  Smoking Status Never Smoker  Smokeless Tobacco Current User  . Types: Chew    Labs: Recent Review [employer] manager for [facility] Cardiac and Pulmonary Rehab Latest  Ref Rng & Units 12/19/2016 12/19/2016 12/19/2016 12/19/2016 12/20/2016   Hemoglobin A1c 4.8 - 5.6 % - - - - -   PHART 7.350 - 7.450 7.342(L) 7.312(L) - 7.443 -   PCO2ART 32.0 - 48.0 mmHg 40.8 38.4 - 38.6 -   HCO3 20.0 - 28.0 mmol/L 22.6 19.4(L) - 26.2 -   TCO2 0 - 100 mmol/L 24 20 21 27 25    ACIDBASEDEF 0.0 - 2.0 mmol/L 3.0(H) 6.0(H) - - -   O2SAT % 96.0 99.0 - 99.0 -      Capillary Blood Glucose: Lab Results  Component Value Date   GLUCAP 106 (H) 01/05/2019   GLUCAP 113 (H) 01/02/2019   GLUCAP 108 (H) 01/02/2019   GLUCAP 113 (H) 12/31/2018   GLUCAP 90 12/31/2018     Exercise Target Goals: Exercise Program Goal: Individual exercise prescription set using results from initial 6 min walk test and THRR while considering  patient's activity barriers and  safety.   Exercise Prescription Goal: Initial exercise prescription builds to 30-45 minutes a day of aerobic activity, 2-3 days per week.  Home exercise guidelines will be given to patient during program as part of exercise prescription that the participant will acknowledge.  Activity Barriers & Risk Stratification: Activity Barriers & Cardiac Risk Stratification - 12/23/18 1017      Activity Barriers & Cardiac Risk Stratification   Activity Barriers  Back Problems;Arthritis;Joint Problems;Other (comment)    Comments  Torn Rotator Cuff, Stenosis of Spine    Cardiac Risk Stratification  High       6 Minute Walk: 6 Minute Walk    Row Name 12/23/18 1015         6 Minute Walk   Phase  Initial     Distance  1600 feet     Walk Time  6 minutes     # of Rest Breaks  0     MPH  3     METS  3.14     RPE  11     VO2 Peak  11     Symptoms  No     Resting HR  56 bpm     Resting BP  108/74     Resting Oxygen Saturation   99 %     Exercise Oxygen Saturation  during 6 min walk  93 %     Max Ex. HR  90 bpm     Max Ex. BP  130/74     2 Minute Post BP  112/70        Oxygen Initial Assessment:   Oxygen Re-Evaluation:   Oxygen Discharge (Final Oxygen Re-Evaluation):   Initial Exercise Prescription: Initial Exercise Prescription - 12/23/18 1000      Date of Initial Exercise RX and Referring Provider   Date  12/23/18    Referring Provider  Dr. Marlou Porch    Expected Discharge Date  04/03/19      Treadmill   MPH  2.8    Grade  0    Minutes  10      Recumbant Bike   Level  1.5    Watts  30    Minutes  10    METs  3.05      NuStep   Level  3    SPM  85    Minutes  10    METs  3      Prescription Details   Frequency (times per week)  3    Duration  Progress to 30 minutes of continuous aerobic without signs/symptoms of physical distress      Intensity   THRR 40-80% of Max Heartrate  59-118    Ratings of Perceived Exertion  11-13      Progression   Progression   Continue to progress workloads to maintain intensity without signs/symptoms of physical distress.      Resistance Training   Training Prescription  Yes    Weight  3 lbs.    Reps  10-15       Perform Capillary Blood Glucose checks as needed.  Exercise Prescription Changes:  Exercise Prescription Changes    Row Name 12/31/18 1449 01/07/19 1457 01/19/19 1450 02/02/19 1450       Response to Exercise   Blood Pressure (Admit)  118/70  120/68  104/62  108/50    Blood Pressure (Exercise)  138/70  122/80  130/72  120/78    Blood Pressure (Exit)  112/62  100/70  108/70  112/62    Heart Rate (Admit)  82 bpm  77 bpm  89 bpm  79 bpm    Heart Rate (Exercise)  107 bpm  98 bpm  105 bpm  101 bpm    Heart Rate (Exit)  76 bpm  70 bpm  89 bpm  75 bpm    Rating of Perceived Exertion (Exercise)  12  12  12  12     Symptoms  none  none  none  none    Duration  Progress to 30 minutes of  aerobic without signs/symptoms of physical distress  Progress to 30 minutes of  aerobic without signs/symptoms of physical distress  Progress to 30 minutes of  aerobic without signs/symptoms of physical distress  Progress to 30 minutes of  aerobic without signs/symptoms of physical distress    Intensity  THRR unchanged  THRR unchanged  THRR unchanged  THRR unchanged      Progression   Progression  Continue to progress workloads to maintain intensity without signs/symptoms of physical distress.  Continue to progress workloads to maintain intensity without signs/symptoms of physical distress.  Continue to progress workloads to maintain intensity without signs/symptoms of physical distress.  Continue to progress workloads to maintain intensity without signs/symptoms of physical distress.    Average METs  2.6  2.5  2.7  2.6      Resistance Training   Training Prescription  No Relaxation day, no weights  No Relaxation day, no weights  Yes  Yes    Weight  -  -  3 lbs.  4lbs    Reps  -  -  10-15  10-15    Time  -  -  10 Minutes   10 Minutes      Interval Training   Interval Training  No  No  No  No      Treadmill   MPH  2.7  2.5  2.6  2.6    Grade  0  0  0  0    Minutes  10  10  10  10     METs  3.07  2.91  3.07  2.99      Recumbant Bike   Level  1.5  1.5  1.5  1.5    Watts  22  22  -  -    Minutes  10  10  10  10     METs  1.9  1.8  1.9  1.9      NuStep   Level  3  3  3  3     SPM  85  85  85  85    Minutes  10  10  10  10     METs  2.9  2.8  3.1  3      Home Exercise Plan   Plans to continue exercise at  [facility] (comment) Patient using treadmill and recumbent bike at gym.  [facility] (comment) Patient using treadmill and recumbent bike at gym.  Community Facility (comment) Patient using treadmill and recumbent bike at gym.    Frequency  -  Add 2 additional days to program exercise sessions.  Add 2 additional days to program exercise sessions.  Add 2 additional days to program exercise sessions.    Initial Home Exercises Provided  -  01/07/19  01/07/19  01/07/19       Exercise Comments:  Exercise Comments    Row Name 01/05/19 1504 01/07/19 1522 01/19/19 1450 02/02/19 1506     Exercise Comments  Reviewed METs with patient.  Reviewed home exercise guidelines, METs, and goals with patient.  Reviewed METs and goals with patient.  METs reviewed with patient.       Exercise Goals and Review:  Exercise Goals    Row Name 12/23/18 1019             Exercise Goals   Increase Physical Activity  Yes       Intervention  Develop an individualized exercise prescription for aerobic and resistive training based on initial evaluation findings, risk stratification, comorbidities and participant's personal goals.;Provide advice, education, support and counseling about physical activity/exercise needs.       Expected Outcomes  Short Term: Attend rehab on a regular basis to increase amount of physical activity.       Increase Strength and Stamina  Yes       Intervention  Provide advice,  education, support and counseling about physical activity/exercise needs.;Develop an individualized exercise prescription for aerobic and resistive training based on initial evaluation findings, risk stratification, comorbidities and participant's personal goals.       Expected Outcomes  Short Term: Increase workloads from initial exercise prescription for resistance, speed, and METs.       Able to understand and use rate of perceived exertion (RPE) scale  Yes       Intervention  Provide education and explanation on how to use RPE scale       Expected Outcomes  Short Term: Able to use RPE daily in rehab to express subjective intensity level;Long Term:  Able to use RPE to guide intensity level when exercising independently       Knowledge and understanding of Target Heart Rate Range (THRR)  Yes       Intervention  Provide education and explanation of THRR including how the numbers were predicted and where they are located for reference       Expected Outcomes  Short Term: Able to state/look up THRR;Long Term: Able to use THRR to govern intensity when exercising independently;Short Term: Able to use daily as guideline for intensity in rehab       Able to check pulse independently  Yes       Intervention  Provide education and demonstration on how to check pulse in carotid and radial arteries.;Review the importance of being able to check your own pulse for safety during independent exercise       Expected Outcomes  Short Term: Able to explain why pulse checking  is important during independent exercise;Long Term: Able to check pulse independently and accurately       Understanding of Exercise Prescription  Yes       Intervention  Provide education, explanation, and written materials on patient's individual exercise prescription       Expected Outcomes  Short Term: Able to explain program exercise prescription;Long Term: Able to explain home exercise prescription to exercise independently           Exercise Goals Re-Evaluation : Exercise Goals Re-Evaluation    Row Name 01/05/19 1551 01/07/19 1522 01/19/19 1450         Exercise Goal Re-Evaluation   Exercise Goals Review  Able to understand and use rate of perceived exertion (RPE) scale;Increase Physical Activity  Able to understand and use rate of perceived exertion (RPE) scale;Increase Physical Activity;Understanding of Exercise Prescription;Able to check pulse independently;Knowledge and understanding of Target Heart Rate Range (THRR)  Able to understand and use rate of perceived exertion (RPE) scale;Increase Physical Activity;Understanding of Exercise Prescription;Able to check pulse independently;Knowledge and understanding of Target Heart Rate Range (THRR)     Comments  Patient able to understand and use RPE scale appropriately. Pt experienced chest tightness with exercise on the treadmill. Symptoms resolved with rest, 12 lead EKG obtained, cardiology office contacted. Pt was cleared to resume exercise, follow-up appointment with cardiology obtained.  Reviewed home exercise guidelines with patient including THRR, RPE scale, and endpoints for exercise. Pt is exercising at the gym riding recumbent bike (RB), walking on treadmill (TM), and participating in the [place] program. Pt also bowls on Monday. Pt has a FitBit to check his heart rate.  Patient continues to exercise at gym on Tuesdays and Thursdays: TM 20 mins, bike 30 minutes without angina.     Expected Outcomes  Patient will exercise at level below which anginal symptoms occur.  Patient will continue to exercise at the gym on Tuesdays and Thursdays: 30 minutes on RB and 20 minutes on TM, in addition to exercise at cardiac rehab to help improve cardiorespiratory fitness.  Continue current exercise routine increasing workloads as tolerated.        Discharge Exercise Prescription (Final Exercise Prescription Changes): Exercise Prescription Changes - 02/02/19 1450      Response  to Exercise   Blood Pressure (Admit)  108/50    Blood Pressure (Exercise)  120/78    Blood Pressure (Exit)  112/62    Heart Rate (Admit)  79 bpm    Heart Rate (Exercise)  101 bpm    Heart Rate (Exit)  75 bpm    Rating of Perceived Exertion (Exercise)  12    Symptoms  none    Duration  Progress to 30 minutes of  aerobic without signs/symptoms of physical distress    Intensity  THRR unchanged      Progression   Progression  Continue to progress workloads to maintain intensity without signs/symptoms of physical distress.    Average METs  2.6      Resistance Training   Training Prescription  Yes    Weight  4lbs    Reps  10-15    Time  10 Minutes      Interval Training   Interval Training  No      Treadmill   MPH  2.6    Grade  0    Minutes  10    METs  2.99      Recumbant Bike   Level  1.5    Minutes  10    METs  1.9      NuStep   Level  3    SPM  85    Minutes  10    METs  3      Home Exercise Plan   Plans to continue exercise at  [facility] (comment)   Patient using treadmill and recumbent bike at gym.   Frequency  Add 2 additional days to program exercise sessions.    Initial Home Exercises Provided  01/07/19       Nutrition:  Target Goals: Understanding of nutrition guidelines, daily intake of sodium 1500mg , cholesterol 200mg , calories 30% from fat and 7% or less from saturated fats, daily to have 5 or more servings of fruits and vegetables.  Biometrics: Pre Biometrics - 12/23/18 1016      Pre Biometrics   Height  5\' 9"  (1.753 m)    Weight  197 lb 5 oz (89.5 kg)    Waist Circumference  42 inches    Hip Circumference  45 inches    Waist to Hip Ratio  0.93 %    BMI (Calculated)  29.12    Triceps Skinfold  42 mm    % Body Fat  33.4 %    Grip Strength  46 kg    Flexibility  8 in    Single Leg Stand  2.18 seconds        Nutrition Therapy Plan and Nutrition Goals: Nutrition Therapy & Goals - 12/23/18 1024      Nutrition Therapy   Diet   Carb Modified, heart healthy      Personal Nutrition Goals   Nutrition Goal  Wt loss of 1-2 lb/week to a wt loss goal of 6-24 lb at graduation from [place] Goal #2  Pt to identify and limit sources of saturated fat, trans fat, and sodium.     Personal Goal #3  Pt to eat more dark green and orange vegetables.      Intervention Plan   Intervention  Prescribe, educate and counsel regarding individualized specific dietary modifications aiming towards targeted core components such as weight, hypertension, lipid management, diabetes, heart failure and other comorbidities.    Expected Outcomes  Short Term Goal: Understand basic principles of dietary content, such as calories, fat, sodium, cholesterol and nutrients.;Long Term Goal: Adherence to prescribed nutrition plan.       Nutrition Assessments: Nutrition Assessments - 12/23/18 1025      MEDFICTS Scores   Pre Score  26       Nutrition Goals Re-Evaluation: Nutrition Goals Re-Evaluation    Row Name 12/23/18 1024             Goals   Current Weight  197 lb 5 oz (89.5 kg)          Nutrition Goals Re-Evaluation: Nutrition Goals Re-Evaluation    Row Name 12/23/18 1024             Goals   Current Weight  197 lb 5 oz (89.5 kg)          Nutrition Goals Discharge (Final Nutrition Goals Re-Evaluation): Nutrition Goals Re-Evaluation - 12/23/18 1024      Goals   Current Weight  197 lb 5 oz (89.5 kg)       Psychosocial: Target Goals: Acknowledge presence or absence of significant depression and/or stress, maximize coping skills, provide positive support system. Participant is able to verbalize types and ability to use techniques and skills needed  for reducing stress and depression.  Initial Review & Psychosocial Screening:   Quality of Life Scores: Quality of Life - 12/23/18 1024      Quality of Life   Select  Quality of Life      Quality of Life Scores   Health/Function Pre  23.7 %    Socioeconomic  Pre  27.36 %    Psych/Spiritual Pre  23.79 %    Family Pre  30 %    GLOBAL Pre  25.26 %      Scores of 19 and below usually indicate a poorer quality of life in these areas.  A difference of  2-3 points is a clinically meaningful difference.  A difference of 2-3 points in the total score of the Quality of Life Index has been associated with significant improvement in overall quality of life, self-image, physical symptoms, and general health in studies assessing change in quality of life.  PHQ-9: Recent Review Flowsheet Data    Depression screen [facility] 2/9 12/29/2018 05/01/2017 02/11/2017   Decreased Interest 0 0 0   Down, Depressed, Hopeless 0 0 0   PHQ - 2 Score 0 0 0     Interpretation of Total Score  Total Score Depression Severity:  1-4 = Minimal depression, 5-9 = Mild depression, 10-14 = Moderate depression, 15-19 = Moderately severe depression, 20-27 = Severe depression   Psychosocial Evaluation and Intervention:   Psychosocial Re-Evaluation: Psychosocial Re-Evaluation    [place] Name 01/15/19 1512 02/05/19 1738           Psychosocial Re-Evaluation   Current issues with  None Identified  None Identified      Comments  -  no psychosocial needs identifed, no interventions necessary.       Expected Outcomes  -  pt will deomonstrate positive outlook with good coping skills.        Interventions  Encouraged to attend Cardiac Rehabilitation for the exercise  Encouraged to attend Cardiac Rehabilitation for the exercise      Continue Psychosocial Services   No Follow up required  No Follow up required         Psychosocial Discharge (Final Psychosocial Re-Evaluation): Psychosocial Re-Evaluation - 02/05/19 1738      Psychosocial Re-Evaluation   Current issues with  None Identified    Comments  no psychosocial needs identifed, no interventions necessary.     Expected Outcomes  pt will deomonstrate positive outlook with good coping skills.      Interventions  Encouraged to attend Cardiac  Rehabilitation for the exercise    Continue Psychosocial Services   No Follow up required       Vocational Rehabilitation: Provide vocational rehab assistance to qualifying candidates.   Vocational Rehab Evaluation & Intervention:   Education: Education Goals: Education classes will be provided on a weekly basis, covering required topics. Participant will state understanding/return demonstration of topics presented.  Learning Barriers/Preferences: Learning Barriers/Preferences - 12/23/18 1019      Learning Barriers/Preferences   Learning Barriers  Sight    Learning Preferences  Written Material;Individual Instruction;Skilled Demonstration       Education Topics: Count Your Pulse:  -Group instruction provided by verbal instruction, demonstration, patient participation and written materials to support subject.  Instructors address importance of being able to find your pulse and how to count your pulse when at home without a heart monitor.  Patients get hands on experience counting their pulse with staff help and individually.   CARDIAC REHAB PHASE II EXERCISE from  02/04/2019 in [place]  Date  01/02/19  Instruction Review Code  2- Demonstrated Understanding      Heart Attack, Angina, and Risk Factor Modification:  -Group instruction provided by verbal instruction, video, and written materials to support subject.  Instructors address signs and symptoms of angina and heart attacks.    Also discuss risk factors for heart disease and how to make changes to improve heart health risk factors.   CARDIAC REHAB PHASE II EXERCISE from 05/01/2017 in [place]  Date  05/01/17  Instruction Review Code (Retired)  2- meets goals/outcomes      Functional Fitness:  -Group instruction provided by verbal instruction, demonstration, patient participation, and written materials to support subject.  Instructors address safety measures  for doing things around the house.  Discuss how to get up and down off the floor, how to pick things up properly, how to safely get out of a chair without assistance, and balance training.   CARDIAC REHAB PHASE II EXERCISE from 02/04/2019 in [place]  Date  01/30/19  Educator  EP  Instruction Review Code  2- Demonstrated Understanding      Meditation and Mindfulness:  -Group instruction provided by verbal instruction, patient participation, and written materials to support subject.  Instructor addresses importance of mindfulness and meditation practice to help reduce stress and improve awareness.  Instructor also leads participants through a meditation exercise.    CARDIAC REHAB PHASE II EXERCISE from 05/01/2017 in [place]  Date  03/27/17  Instruction Review Code (Retired)  2- meets Insurance account manager for Flexibility and Mobility:  -Group instruction provided by verbal instruction, patient participation, and written materials to support subject.  Instructors lead participants through series of stretches that are designed to increase flexibility thus improving mobility.  These stretches are additional exercise for major muscle groups that are typically performed during regular warm up and cool down.   CARDIAC REHAB PHASE II EXERCISE from 05/01/2017 in [place]  Date  04/05/17  Instruction Review Code (Retired)  2- meets goals/outcomes      Hands Only CPR:  -Group verbal, video, and participation provides a basic overview of AHA guidelines for community CPR. Role-play of emergencies allow participants the opportunity to practice calling for help and chest compression technique with discussion of AED use.   Hypertension: -Group verbal and written instruction that provides a basic overview of hypertension including the most recent diagnostic guidelines, risk factor reduction with  self-care instructions and medication management.   CARDIAC REHAB PHASE II EXERCISE from 02/04/2019 in [place]  Date  01/16/19  Educator  RN  Instruction Review Code  2- Demonstrated Understanding       Nutrition I class: Heart Healthy Eating:  -Group instruction provided by PowerPoint slides, verbal discussion, and written materials to support subject matter. The instructor gives an explanation and review of the Therapeutic Lifestyle Changes diet recommendations, which includes a discussion on lipid goals, dietary fat, sodium, fiber, plant stanol/sterol esters, sugar, and the components of a well-balanced, healthy diet.   CARDIAC REHAB PHASE II EXERCISE from 05/01/2017 in [place]  Date  02/12/17  Educator  RD  Instruction Review Code (Retired)  2- meets goals/outcomes      Nutrition II class: Lifestyle Skills:  -Group instruction provided by [employer], verbal discussion, and  written materials to support subject matter. The instructor gives an explanation and review of label reading, grocery shopping for heart health, heart healthy recipe modifications, and ways to make healthier choices when eating out.   CARDIAC REHAB PHASE II EXERCISE from 05/01/2017 in [place]  Date  02/19/17  Educator  RD  Instruction Review Code (Retired)  2- meets goals/outcomes      Diabetes Question & Answer:  -Group instruction provided by PowerPoint slides, verbal discussion, and written materials to support subject matter. The instructor gives an explanation and review of diabetes co-morbidities, pre- and post-prandial blood glucose goals, pre-exercise blood glucose goals, signs, symptoms, and treatment of hypoglycemia and hyperglycemia, and foot care basics.   CARDIAC REHAB PHASE II EXERCISE from 05/01/2017 in [place]  Date  04/19/17  Educator  RD  Instruction  Review Code (Retired)  2- meets goals/outcomes      Diabetes Blitz:  -Group instruction provided by [employer], verbal discussion, and written materials to support subject matter. The instructor gives an explanation and review of the physiology behind type 1 and type 2 diabetes, diabetes medications and rational behind using different medications, pre- and post-prandial blood glucose recommendations and Hemoglobin A1c goals, diabetes diet, and exercise including blood glucose guidelines for exercising safely.    CARDIAC REHAB PHASE II EXERCISE from 05/01/2017 in [place]  Date  03/19/17  Educator  RD  Instruction Review Code (Retired)  2- meets goals/outcomes      Portion Distortion:  -Group instruction provided by [employer], verbal discussion, written materials, and food models to support subject matter. The instructor gives an explanation of serving size versus portion size, changes in portions sizes over the last 20 years, and what consists of a serving from each food group.   CARDIAC REHAB PHASE II EXERCISE from 05/01/2017 in [place]  Date  04/04/17  Educator  RD  Instruction Review Code (Retired)  2- meets goals/outcomes      Stress Management:  -Group instruction provided by verbal instruction, video, and written materials to support subject matter.  Instructors review role of stress in heart disease and how to cope with stress positively.     CARDIAC REHAB PHASE II EXERCISE from 05/01/2017 in [place]  Date  04/10/17  Instruction Review Code (Retired)  2- meets goals/outcomes      Exercising on Your Own:  -Group instruction provided by verbal instruction, power point, and written materials to support subject.  Instructors discuss benefits of exercise, components of exercise, frequency and intensity of exercise, and end points for exercise.  Also discuss use of  nitroglycerin and activating EMS.  Review options of places to exercise outside of rehab.  Review guidelines for sex with heart disease.   CARDIAC REHAB PHASE II EXERCISE from 02/04/2019 in [place]  Date  02/04/19  Instruction Review Code  2- Demonstrated Understanding      Cardiac Drugs I:  -Group instruction provided by verbal instruction and written materials to support subject.  Instructor reviews cardiac drug classes: antiplatelets, anticoagulants, beta blockers, and statins.  Instructor discusses reasons, side effects, and lifestyle considerations for each drug class.   CARDIAC REHAB PHASE II EXERCISE from 05/01/2017 in [place]  Date  03/20/17  Educator  Kennyth Lose  Instruction Review Code (Retired)  2- meets goals/outcomes  Cardiac Drugs II:  -Group instruction provided by verbal instruction and written materials to support subject.  Instructor reviews cardiac drug classes: angiotensin converting enzyme inhibitors (ACE-I), angiotensin II receptor blockers (ARBs), nitrates, and calcium channel blockers.  Instructor discusses reasons, side effects, and lifestyle considerations for each drug class.   CARDIAC REHAB PHASE II EXERCISE from 05/01/2017 in [place]  Date  04/17/17  Educator  Kennyth Lose  Instruction Review Code (Retired)  2- Lawyer and Physiology of the Circulatory System:  Group verbal and written instruction and models provide basic cardiac anatomy and physiology, with the coronary electrical and arterial systems. Review of: AMI, Angina, Valve disease, Heart Failure, Peripheral Artery Disease, Cardiac Arrhythmia, Pacemakers, and the ICD.   CARDIAC REHAB PHASE II EXERCISE from 02/04/2019 in [place]  Date  01/14/19  Instruction Review Code  2- Demonstrated Understanding      Other Education:  -Group or individual  verbal, written, or video instructions that support the educational goals of the cardiac rehab program.   Holiday Eating Survival Tips:  -Group instruction provided by PowerPoint slides, verbal discussion, and written materials to support subject matter. The instructor gives patients tips, tricks, and techniques to help them not only survive but enjoy the holidays despite the onslaught of food that accompanies the holidays.   Knowledge Questionnaire Score: Knowledge Questionnaire Score - 12/23/18 1020      Knowledge Questionnaire Score   Pre Score  22/24       Core Components/Risk Factors/Patient Goals at Admission: Personal Goals and Risk Factors at Admission - 12/23/18 1027      Core Components/Risk Factors/Patient Goals on Admission    Weight Management  Yes;Obesity;Weight Maintenance;Weight Loss    Intervention  Weight Management: Develop a combined nutrition and exercise program designed to reach desired caloric intake, while maintaining appropriate intake of nutrient and fiber, sodium and fats, and appropriate energy expenditure required for the weight goal.;Weight Management: Provide education and appropriate resources to help participant work on and attain dietary goals.;Weight Management/Obesity: Establish reasonable short term and long term weight goals.;Obesity: Provide education and appropriate resources to help participant work on and attain dietary goals.    Admit Weight  197 lb 5 oz (89.5 kg)    Expected Outcomes  Short Term: Continue to assess and modify interventions until short term weight is achieved;Long Term: Adherence to nutrition and physical activity/exercise program aimed toward attainment of established weight goal;Weight Maintenance: Understanding of the daily nutrition guidelines, which includes 25-35% calories from fat, 7% or less cal from saturated fats, less than 200mg  cholesterol, less than 1.5gm of sodium, & 5 or more servings of fruits and vegetables  daily;Weight Loss: Understanding of general recommendations for a balanced deficit meal plan, which promotes 1-2 lb weight loss per week and includes a negative energy balance of [phone] kcal/d;Understanding recommendations for meals to include 15-35% energy as protein, 25-35% energy from fat, 35-60% energy from carbohydrates, less than 200mg  of dietary cholesterol, 20-35 gm of total fiber daily;Understanding of distribution of calorie intake throughout the day with the consumption of 4-5 meals/snacks    Diabetes  Yes    Intervention  Provide education about signs/symptoms and action to take for hypo/hyperglycemia.;Provide education about proper nutrition, including hydration, and aerobic/resistive exercise prescription along with prescribed medications to achieve blood glucose in normal ranges: Fasting glucose 65-99 mg/dL    Expected Outcomes  Short Term: Participant verbalizes understanding of the  signs/symptoms and immediate care of hyper/hypoglycemia, proper foot care and importance of medication, aerobic/resistive exercise and nutrition plan for blood glucose control.;Long Term: Attainment of HbA1C < 7%.    Hypertension  Yes    Intervention  Provide education on lifestyle modifcations including regular physical activity/exercise, weight management, moderate sodium restriction and increased consumption of fresh fruit, vegetables, and low fat dairy, alcohol moderation, and smoking cessation.;Monitor prescription use compliance.    Expected Outcomes  Short Term: Continued assessment and intervention until BP is < 140/50mm HG in hypertensive participants. < 130/75mm HG in hypertensive participants with diabetes, heart failure or chronic kidney disease.;Long Term: Maintenance of blood pressure at goal levels.    Lipids  Yes    Intervention  Provide education and support for participant on nutrition & aerobic/resistive exercise along with prescribed medications to achieve LDL 70mg , HDL >40mg .    Expected  Outcomes  Short Term: Participant states understanding of desired cholesterol values and is compliant with medications prescribed. Participant is following exercise prescription and nutrition guidelines.;Long Term: Cholesterol controlled with medications as prescribed, with individualized exercise RX and with personalized nutrition plan. Value goals: LDL < 70mg , HDL > 40 mg.    Stress  Yes    Intervention  Offer individual and/or small group education and counseling on adjustment to heart disease, stress management and health-related lifestyle change. Teach and support self-help strategies.;Refer participants experiencing significant psychosocial distress to appropriate mental health specialists for further evaluation and treatment. When possible, include family members and significant others in education/counseling sessions.    Expected Outcomes  Short Term: Participant demonstrates changes in health-related behavior, relaxation and other stress management skills, ability to obtain effective social support, and compliance with psychotropic medications if prescribed.;Long Term: Emotional wellbeing is indicated by absence of clinically significant psychosocial distress or social isolation.       Core Components/Risk Factors/Patient Goals Review:  Goals and Risk Factor Review    Row Name 01/15/19 1512 02/05/19 1739           Core Components/Risk Factors/Patient Goals Review   Personal Goals Review  Weight Management/Obesity;Lipids;Hypertension;Diabetes;Stress  Weight Management/Obesity;Lipids;Hypertension;Diabetes;Stress      Review  Andrew Williamson is doing well with exercise at cardiac rehab. Andrew Williamson's vital signs and CBG's have been stable. Andrew Williamson has not had any further complaints of chest pain/pressure  Andrew Williamson is doing well with exercise at cardiac rehab. Andrew Williamson's vital signs and CBG's have been stable. Will continue to monitor for symptoms      Expected Outcomes  pt will continue exercise, nutrition and  medication regimen for CAD management.    pt will continue exercise, nutrition and medication regimen for CAD management.           Core Components/Risk Factors/Patient Goals at Discharge (Final Review):  Goals and Risk Factor Review - 02/05/19 1739      Core Components/Risk Factors/Patient Goals Review   Personal Goals Review  Weight Management/Obesity;Lipids;Hypertension;Diabetes;Stress    Review  Andrew Williamson is doing well with exercise at cardiac rehab. Andrew Williamson's vital signs and CBG's have been stable. Will continue to monitor for symptoms    Expected Outcomes  pt will continue exercise, nutrition and medication regimen for CAD management.         ITP Comments: ITP Comments    Row Name 12/23/18 0847 01/15/19 1511 02/05/19 1738       ITP Comments  Medical Director- Dr. Fransico Him, MD  30 Day ITP Review. Andrew Williamson is with good attendance and partcipation in phase 2 cardiac rehab  30 Day ITP Review. Andrew Williamson is with good attendance and partcipation in phase 2 cardiac rehab        Comments: See ITP comments.[name], RN,BSN 02/12/2019 10:38 AM

## 2019-02-06 ENCOUNTER — Encounter (HOSPITAL_COMMUNITY)
Admission: RE | Admit: 2019-02-06 | Discharge: 2019-02-06 | Disposition: A | Discharge status: Home / Self Care | Payer: Medicare Other | Source: Ambulatory Visit | Attending: Cardiology | Admitting: Cardiology

## 2019-02-06 ENCOUNTER — Ambulatory Visit (HOSPITAL_COMMUNITY): Payer: Medicare Other

## 2019-02-06 DIAGNOSIS — Z955 Presence of coronary angioplasty implant and graft: Secondary | ICD-10-CM

## 2019-02-06 DIAGNOSIS — Z951 Presence of aortocoronary bypass graft: Secondary | ICD-10-CM

## 2019-02-06 DIAGNOSIS — I214 Non-ST elevation (NSTEMI) myocardial infarction: Secondary | ICD-10-CM

## 2019-02-09 ENCOUNTER — Ambulatory Visit (HOSPITAL_COMMUNITY): Payer: Medicare Other

## 2019-02-09 ENCOUNTER — Encounter (HOSPITAL_COMMUNITY)
Admission: RE | Admit: 2019-02-09 | Discharge: 2019-02-09 | Disposition: A | Discharge status: Home / Self Care | Payer: Medicare Other | Source: Ambulatory Visit | Attending: Cardiology | Admitting: Cardiology

## 2019-02-09 DIAGNOSIS — Z951 Presence of aortocoronary bypass graft: Secondary | ICD-10-CM | POA: Insufficient documentation

## 2019-02-09 DIAGNOSIS — Z955 Presence of coronary angioplasty implant and graft: Secondary | ICD-10-CM | POA: Insufficient documentation

## 2019-02-09 DIAGNOSIS — I214 Non-ST elevation (NSTEMI) myocardial infarction: Secondary | ICD-10-CM | POA: Diagnosis not present

## 2019-02-10 ENCOUNTER — Encounter: Payer: Self-pay | Admitting: Physician Assistant

## 2019-02-10 NOTE — Progress Notes (Signed)
Notes reviewed from [facility], [place], [place] [address]:  Cardiac Catheterization 10/06/2018 LM normal  LAD patent LCx ostial 80-90 RCA prox 30 L-LAD patent S-RI occluded S-LCx occluded EF 69 PCI:  2.5 x 18 mm and 2.5 x 8 mm Resolute Onyx DES to RI branch of the LCx  Echo 10/05/18 EF 60-65

## 2019-02-11 ENCOUNTER — Encounter (HOSPITAL_COMMUNITY)
Admission: RE | Admit: 2019-02-11 | Discharge: 2019-02-11 | Disposition: A | Discharge status: Home / Self Care | Payer: Medicare Other | Source: Ambulatory Visit | Attending: Cardiology | Admitting: Cardiology

## 2019-02-11 ENCOUNTER — Ambulatory Visit (HOSPITAL_COMMUNITY): Payer: Medicare Other

## 2019-02-11 DIAGNOSIS — Z951 Presence of aortocoronary bypass graft: Secondary | ICD-10-CM | POA: Diagnosis not present

## 2019-02-11 DIAGNOSIS — Z955 Presence of coronary angioplasty implant and graft: Secondary | ICD-10-CM

## 2019-02-11 DIAGNOSIS — I214 Non-ST elevation (NSTEMI) myocardial infarction: Secondary | ICD-10-CM | POA: Diagnosis not present

## 2019-02-13 ENCOUNTER — Encounter (HOSPITAL_COMMUNITY)
Admission: RE | Admit: 2019-02-13 | Discharge: 2019-02-13 | Disposition: A | Discharge status: Home / Self Care | Payer: Medicare Other | Source: Ambulatory Visit | Attending: Cardiology | Admitting: Cardiology

## 2019-02-13 ENCOUNTER — Ambulatory Visit (HOSPITAL_COMMUNITY): Payer: Medicare Other

## 2019-02-13 DIAGNOSIS — Z951 Presence of aortocoronary bypass graft: Secondary | ICD-10-CM | POA: Diagnosis not present

## 2019-02-13 DIAGNOSIS — Z955 Presence of coronary angioplasty implant and graft: Secondary | ICD-10-CM

## 2019-02-13 DIAGNOSIS — I214 Non-ST elevation (NSTEMI) myocardial infarction: Secondary | ICD-10-CM

## 2019-02-16 ENCOUNTER — Ambulatory Visit (HOSPITAL_COMMUNITY): Payer: Medicare Other

## 2019-02-16 ENCOUNTER — Encounter (HOSPITAL_COMMUNITY)
Admission: RE | Admit: 2019-02-16 | Discharge: 2019-02-16 | Disposition: A | Discharge status: Home / Self Care | Payer: Medicare Other | Source: Ambulatory Visit | Attending: Cardiology | Admitting: Cardiology

## 2019-02-16 DIAGNOSIS — I214 Non-ST elevation (NSTEMI) myocardial infarction: Secondary | ICD-10-CM

## 2019-02-16 DIAGNOSIS — Z951 Presence of aortocoronary bypass graft: Secondary | ICD-10-CM | POA: Diagnosis not present

## 2019-02-16 DIAGNOSIS — Z955 Presence of coronary angioplasty implant and graft: Secondary | ICD-10-CM | POA: Diagnosis not present

## 2019-02-18 ENCOUNTER — Ambulatory Visit (HOSPITAL_COMMUNITY): Payer: Medicare Other

## 2019-02-18 ENCOUNTER — Other Ambulatory Visit: Payer: Self-pay

## 2019-02-18 ENCOUNTER — Encounter (HOSPITAL_COMMUNITY)
Admission: RE | Admit: 2019-02-18 | Discharge: 2019-02-18 | Disposition: A | Discharge status: Home / Self Care | Payer: Medicare Other | Source: Ambulatory Visit | Attending: Cardiology | Admitting: Cardiology

## 2019-02-18 DIAGNOSIS — Z955 Presence of coronary angioplasty implant and graft: Secondary | ICD-10-CM

## 2019-02-18 DIAGNOSIS — Z951 Presence of aortocoronary bypass graft: Secondary | ICD-10-CM | POA: Diagnosis not present

## 2019-02-18 DIAGNOSIS — I214 Non-ST elevation (NSTEMI) myocardial infarction: Secondary | ICD-10-CM

## 2019-02-20 ENCOUNTER — Other Ambulatory Visit: Payer: Self-pay

## 2019-02-20 ENCOUNTER — Encounter (HOSPITAL_COMMUNITY)
Admission: RE | Admit: 2019-02-20 | Discharge: 2019-02-20 | Disposition: A | Discharge status: Home / Self Care | Payer: Medicare Other | Source: Ambulatory Visit | Attending: Cardiology | Admitting: Cardiology

## 2019-02-20 ENCOUNTER — Ambulatory Visit (HOSPITAL_COMMUNITY): Payer: Medicare Other

## 2019-02-20 DIAGNOSIS — I214 Non-ST elevation (NSTEMI) myocardial infarction: Secondary | ICD-10-CM

## 2019-02-20 DIAGNOSIS — Z951 Presence of aortocoronary bypass graft: Secondary | ICD-10-CM | POA: Diagnosis not present

## 2019-02-20 DIAGNOSIS — Z955 Presence of coronary angioplasty implant and graft: Secondary | ICD-10-CM | POA: Diagnosis not present

## 2019-02-23 ENCOUNTER — Encounter (HOSPITAL_COMMUNITY): Payer: Medicare Other

## 2019-02-23 ENCOUNTER — Ambulatory Visit (HOSPITAL_COMMUNITY): Payer: Medicare Other

## 2019-02-24 DIAGNOSIS — K219 Gastro-esophageal reflux disease without esophagitis: Secondary | ICD-10-CM | POA: Diagnosis not present

## 2019-02-24 DIAGNOSIS — E7800 Pure hypercholesterolemia, unspecified: Secondary | ICD-10-CM | POA: Diagnosis not present

## 2019-02-24 DIAGNOSIS — E119 Type 2 diabetes mellitus without complications: Secondary | ICD-10-CM | POA: Diagnosis not present

## 2019-02-24 DIAGNOSIS — Z0001 Encounter for general adult medical examination with abnormal findings: Secondary | ICD-10-CM | POA: Diagnosis not present

## 2019-02-24 DIAGNOSIS — Z125 Encounter for screening for malignant neoplasm of prostate: Secondary | ICD-10-CM | POA: Diagnosis not present

## 2019-02-24 DIAGNOSIS — I10 Essential (primary) hypertension: Secondary | ICD-10-CM | POA: Diagnosis not present

## 2019-02-24 DIAGNOSIS — Z79899 Other long term (current) drug therapy: Secondary | ICD-10-CM | POA: Diagnosis not present

## 2019-02-25 ENCOUNTER — Ambulatory Visit (HOSPITAL_COMMUNITY): Payer: Medicare Other

## 2019-02-25 ENCOUNTER — Encounter (HOSPITAL_COMMUNITY): Payer: Medicare Other

## 2019-02-27 ENCOUNTER — Encounter (HOSPITAL_COMMUNITY): Payer: Self-pay | Admitting: *Deleted

## 2019-02-27 ENCOUNTER — Ambulatory Visit (HOSPITAL_COMMUNITY): Payer: Medicare Other

## 2019-02-27 ENCOUNTER — Encounter (HOSPITAL_COMMUNITY): Payer: Medicare Other

## 2019-02-27 DIAGNOSIS — Z955 Presence of coronary angioplasty implant and graft: Secondary | ICD-10-CM

## 2019-02-27 DIAGNOSIS — I214 Non-ST elevation (NSTEMI) myocardial infarction: Secondary | ICD-10-CM

## 2019-03-02 ENCOUNTER — Ambulatory Visit (HOSPITAL_COMMUNITY): Payer: Medicare Other

## 2019-03-02 ENCOUNTER — Encounter (HOSPITAL_COMMUNITY): Payer: Medicare Other

## 2019-03-04 ENCOUNTER — Encounter (HOSPITAL_COMMUNITY): Payer: Medicare Other

## 2019-03-04 ENCOUNTER — Ambulatory Visit (HOSPITAL_COMMUNITY): Payer: Medicare Other

## 2019-03-04 ENCOUNTER — Encounter (HOSPITAL_COMMUNITY): Payer: Self-pay | Admitting: *Deleted

## 2019-03-04 DIAGNOSIS — Z955 Presence of coronary angioplasty implant and graft: Secondary | ICD-10-CM

## 2019-03-04 DIAGNOSIS — I214 Non-ST elevation (NSTEMI) myocardial infarction: Secondary | ICD-10-CM

## 2019-03-04 NOTE — Progress Notes (Signed)
Cardiac Individual Treatment Plan  Patient Details  Name: BRUCE CHURILLA MRN: [id number] Date of Birth: 10-10-1946 Referring Provider:     CARDIAC REHAB PHASE II ORIENTATION from 12/23/2018 in [place]  Referring Provider  Dr. Marlou Porch      Initial Encounter Date:    CARDIAC REHAB PHASE II ORIENTATION from 12/23/2018 in [place]  Date  12/23/18      Visit Diagnosis: 10/04/18 NSTEMI  10/06/18 DES OM  Patient's Home Medications on Admission:  Current Outpatient Medications:  .  aspirin EC 81 MG tablet, Take 1 tablet (81 mg total) by mouth daily., Disp: , Rfl:  .  BRILINTA 90 MG TABS tablet, Take 1 tablet (90 mg total) by mouth 2 (two) times daily., Disp: 180 tablet, Rfl: 3 .  cetirizine (ZYRTEC) 10 MG tablet, Take 10 mg by mouth as needed for allergies., Disp: , Rfl:  .  CONTOUR NEXT TEST test strip, USE TO TEST SUGAR ONCE A DAY, Disp: , Rfl:  .  isosorbide mononitrate (IMDUR) 60 MG 24 hr tablet, Take 1 tablet (60 mg total) by mouth daily., Disp: 90 tablet, Rfl: 3 .  lansoprazole (PREVACID) 30 MG capsule, Take 1 capsule by mouth daily., Disp: , Rfl: 5 .  lisinopril (PRINIVIL,ZESTRIL) 5 MG tablet, Take 1 tablet (5 mg total) by mouth daily., Disp: 30 tablet, Rfl: 1 .  metFORMIN (GLUCOPHAGE) 1000 MG tablet, Take 1,000 mg by mouth 2 (two) times daily with a meal., Disp: , Rfl:  .  metoprolol tartrate (LOPRESSOR) 50 MG tablet, TAKE 1.5 TABLETS (75 MG TOTAL) BY MOUTH 2 (TWO) TIMES DAILY., Disp: 270 tablet, Rfl: 2 .  nitroGLYCERIN (NITROSTAT) 0.4 MG SL tablet, Place 1 tablet (0.4 mg total) under the tongue every 5 (five) minutes as needed for chest pain., Disp: 25 tablet, Rfl: 11 .  rosuvastatin (CRESTOR) 20 MG tablet, Take 20 mg by mouth daily. , Disp: , Rfl:   Past Medical History: Past Medical History:  Diagnosis Date  . Allergic rhinitis   . CAD (coronary artery disease) 01/07/2017   a - Myoview 12/17:  EF 55,  inf-lateral ischemia; intermediate risk // b - LHC 12/05/16: oLM 50, oLAD 40, dLAD 50, RI 25, oLCx 95, mLCx 95, RCA luminal irregs // c - Echo 12/17: EF 55-60, no RWMA, Gr 1 DD // d. S/p CABG 1/18 (L-LAD, S-RI, S-LCx) // NSTEMI 10.2019 -([place], [place]) - LHC: L-LAD ok, S-LCx and S-RI occluded; PCI: DES x 2 to RI branch of LCx; Echo: EF 60-65  . 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 DJD (degenerative joint disease)    hips  . ED (erectile dysfunction)   . GERD (gastroesophageal reflux disease)   . Gynecomastia, male   . History of echocardiogram    Echo 12/17: EF 55-60, normal wall motion, grade 1 diastolic dysfunction  . History of nuclear stress test    Myoview 12/17: EF 55, inf-lateral ischemia; intermediate risk  . HLD (hyperlipidemia)    hx of ? elevated LFTs on statin Rx  . Hypertension   . Obesity   . Prostate cancer [facility])    s/p radical prostatectomy  . Spinal stenosis     Tobacco Use: Social History   Tobacco Use  Smoking Status Never Smoker  Smokeless Tobacco Current User  . Types: Chew    Labs: Recent Review [facility] for [facility] Cardiac and Pulmonary Rehab Latest  Ref Rng & Units 12/19/2016 12/19/2016 12/19/2016 12/19/2016 12/20/2016   Hemoglobin A1c 4.8 - 5.6 % - - - - -   PHART 7.350 - 7.450 7.342(L) 7.312(L) - 7.443 -   PCO2ART 32.0 - 48.0 mmHg 40.8 38.4 - 38.6 -   HCO3 20.0 - 28.0 mmol/L 22.6 19.4(L) - 26.2 -   TCO2 0 - 100 mmol/L 24 20 21 27 25    ACIDBASEDEF 0.0 - 2.0 mmol/L 3.0(H) 6.0(H) - - -   O2SAT % 96.0 99.0 - 99.0 -      Capillary Blood Glucose: Lab Results  Component Value Date   GLUCAP 106 (H) 01/05/2019   GLUCAP 113 (H) 01/02/2019   GLUCAP 108 (H) 01/02/2019   GLUCAP 113 (H) 12/31/2018   GLUCAP 90 12/31/2018     Exercise Target Goals: Exercise Program Goal: Individual exercise prescription set using results from initial 6 min walk test and THRR while considering  patient's activity barriers and  safety.   Exercise Prescription Goal: Initial exercise prescription builds to 30-45 minutes a day of aerobic activity, 2-3 days per week.  Home exercise guidelines will be given to patient during program as part of exercise prescription that the participant will acknowledge.  Activity Barriers & Risk Stratification:   6 Minute Walk:   Oxygen Initial Assessment:   Oxygen Re-Evaluation:   Oxygen Discharge (Final Oxygen Re-Evaluation):   Initial Exercise Prescription:   Perform Capillary Blood Glucose checks as needed.  Exercise Prescription Changes: Exercise Prescription Changes    Row Name 01/07/19 1457 01/19/19 1450 02/02/19 1450 02/16/19 1452 02/20/19 1457     Response to Exercise   Blood Pressure (Admit)  120/68  104/62  108/50  112/60  118/70   Blood Pressure (Exercise)  122/80  130/72  120/78  108/72  142/72   Blood Pressure (Exit)  100/70  108/70  112/62  116/62  112/62   Heart Rate (Admit)  77 bpm  89 bpm  79 bpm  91 bpm  71 bpm   Heart Rate (Exercise)  98 bpm  105 bpm  101 bpm  111 bpm  102 bpm   Heart Rate (Exit)  70 bpm  89 bpm  75 bpm  91 bpm  69 bpm   Rating of Perceived Exertion (Exercise)  12  12  12  12  12    Symptoms  none  none  none  none  none   Duration  Progress to 30 minutes of  aerobic without signs/symptoms of physical distress  Progress to 30 minutes of  aerobic without signs/symptoms of physical distress  Progress to 30 minutes of  aerobic without signs/symptoms of physical distress  Progress to 30 minutes of  aerobic without signs/symptoms of physical distress  Progress to 30 minutes of  aerobic without signs/symptoms of physical distress   Intensity  THRR unchanged  THRR unchanged  THRR unchanged  THRR unchanged  THRR unchanged     Progression   Progression  Continue to progress workloads to maintain intensity without signs/symptoms of physical distress.  Continue to progress workloads to maintain intensity without signs/symptoms of physical  distress.  Continue to progress workloads to maintain intensity without signs/symptoms of physical distress.  Continue to progress workloads to maintain intensity without signs/symptoms of physical distress.  Continue to progress workloads to maintain intensity without signs/symptoms of physical distress.   Average METs  2.5  2.7  2.6  2.9  3     Resistance Training   Training Prescription  No Relaxation  day, no weights  Yes  Yes  Yes  Yes   Weight  -  3 lbs.  4lbs  4lbs  4lbs   Reps  -  10-15  10-15  10-15  10-15   Time  -  10 Minutes  10 Minutes  10 Minutes  10 Minutes     Interval Training   Interval Training  No  No  No  No  No     Treadmill   MPH  2.5  2.6  2.6  2.6  2.6   Grade  0  0  0  1  1   Minutes  10  10  10  10  10    METs  2.91  3.07  2.99  3.35  3.35     Recumbant Bike   Level  1.5  1.5  1.5  2  2    Watts  22  -  -  -  -   Minutes  10  10  10  10  10    METs  1.8  1.9  1.9  2.1  2.2     NuStep   Level  3  3  3  4  4    SPM  85  85  85  85  85   Minutes  10  10  10  10  10    METs  2.8  3.1  3  3.2  3.4     Home Exercise Plan   Plans to continue exercise at  [facility] (comment) Patient using treadmill and recumbent bike at gym.  Community Facility (comment) Patient using treadmill and recumbent bike at gym.  Community Facility (comment) Patient using treadmill and recumbent bike at gym.  Community Facility (comment) Patient using treadmill and recumbent bike at gym.  Community Facility (comment) Patient using treadmill and recumbent bike at gym.   Frequency  Add 2 additional days to program exercise sessions.  Add 2 additional days to program exercise sessions.  Add 2 additional days to program exercise sessions.  Add 2 additional days to program exercise sessions.  Add 2 additional days to program exercise sessions.   Initial Home Exercises Provided  01/07/19  01/07/19  01/07/19  01/07/19  01/07/19      Exercise Comments: Exercise Comments    Row Name  01/05/19 1504 01/07/19 1522 01/19/19 1450 02/02/19 1506 02/16/19 1517   Exercise Comments  Reviewed METs with patient.  Reviewed home exercise guidelines, METs, and goals with patient.  Reviewed METs and goals with patient.  METs reviewed with patient.  Reviewed METs and goals with patient.      Exercise Goals and Review:   Exercise Goals Re-Evaluation : Exercise Goals Re-Evaluation    Row Name 01/05/19 1551 01/07/19 1522 01/19/19 1450 02/16/19 1517 02/25/19 1420     Exercise Goal Re-Evaluation   Exercise Goals Review  Able to understand and use rate of perceived exertion (RPE) scale;Increase Physical Activity  Able to understand and use rate of perceived exertion (RPE) scale;Increase Physical Activity;Understanding of Exercise Prescription;Able to check pulse independently;Knowledge and understanding of Target Heart Rate Range (THRR)  Able to understand and use rate of perceived exertion (RPE) scale;Increase Physical Activity;Understanding of Exercise Prescription;Able to check pulse independently;Knowledge and understanding of Target Heart Rate Range (THRR)  Able to understand and use rate of perceived exertion (RPE) scale;Increase Physical Activity;Understanding of Exercise Prescription;Able to check pulse independently;Knowledge and understanding of Target Heart Rate Range (THRR)  -   Comments  Patient able to understand and use RPE scale appropriately. Pt experienced chest tightness with exercise on the treadmill. Symptoms resolved with rest, 12 lead EKG obtained, cardiology office contacted. Pt was cleared to resume exercise, follow-up appointment with cardiology obtained.  Reviewed home exercise guidelines with patient including THRR, RPE scale, and endpoints for exercise. Pt is exercising at the gym riding recumbent bike (RB), walking on treadmill (TM), and participating in the [place] program. Pt also bowls on Monday. Pt has a FitBit to check his heart rate.  Patient continues to exercise  at gym on Tuesdays and Thursdays: TM 20 mins, bike 30 minutes without angina.  Patient feels like he's making steady progress with exercise. Patient states that he doesn't have chest pain when exercising in the mornings at the gym but still has some chest pain with exercise at cardiac rehab. Questionable whether CP is angina vs heart burn since CR is after lunch. Pt states that he's exercising at the same or higher workloads at the gym  Temporary department closure due to COVID-19.   Expected Outcomes  Patient will exercise at level below which anginal symptoms occur.  Patient will continue to exercise at the gym on Tuesdays and Thursdays: 30 minutes on RB and 20 minutes on TM, in addition to exercise at cardiac rehab to help improve cardiorespiratory fitness.  Continue current exercise routine increasing workloads as tolerated.  Progress workloads as tolerated to help increase threshold for exercise without angina.  -      Discharge Exercise Prescription (Final Exercise Prescription Changes): Exercise Prescription Changes - 02/20/19 1457      Response to Exercise   Blood Pressure (Admit)  118/70    Blood Pressure (Exercise)  142/72    Blood Pressure (Exit)  112/62    Heart Rate (Admit)  71 bpm    Heart Rate (Exercise)  102 bpm    Heart Rate (Exit)  69 bpm    Rating of Perceived Exertion (Exercise)  12    Symptoms  none    Duration  Progress to 30 minutes of  aerobic without signs/symptoms of physical distress    Intensity  THRR unchanged      Progression   Progression  Continue to progress workloads to maintain intensity without signs/symptoms of physical distress.    Average METs  3      Resistance Training   Training Prescription  Yes    Weight  4lbs    Reps  10-15    Time  10 Minutes      Interval Training   Interval Training  No      Treadmill   MPH  2.6    Grade  1    Minutes  10    METs  3.35      Recumbant Bike   Level  2    Minutes  10    METs  2.2      NuStep    Level  4    SPM  85    Minutes  10    METs  3.4      Home Exercise Plan   Plans to continue exercise at  [facility] (comment)   Patient using treadmill and recumbent bike at gym.   Frequency  Add 2 additional days to program exercise sessions.    Initial Home Exercises Provided  01/07/19       Nutrition:  Target Goals: Understanding of nutrition guidelines, daily intake of sodium 1500mg , cholesterol 200mg , calories 30%  from fat and 7% or less from saturated fats, daily to have 5 or more servings of fruits and vegetables.  Biometrics:    Nutrition Therapy Plan and Nutrition Goals:   Nutrition Assessments:   Nutrition Goals Re-Evaluation:   Nutrition Goals Re-Evaluation:   Nutrition Goals Discharge (Final Nutrition Goals Re-Evaluation):   Psychosocial: Target Goals: Acknowledge presence or absence of significant depression and/or stress, maximize coping skills, provide positive support system. Participant is able to verbalize types and ability to use techniques and skills needed for reducing stress and depression.  Initial Review & Psychosocial Screening:   Quality of Life Scores:  Scores of 19 and below usually indicate a poorer quality of life in these areas.  A difference of  2-3 points is a clinically meaningful difference.  A difference of 2-3 points in the total score of the Quality of Life Index has been associated with significant improvement in overall quality of life, self-image, physical symptoms, and general health in studies assessing change in quality of life.  PHQ-9: Recent Review Flowsheet Data    Depression screen [facility] 2/9 12/29/2018 05/01/2017 02/11/2017   Decreased Interest 0 0 0   Down, Depressed, Hopeless 0 0 0   PHQ - 2 Score 0 0 0     Interpretation of Total Score  Total Score Depression Severity:  1-4 = Minimal depression, 5-9 = Mild depression, 10-14 = Moderate depression, 15-19 = Moderately severe depression, 20-27 = Severe  depression   Psychosocial Evaluation and Intervention:   Psychosocial Re-Evaluation: Psychosocial Re-Evaluation    [place] Name 01/15/19 1512 02/05/19 1738 02/27/19 1411         Psychosocial Re-Evaluation   Current issues with  None Identified  None Identified  None Identified     Comments  -  no psychosocial needs identifed, no interventions necessary.   Unable to assess as exercise is currently on hold     Expected Outcomes  -  pt will deomonstrate positive outlook with good coping skills.    -     Interventions  Encouraged to attend Cardiac Rehabilitation for the exercise  Encouraged to attend Cardiac Rehabilitation for the exercise  -     Continue Psychosocial Services   No Follow up required  No Follow up required  -        Psychosocial Discharge (Final Psychosocial Re-Evaluation): Psychosocial Re-Evaluation - 02/27/19 1411      Psychosocial Re-Evaluation   Current issues with  None Identified    Comments  Unable to assess as exercise is currently on hold       Vocational Rehabilitation: Provide vocational rehab assistance to qualifying candidates.   Vocational Rehab Evaluation & Intervention:   Education: Education Goals: Education classes will be provided on a weekly basis, covering required topics. Participant will state understanding/return demonstration of topics presented.  Learning Barriers/Preferences:   Education Topics: Count Your Pulse:  -Group instruction provided by verbal instruction, demonstration, patient participation and written materials to support subject.  Instructors address importance of being able to find your pulse and how to count your pulse when at home without a heart monitor.  Patients get hands on experience counting their pulse with staff help and individually.   CARDIAC REHAB PHASE II EXERCISE from 02/20/2019 in [place]  Date  01/02/19  Instruction Review Code  2- Demonstrated Understanding      Heart  Attack, Angina, and Risk Factor Modification:  -Group instruction provided by verbal instruction, video, and written materials  to support subject.  Instructors address signs and symptoms of angina and heart attacks.    Also discuss risk factors for heart disease and how to make changes to improve heart health risk factors.   CARDIAC REHAB PHASE II EXERCISE from 05/01/2017 in [place]  Date  05/01/17  Instruction Review Code (Retired)  2- meets goals/outcomes      Functional Fitness:  -Group instruction provided by verbal instruction, demonstration, patient participation, and written materials to support subject.  Instructors address safety measures for doing things around the house.  Discuss how to get up and down off the floor, how to pick things up properly, how to safely get out of a chair without assistance, and balance training.   CARDIAC REHAB PHASE II EXERCISE from 02/20/2019 in [place]  Date  01/30/19  Educator  EP  Instruction Review Code  2- Demonstrated Understanding      Meditation and Mindfulness:  -Group instruction provided by verbal instruction, patient participation, and written materials to support subject.  Instructor addresses importance of mindfulness and meditation practice to help reduce stress and improve awareness.  Instructor also leads participants through a meditation exercise.    CARDIAC REHAB PHASE II EXERCISE from 02/20/2019 in [place]  Date  02/11/19  Educator  Mable Fill  Instruction Review Code  2- Demonstrated Understanding      Stretching for Flexibility and Mobility:  -Group instruction provided by verbal instruction, patient participation, and written materials to support subject.  Instructors lead participants through series of stretches that are designed to increase flexibility thus improving mobility.  These stretches are additional exercise for major  muscle groups that are typically performed during regular warm up and cool down.   CARDIAC REHAB PHASE II EXERCISE from 05/01/2017 in [place]  Date  04/05/17  Instruction Review Code (Retired)  2- meets goals/outcomes      Hands Only CPR:  -Group verbal, video, and participation provides a basic overview of AHA guidelines for community CPR. Role-play of emergencies allow participants the opportunity to practice calling for help and chest compression technique with discussion of AED use.   Hypertension: -Group verbal and written instruction that provides a basic overview of hypertension including the most recent diagnostic guidelines, risk factor reduction with self-care instructions and medication management.   CARDIAC REHAB PHASE II EXERCISE from 02/20/2019 in [place]  Date  02/20/19  Educator  RN  Instruction Review Code  2- Demonstrated Understanding       Nutrition I class: Heart Healthy Eating:  -Group instruction provided by PowerPoint slides, verbal discussion, and written materials to support subject matter. The instructor gives an explanation and review of the Therapeutic Lifestyle Changes diet recommendations, which includes a discussion on lipid goals, dietary fat, sodium, fiber, plant stanol/sterol esters, sugar, and the components of a well-balanced, healthy diet.   CARDIAC REHAB PHASE II EXERCISE from 05/01/2017 in [place]  Date  02/12/17  Educator  RD  Instruction Review Code (Retired)  2- meets goals/outcomes      Nutrition II class: Lifestyle Skills:  -Group instruction provided by [employer], verbal discussion, and written materials to support subject matter. The instructor gives an explanation and review of label reading, grocery shopping for heart health, heart healthy recipe modifications, and ways to make healthier choices when eating out.   CARDIAC REHAB  PHASE II  EXERCISE from 05/01/2017 in [place]  Date  02/19/17  Educator  RD  Instruction Review Code (Retired)  2- meets goals/outcomes      Diabetes Question & Answer:  -Group instruction provided by [employer], verbal discussion, and written materials to support subject matter. The instructor gives an explanation and review of diabetes co-morbidities, pre- and post-prandial blood glucose goals, pre-exercise blood glucose goals, signs, symptoms, and treatment of hypoglycemia and hyperglycemia, and foot care basics.   CARDIAC REHAB PHASE II EXERCISE from 05/01/2017 in [place]  Date  04/19/17  Educator  RD  Instruction Review Code (Retired)  2- meets goals/outcomes      Diabetes Blitz:  -Group instruction provided by [employer], verbal discussion, and written materials to support subject matter. The instructor gives an explanation and review of the physiology behind type 1 and type 2 diabetes, diabetes medications and rational behind using different medications, pre- and post-prandial blood glucose recommendations and Hemoglobin A1c goals, diabetes diet, and exercise including blood glucose guidelines for exercising safely.    CARDIAC REHAB PHASE II EXERCISE from 05/01/2017 in [place]  Date  03/19/17  Educator  RD  Instruction Review Code (Retired)  2- meets goals/outcomes      Portion Distortion:  -Group instruction provided by [employer], verbal discussion, written materials, and food models to support subject matter. The instructor gives an explanation of serving size versus portion size, changes in portions sizes over the last 20 years, and what consists of a serving from each food group.   CARDIAC REHAB PHASE II EXERCISE from 05/01/2017 in [place]  Date  04/04/17  Educator  RD  Instruction Review Code (Retired)  2- meets  goals/outcomes      Stress Management:  -Group instruction provided by verbal instruction, video, and written materials to support subject matter.  Instructors review role of stress in heart disease and how to cope with stress positively.     CARDIAC REHAB PHASE II EXERCISE from 05/01/2017 in [place]  Date  04/10/17  Instruction Review Code (Retired)  2- meets goals/outcomes      Exercising on Your Own:  -Group instruction provided by verbal instruction, power point, and written materials to support subject.  Instructors discuss benefits of exercise, components of exercise, frequency and intensity of exercise, and end points for exercise.  Also discuss use of nitroglycerin and activating EMS.  Review options of places to exercise outside of rehab.  Review guidelines for sex with heart disease.   CARDIAC REHAB PHASE II EXERCISE from 02/20/2019 in [place]  Date  02/04/19  Instruction Review Code  2- Demonstrated Understanding      Cardiac Drugs I:  -Group instruction provided by verbal instruction and written materials to support subject.  Instructor reviews cardiac drug classes: antiplatelets, anticoagulants, beta blockers, and statins.  Instructor discusses reasons, side effects, and lifestyle considerations for each drug class.   CARDIAC REHAB PHASE II EXERCISE from 05/01/2017 in [place]  Date  03/20/17  Educator  Kennyth Lose  Instruction Review Code (Retired)  2- meets goals/outcomes      Cardiac Drugs II:  -Group instruction provided by verbal instruction and written materials to support subject.  Instructor reviews cardiac drug classes: angiotensin converting enzyme inhibitors (ACE-I), angiotensin II receptor blockers (ARBs), nitrates, and calcium channel blockers.  Instructor discusses  reasons, side effects, and lifestyle considerations for each drug class.   CARDIAC REHAB PHASE II  EXERCISE from 05/01/2017 in [place]  Date  04/17/17  Educator  Kennyth Lose  Instruction Review Code (Retired)  2- Lawyer and Physiology of the Circulatory System:  Group verbal and written instruction and models provide basic cardiac anatomy and physiology, with the coronary electrical and arterial systems. Review of: AMI, Angina, Valve disease, Heart Failure, Peripheral Artery Disease, Cardiac Arrhythmia, Pacemakers, and the ICD.   CARDIAC REHAB PHASE II EXERCISE from 02/20/2019 in [place]  Date  01/14/19  Instruction Review Code  2- Demonstrated Understanding      Other Education:  -Group or individual verbal, written, or video instructions that support the educational goals of the cardiac rehab program.   Holiday Eating Survival Tips:  -Group instruction provided by PowerPoint slides, verbal discussion, and written materials to support subject matter. The instructor gives patients tips, tricks, and techniques to help them not only survive but enjoy the holidays despite the onslaught of food that accompanies the holidays.   Knowledge Questionnaire Score:   Core Components/Risk Factors/Patient Goals at Admission:   Core Components/Risk Factors/Patient Goals Review:  Goals and Risk Factor Review    Row Name 01/15/19 1512 02/05/19 1739 02/27/19 1412         Core Components/Risk Factors/Patient Goals Review   Personal Goals Review  Weight Management/Obesity;Lipids;Hypertension;Diabetes;Stress  Weight Management/Obesity;Lipids;Hypertension;Diabetes;Stress  Weight Management/Obesity;Lipids;Hypertension;Diabetes;Stress     Review  Joneen Boers is doing well with exercise at cardiac rehab. [name]'s vital signs and CBG's have been stable. Joneen Boers has not had any further complaints of chest pain/pressure  Joneen Boers is doing well with exercise at cardiac rehab. [name]'s vital signs and CBG's have been stable.  Will continue to monitor for symptoms  Exercise is currently on hold as department is closed per reccomended guidelines from the [employer] to prevent the spread of COVID-19     Expected Outcomes  pt will continue exercise, nutrition and medication regimen for CAD management.    pt will continue exercise, nutrition and medication regimen for CAD management.    pt will continue exercise, nutrition and medication regimen for CAD management when exercise resumes        Core Components/Risk Factors/Patient Goals at Discharge (Final Review):  Goals and Risk Factor Review - 02/27/19 1412      Core Components/Risk Factors/Patient Goals Review   Personal Goals Review  Weight Management/Obesity;Lipids;Hypertension;Diabetes;Stress    Review  Exercise is currently on hold as department is closed per reccomended guidelines from the [employer] to prevent the spread of COVID-19    Expected Outcomes  pt will continue exercise, nutrition and medication regimen for CAD management when exercise resumes       ITP Comments: ITP Comments    Row Name 01/15/19 1511 02/05/19 1738 02/27/19 1411       ITP Comments  30 Day ITP Review. Joneen Boers is with good attendance and partcipation in phase 2 cardiac rehab  30 Day ITP Review. Joneen Boers is with good attendance and partcipation in phase 2 cardiac rehab  30 Day ITP Review. Exercise is currently on hold as department is closed per reccomended guidelines from the [employer] to prevent the spread of COVID-19        Comments: See ITP comments.[name], RN,BSN 03/04/2019 9:55 AM

## 2019-03-06 ENCOUNTER — Ambulatory Visit (HOSPITAL_COMMUNITY): Payer: Medicare Other

## 2019-03-06 ENCOUNTER — Encounter (HOSPITAL_COMMUNITY): Payer: Medicare Other

## 2019-03-09 ENCOUNTER — Encounter (HOSPITAL_COMMUNITY): Payer: Medicare Other

## 2019-03-09 ENCOUNTER — Ambulatory Visit (HOSPITAL_COMMUNITY): Payer: Medicare Other

## 2019-03-11 ENCOUNTER — Encounter (HOSPITAL_COMMUNITY): Payer: Medicare Other

## 2019-03-11 ENCOUNTER — Ambulatory Visit (HOSPITAL_COMMUNITY): Payer: Medicare Other

## 2019-03-13 ENCOUNTER — Encounter (HOSPITAL_COMMUNITY): Payer: Medicare Other

## 2019-03-13 ENCOUNTER — Ambulatory Visit (HOSPITAL_COMMUNITY): Payer: Medicare Other

## 2019-03-16 ENCOUNTER — Encounter (HOSPITAL_COMMUNITY): Payer: Medicare Other

## 2019-03-16 ENCOUNTER — Ambulatory Visit (HOSPITAL_COMMUNITY): Payer: Medicare Other

## 2019-03-18 ENCOUNTER — Encounter (HOSPITAL_COMMUNITY): Payer: Medicare Other

## 2019-03-18 ENCOUNTER — Ambulatory Visit (HOSPITAL_COMMUNITY): Payer: Medicare Other

## 2019-03-20 ENCOUNTER — Telehealth (HOSPITAL_COMMUNITY): Payer: Self-pay | Admitting: *Deleted

## 2019-03-20 ENCOUNTER — Ambulatory Visit (HOSPITAL_COMMUNITY): Payer: Medicare Other

## 2019-03-20 ENCOUNTER — Encounter (HOSPITAL_COMMUNITY): Payer: Medicare Other

## 2019-03-20 NOTE — Telephone Encounter (Signed)
Called to notify patient that the cardiac and pulmonary rehabilitation department remains closed at this time due to COVID-19 restrictions. Pt verbalized understanding. Pt is walking some at home, not as much as before. Pt feels he's better able to walk the neighborhood since exercising in the program and plans to continue walking to maintain stamina.  Sol Passer, MS, ACSM CEP 03/20/2019 1351

## 2019-03-23 ENCOUNTER — Encounter (HOSPITAL_COMMUNITY): Payer: Medicare Other

## 2019-03-23 ENCOUNTER — Ambulatory Visit (HOSPITAL_COMMUNITY): Payer: Medicare Other

## 2019-03-25 ENCOUNTER — Encounter (HOSPITAL_COMMUNITY): Payer: Medicare Other

## 2019-03-25 ENCOUNTER — Ambulatory Visit (HOSPITAL_COMMUNITY): Payer: Medicare Other

## 2019-03-27 ENCOUNTER — Encounter (HOSPITAL_COMMUNITY): Payer: Medicare Other

## 2019-03-27 ENCOUNTER — Ambulatory Visit (HOSPITAL_COMMUNITY): Payer: Medicare Other

## 2019-03-30 ENCOUNTER — Ambulatory Visit (HOSPITAL_COMMUNITY): Payer: Medicare Other

## 2019-03-30 ENCOUNTER — Encounter (HOSPITAL_COMMUNITY): Payer: Medicare Other

## 2019-04-01 ENCOUNTER — Encounter (HOSPITAL_COMMUNITY): Payer: Medicare Other

## 2019-04-01 ENCOUNTER — Ambulatory Visit (HOSPITAL_COMMUNITY): Payer: Medicare Other

## 2019-05-06 ENCOUNTER — Telehealth (HOSPITAL_COMMUNITY): Payer: Self-pay | Admitting: *Deleted

## 2019-05-08 ENCOUNTER — Encounter (HOSPITAL_COMMUNITY): Payer: Self-pay

## 2019-05-08 NOTE — Progress Notes (Signed)
Transitioning to virtual cardiac rehab      Dr.  Marlou Porch   As you are aware our department remains closed to patients due to Covid-19.  We are excited to be able to offer an alternative to traditional onsite Cardiac Rehab while your patient continues to follow Re-Open guidelines.  This is a notification that your patient has been contacted and is very interested in participating in Virtual Cardiac Rehab.  Thank you for your continued support in helping [place] meet the health care needs of our patients.  Tedra Senegal. Support Rep II  Cardiac Rehab staff

## 2019-05-11 ENCOUNTER — Telehealth (HOSPITAL_COMMUNITY): Payer: Self-pay

## 2019-05-11 NOTE — Telephone Encounter (Signed)
Attempted to contact pt to schedule a telephone visit for our Virtual Cardiac Rehab, [email or web address] °

## 2019-05-13 ENCOUNTER — Telehealth (HOSPITAL_COMMUNITY): Payer: Self-pay | Admitting: *Deleted

## 2019-05-13 NOTE — Telephone Encounter (Signed)
         Confirm Consent - In the setting of the current Covid19 crisis, you are scheduled for a phone visit with your Cardiac or Pulmonary team member.  Just as we do with many in-gym visits, in order for you to participate in this visit, we must obtain consent.  If you'd like, I can send this to your mychart (if signed up) or email for you to review.  Otherwise, I can obtain your verbal consent now.  By agreeing to a telephone visit, we'd like you to understand that the technology does not allow for your Cardiac or Pulmonary Rehab team member to perform a physical assessment, and thus may limit their ability to fully assess your ability to perform exercise programs. If your provider identifies any concerns that need to be evaluated in person, we will make arrangements to do so.  Finally, though the technology is pretty good, we cannot assure that it will always work on either your or our end and we cannot ensure that we have a secure connection.  Cardiac and Pulmonary Rehab Telehealth visits and "At Home" cardiac and pulmonary rehab are provided at no cost to you.               Are you willing to proceed?" STAFF: Did the patient verbally acknowledge consent to telehealth visit? Document YES/NO here: Yes      Maurice Small RN, BSN Cardiac and Pulmonary Rehab Nurse Navigator     Cardiac and Pulmonary Rehab Staff   05/13/2019 @ [phone]

## 2019-05-13 NOTE — Telephone Encounter (Signed)
Pt returned call and would like to transition to virtual cardiac rehab for the remainder of his participation. Pt scheduled for Thursday 05/14/19 at 10:40.  Please see consent. Cherre Huger, BSN Cardiac and Training and development officer

## 2019-05-14 ENCOUNTER — Encounter (HOSPITAL_COMMUNITY)
Admission: RE | Admit: 2019-05-14 | Discharge: 2019-05-14 | Disposition: A | Discharge status: Home / Self Care | Payer: Medicare Other | Source: Ambulatory Visit | Attending: Cardiology | Admitting: Cardiology

## 2019-05-14 ENCOUNTER — Telehealth (HOSPITAL_COMMUNITY): Payer: Self-pay

## 2019-05-14 DIAGNOSIS — I214 Non-ST elevation (NSTEMI) myocardial infarction: Secondary | ICD-10-CM | POA: Insufficient documentation

## 2019-05-14 DIAGNOSIS — Z955 Presence of coronary angioplasty implant and graft: Secondary | ICD-10-CM | POA: Insufficient documentation

## 2019-05-14 DIAGNOSIS — Z951 Presence of aortocoronary bypass graft: Secondary | ICD-10-CM | POA: Insufficient documentation

## 2019-05-14 NOTE — Telephone Encounter (Signed)
Called and spoke to pt regarding Virtual Cardiac Rehab.  Pt  was able to download the [employer] app on their smart device with no issues. Pt set up their account and received the following welcome message -"Welcome to the [facility] and Pulmonary Rehabilitation program. We hope that you will find the exercise program beneficial in your recovery process. Our staff is available to assist with any questions/concerns about your exercise routine. Best wishes". Brief orientation provided to with the advisement to watch the "Intro to Rehab" series located under the Resource tab. Pt verbalized understanding. Will continue to follow and monitor pt progress with feedback as needed.

## 2019-05-14 NOTE — Telephone Encounter (Signed)
Phone call to pt regarding [employer] App. Set up was successful. Encouraged pt to call or message via app if he had questions.

## 2019-06-04 ENCOUNTER — Telehealth (HOSPITAL_COMMUNITY): Payer: Self-pay | Admitting: *Deleted

## 2019-06-08 ENCOUNTER — Telehealth (HOSPITAL_COMMUNITY): Payer: Self-pay | Admitting: *Deleted

## 2019-06-08 NOTE — Telephone Encounter (Signed)
-----   Message from Jerline Pain, MD sent at 06/05/2019  3:08 PM EDT ----- Regarding: RE: cardiac rehab Yes. OK to proceed Candee Furbish, MD ----- Message ----- From: Magda Kiel, RN Sent: 06/05/2019  10:28 AM EDT To: Jerline Pain, MD Subject: cardiac rehab                                  Good morning Dr Marlou Porch,  Mr Kolbe is interested in resuming exercise at cardiac rehab as our program is reopening next week. Safety measures, reduced capacity and social distancing will be in place. Are you okay with Mr Hoback coming back to Cardiac rehab? Mr Macgowan has completed 25 sessions last session being 02/20/19.  Mr  Pizzi is currently using the better heart virtual cardiac rehab and enjoying using the APP.   Thanks for your input!  Sincerely,  [name] RN Cardiac Rehab

## 2019-06-09 ENCOUNTER — Telehealth (HOSPITAL_COMMUNITY): Payer: Self-pay

## 2019-06-09 NOTE — Telephone Encounter (Signed)
Called and spoke with pt in regards to rescheduling for CR, pt will come in 06/15/2019 and attend the 830 class.

## 2019-06-11 DIAGNOSIS — E119 Type 2 diabetes mellitus without complications: Secondary | ICD-10-CM | POA: Diagnosis not present

## 2019-06-11 DIAGNOSIS — H2513 Age-related nuclear cataract, bilateral: Secondary | ICD-10-CM | POA: Diagnosis not present

## 2019-06-11 DIAGNOSIS — H25013 Cortical age-related cataract, bilateral: Secondary | ICD-10-CM | POA: Diagnosis not present

## 2019-06-15 ENCOUNTER — Encounter (HOSPITAL_COMMUNITY): Payer: Medicare Other

## 2019-06-15 ENCOUNTER — Encounter (HOSPITAL_COMMUNITY)
Admission: RE | Admit: 2019-06-15 | Discharge: 2019-06-15 | Disposition: A | Discharge status: Home / Self Care | Payer: Medicare Other | Source: Ambulatory Visit | Attending: Cardiology | Admitting: Cardiology

## 2019-06-15 ENCOUNTER — Other Ambulatory Visit: Payer: Self-pay

## 2019-06-15 DIAGNOSIS — Z951 Presence of aortocoronary bypass graft: Secondary | ICD-10-CM | POA: Diagnosis not present

## 2019-06-15 DIAGNOSIS — Z955 Presence of coronary angioplasty implant and graft: Secondary | ICD-10-CM | POA: Diagnosis not present

## 2019-06-15 DIAGNOSIS — I214 Non-ST elevation (NSTEMI) myocardial infarction: Secondary | ICD-10-CM | POA: Diagnosis not present

## 2019-06-15 NOTE — Progress Notes (Signed)
Daily Session Note  Patient Details  Name: Andrew Williamson MRN: [id number] Date of Birth: 10/04/1946 Referring Provider:     CARDIAC REHAB PHASE II ORIENTATION from 12/23/2018 in [facility]  Referring Provider  Dr. Marlou Porch      Encounter Date: 06/15/2019  Check In: Session Check In - 06/15/19 0840      Check-In   Supervising physician immediately available to respond to emergencies  [facility] immediately available    Physician(s)  Dr. Berle Mull    Location  [facility]    Staff Present  Deitra Mayo, BS, ACSM CEP, Exercise Physiologist;Tyara Carol Ada, MS,ACSM CEP, Exercise Physiologist;Sherell Christoffel Karle Starch, RN, BSN    Virtual Visit  No    Medication changes reported      No    Fall or balance concerns reported     No    Tobacco Cessation  No Change    Warm-up and Cool-down  Performed on first and last piece of equipment    Resistance Training Performed  Yes    VAD Patient?  No    PAD/SET Patient?  No      Pain Assessment   Currently in Pain?  No/denies    Multiple Pain Sites  No       Capillary Blood Glucose: No results found for this or any previous visit (from the past 24 hour(s)).    Social History   Tobacco Use  Smoking Status Never Smoker  Smokeless Tobacco Current User  . Types: Chew    Goals Met:  Exercise tolerated well  Goals Unmet:  Not Applicable  Comments: Patient returned to cardiac rehab after department closure due to COVID 19. Social distancing and safety measures are in place. Patient tolerated his first day of exercise without difficulty. VSS. Telemetry: NSR.   Dr. Fransico Him is Medical Director for Cardiac Rehab at [facility].

## 2019-06-17 ENCOUNTER — Other Ambulatory Visit: Payer: Self-pay

## 2019-06-17 ENCOUNTER — Encounter (HOSPITAL_COMMUNITY): Payer: Medicare Other

## 2019-06-17 ENCOUNTER — Other Ambulatory Visit: Payer: Self-pay | Admitting: Physician Assistant

## 2019-06-17 ENCOUNTER — Encounter (HOSPITAL_COMMUNITY)
Admission: RE | Admit: 2019-06-17 | Discharge: 2019-06-17 | Disposition: A | Discharge status: Home / Self Care | Payer: Medicare Other | Source: Ambulatory Visit | Attending: Cardiology | Admitting: Cardiology

## 2019-06-17 DIAGNOSIS — I214 Non-ST elevation (NSTEMI) myocardial infarction: Secondary | ICD-10-CM

## 2019-06-17 DIAGNOSIS — Z955 Presence of coronary angioplasty implant and graft: Secondary | ICD-10-CM

## 2019-06-17 DIAGNOSIS — Z951 Presence of aortocoronary bypass graft: Secondary | ICD-10-CM | POA: Diagnosis not present

## 2019-06-19 ENCOUNTER — Encounter (HOSPITAL_COMMUNITY)
Admission: RE | Admit: 2019-06-19 | Discharge: 2019-06-19 | Disposition: A | Discharge status: Home / Self Care | Payer: Medicare Other | Source: Ambulatory Visit | Attending: Cardiology | Admitting: Cardiology

## 2019-06-19 ENCOUNTER — Encounter (HOSPITAL_COMMUNITY): Payer: Medicare Other

## 2019-06-19 ENCOUNTER — Other Ambulatory Visit: Payer: Self-pay

## 2019-06-19 DIAGNOSIS — Z955 Presence of coronary angioplasty implant and graft: Secondary | ICD-10-CM | POA: Diagnosis not present

## 2019-06-19 DIAGNOSIS — I214 Non-ST elevation (NSTEMI) myocardial infarction: Secondary | ICD-10-CM

## 2019-06-19 DIAGNOSIS — Z951 Presence of aortocoronary bypass graft: Secondary | ICD-10-CM | POA: Diagnosis not present

## 2019-06-22 ENCOUNTER — Encounter (HOSPITAL_COMMUNITY): Payer: Medicare Other

## 2019-06-22 ENCOUNTER — Other Ambulatory Visit: Payer: Self-pay

## 2019-06-22 ENCOUNTER — Encounter (HOSPITAL_COMMUNITY)
Admission: RE | Admit: 2019-06-22 | Discharge: 2019-06-22 | Disposition: A | Discharge status: Home / Self Care | Payer: Medicare Other | Source: Ambulatory Visit | Attending: Cardiology | Admitting: Cardiology

## 2019-06-22 DIAGNOSIS — Z955 Presence of coronary angioplasty implant and graft: Secondary | ICD-10-CM | POA: Diagnosis not present

## 2019-06-22 DIAGNOSIS — I214 Non-ST elevation (NSTEMI) myocardial infarction: Secondary | ICD-10-CM

## 2019-06-22 DIAGNOSIS — Z951 Presence of aortocoronary bypass graft: Secondary | ICD-10-CM | POA: Diagnosis not present

## 2019-06-24 ENCOUNTER — Encounter (HOSPITAL_COMMUNITY)
Admission: RE | Admit: 2019-06-24 | Discharge: 2019-06-24 | Disposition: A | Discharge status: Home / Self Care | Payer: Medicare Other | Source: Ambulatory Visit | Attending: Cardiology | Admitting: Cardiology

## 2019-06-24 ENCOUNTER — Encounter (HOSPITAL_COMMUNITY): Payer: Medicare Other

## 2019-06-24 ENCOUNTER — Other Ambulatory Visit: Payer: Self-pay

## 2019-06-24 DIAGNOSIS — I214 Non-ST elevation (NSTEMI) myocardial infarction: Secondary | ICD-10-CM | POA: Diagnosis not present

## 2019-06-24 DIAGNOSIS — Z955 Presence of coronary angioplasty implant and graft: Secondary | ICD-10-CM | POA: Diagnosis not present

## 2019-06-24 DIAGNOSIS — Z951 Presence of aortocoronary bypass graft: Secondary | ICD-10-CM | POA: Diagnosis not present

## 2019-06-26 ENCOUNTER — Other Ambulatory Visit: Payer: Self-pay

## 2019-06-26 ENCOUNTER — Encounter (HOSPITAL_COMMUNITY): Payer: Medicare Other

## 2019-06-26 ENCOUNTER — Encounter (HOSPITAL_COMMUNITY)
Admission: RE | Admit: 2019-06-26 | Discharge: 2019-06-26 | Disposition: A | Discharge status: Home / Self Care | Payer: Medicare Other | Source: Ambulatory Visit | Attending: Cardiology | Admitting: Cardiology

## 2019-06-26 DIAGNOSIS — Z951 Presence of aortocoronary bypass graft: Secondary | ICD-10-CM | POA: Diagnosis not present

## 2019-06-26 DIAGNOSIS — I214 Non-ST elevation (NSTEMI) myocardial infarction: Secondary | ICD-10-CM | POA: Diagnosis not present

## 2019-06-26 DIAGNOSIS — Z955 Presence of coronary angioplasty implant and graft: Secondary | ICD-10-CM

## 2019-06-29 ENCOUNTER — Encounter (HOSPITAL_COMMUNITY): Payer: Medicare Other

## 2019-06-29 ENCOUNTER — Other Ambulatory Visit: Payer: Self-pay

## 2019-06-29 ENCOUNTER — Encounter (HOSPITAL_COMMUNITY)
Admission: RE | Admit: 2019-06-29 | Discharge: 2019-06-29 | Disposition: A | Discharge status: Home / Self Care | Payer: Medicare Other | Source: Ambulatory Visit | Attending: Cardiology | Admitting: Cardiology

## 2019-06-29 DIAGNOSIS — Z951 Presence of aortocoronary bypass graft: Secondary | ICD-10-CM | POA: Diagnosis not present

## 2019-06-29 DIAGNOSIS — I214 Non-ST elevation (NSTEMI) myocardial infarction: Secondary | ICD-10-CM

## 2019-06-29 DIAGNOSIS — Z955 Presence of coronary angioplasty implant and graft: Secondary | ICD-10-CM

## 2019-07-01 ENCOUNTER — Encounter (HOSPITAL_COMMUNITY): Payer: Medicare Other

## 2019-07-01 ENCOUNTER — Encounter (HOSPITAL_COMMUNITY)
Admission: RE | Admit: 2019-07-01 | Discharge: 2019-07-01 | Disposition: A | Discharge status: Home / Self Care | Payer: Medicare Other | Source: Ambulatory Visit | Attending: Cardiology | Admitting: Cardiology

## 2019-07-01 ENCOUNTER — Other Ambulatory Visit: Payer: Self-pay

## 2019-07-01 DIAGNOSIS — I214 Non-ST elevation (NSTEMI) myocardial infarction: Secondary | ICD-10-CM

## 2019-07-01 DIAGNOSIS — Z955 Presence of coronary angioplasty implant and graft: Secondary | ICD-10-CM

## 2019-07-01 DIAGNOSIS — Z951 Presence of aortocoronary bypass graft: Secondary | ICD-10-CM | POA: Diagnosis not present

## 2019-07-02 NOTE — Progress Notes (Signed)
Cardiac Individual Treatment Plan  Patient Details  Name: Andrew Williamson MRN: [id number] Date of Birth: Apr 01, 1946 Referring Provider:     CARDIAC REHAB PHASE II ORIENTATION from 12/23/2018 in [place]  Referring Provider  Dr. Marlou Porch      Initial Encounter Date:    CARDIAC REHAB PHASE II ORIENTATION from 12/23/2018 in [place]  Date  12/23/18      Visit Diagnosis: 10/04/18 NSTEMI  10/06/18 DES OM  Patient's Home Medications on Admission:  Current Outpatient Medications:  .  aspirin EC 81 MG tablet, Take 1 tablet (81 mg total) by mouth daily., Disp: , Rfl:  .  BRILINTA 90 MG TABS tablet, Take 1 tablet (90 mg total) by mouth 2 (two) times daily., Disp: 180 tablet, Rfl: 3 .  cetirizine (ZYRTEC) 10 MG tablet, Take 10 mg by mouth as needed for allergies., Disp: , Rfl:  .  CONTOUR NEXT TEST test strip, USE TO TEST SUGAR ONCE A DAY, Disp: , Rfl:  .  isosorbide mononitrate (IMDUR) 60 MG 24 hr tablet, Take 1 tablet (60 mg total) by mouth daily., Disp: 90 tablet, Rfl: 3 .  lansoprazole (PREVACID) 30 MG capsule, Take 1 capsule by mouth daily., Disp: , Rfl: 5 .  lisinopril (PRINIVIL,ZESTRIL) 5 MG tablet, Take 1 tablet (5 mg total) by mouth daily., Disp: 30 tablet, Rfl: 1 .  metFORMIN (GLUCOPHAGE) 1000 MG tablet, Take 1,000 mg by mouth 2 (two) times daily with a meal., Disp: , Rfl:  .  metoprolol tartrate (LOPRESSOR) 50 MG tablet, TAKE 1.5 TABLETS (75 MG TOTAL) BY MOUTH 2 (TWO) TIMES DAILY., Disp: 270 tablet, Rfl: 1 .  nitroGLYCERIN (NITROSTAT) 0.4 MG SL tablet, Place 1 tablet (0.4 mg total) under the tongue every 5 (five) minutes as needed for chest pain., Disp: 25 tablet, Rfl: 11 .  rosuvastatin (CRESTOR) 20 MG tablet, Take 20 mg by mouth daily. , Disp: , Rfl:   Past Medical History: Past Medical History:  Diagnosis Date  . Allergic rhinitis   . CAD (coronary artery disease) 01/07/2017   a - Myoview 12/17:  EF 55,  inf-lateral ischemia; intermediate risk // b - LHC 12/05/16: oLM 50, oLAD 40, dLAD 50, RI 25, oLCx 95, mLCx 95, RCA luminal irregs // c - Echo 12/17: EF 55-60, no RWMA, Gr 1 DD // d. S/p CABG 1/18 (L-LAD, S-RI, S-LCx) // NSTEMI 10.2019 -([place], [place]) - LHC: L-LAD ok, S-LCx and S-RI occluded; PCI: DES x 2 to RI branch of LCx; Echo: EF 60-65  . 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 DJD (degenerative joint disease)    hips  . ED (erectile dysfunction)   . GERD (gastroesophageal reflux disease)   . Gynecomastia, male   . History of echocardiogram    Echo 12/17: EF 55-60, normal wall motion, grade 1 diastolic dysfunction  . History of nuclear stress test    Myoview 12/17: EF 55, inf-lateral ischemia; intermediate risk  . HLD (hyperlipidemia)    hx of ? elevated LFTs on statin Rx  . Hypertension   . Obesity   . Prostate cancer [facility])    s/p radical prostatectomy  . Spinal stenosis     Tobacco Use: Social History   Tobacco Use  Smoking Status Never Smoker  Smokeless Tobacco Current User  . Types: Chew    Labs: Recent Review [facility] for [facility] Cardiac and Pulmonary Rehab Latest  Ref Rng & Units 12/19/2016 12/19/2016 12/19/2016 12/19/2016 12/20/2016   Hemoglobin A1c 4.8 - 5.6 % - - - - -   PHART 7.350 - 7.450 7.342(L) 7.312(L) - 7.443 -   PCO2ART 32.0 - 48.0 mmHg 40.8 38.4 - 38.6 -   HCO3 20.0 - 28.0 mmol/L 22.6 19.4(L) - 26.2 -   TCO2 0 - 100 mmol/L 24 20 21 27 25    ACIDBASEDEF 0.0 - 2.0 mmol/L 3.0(H) 6.0(H) - - -   O2SAT % 96.0 99.0 - 99.0 -      Capillary Blood Glucose: Lab Results  Component Value Date   GLUCAP 106 (H) 01/05/2019   GLUCAP 113 (H) 01/02/2019   GLUCAP 108 (H) 01/02/2019   GLUCAP 113 (H) 12/31/2018   GLUCAP 90 12/31/2018     Exercise Target Goals: Exercise Program Goal: Individual exercise prescription set using results from initial 6 min walk test and THRR while considering  patient's activity barriers and  safety.   Exercise Prescription Goal: Starting with aerobic activity 30 plus minutes a day, 3 days per week for initial exercise prescription. Provide home exercise prescription and guidelines that participant acknowledges understanding prior to discharge.  Activity Barriers & Risk Stratification:   6 Minute Walk:   Oxygen Initial Assessment:   Oxygen Re-Evaluation:   Oxygen Discharge (Final Oxygen Re-Evaluation):   Initial Exercise Prescription:   Perform Capillary Blood Glucose checks as needed.  Exercise Prescription Changes: Exercise Prescription Changes    Row Name 01/07/19 1457 01/19/19 1450 02/02/19 1450 02/16/19 1452 02/20/19 1457     Response to Exercise   Blood Pressure (Admit)  120/68  104/62  108/50  112/60  118/70   Blood Pressure (Exercise)  122/80  130/72  120/78  108/72  142/72   Blood Pressure (Exit)  100/70  108/70  112/62  116/62  112/62   Heart Rate (Admit)  77 bpm  89 bpm  79 bpm  91 bpm  71 bpm   Heart Rate (Exercise)  98 bpm  105 bpm  101 bpm  111 bpm  102 bpm   Heart Rate (Exit)  70 bpm  89 bpm  75 bpm  91 bpm  69 bpm   Rating of Perceived Exertion (Exercise)  12  12  12  12  12    Symptoms  none  none  none  none  none   Duration  Progress to 30 minutes of  aerobic without signs/symptoms of physical distress  Progress to 30 minutes of  aerobic without signs/symptoms of physical distress  Progress to 30 minutes of  aerobic without signs/symptoms of physical distress  Progress to 30 minutes of  aerobic without signs/symptoms of physical distress  Progress to 30 minutes of  aerobic without signs/symptoms of physical distress   Intensity  THRR unchanged  THRR unchanged  THRR unchanged  THRR unchanged  THRR unchanged     Progression   Progression  Continue to progress workloads to maintain intensity without signs/symptoms of physical distress.  Continue to progress workloads to maintain intensity without signs/symptoms of physical distress.  Continue to  progress workloads to maintain intensity without signs/symptoms of physical distress.  Continue to progress workloads to maintain intensity without signs/symptoms of physical distress.  Continue to progress workloads to maintain intensity without signs/symptoms of physical distress.   Average METs  2.5  2.7  2.6  2.9  3     Resistance Training   Training Prescription  No Relaxation day, no weights  Yes  Yes  Yes  Yes   Weight  -  3 lbs.  4lbs  4lbs  4lbs   Reps  -  10-15  10-15  10-15  10-15   Time  -  10 Minutes  10 Minutes  10 Minutes  10 Minutes     Interval Training   Interval Training  No  No  No  No  No     Treadmill   MPH  2.5  2.6  2.6  2.6  2.6   Grade  0  0  0  1  1   Minutes  10  10  10  10  10    METs  2.91  3.07  2.99  3.35  3.35     Recumbant Bike   Level  1.5  1.5  1.5  2  2    Watts  22  -  -  -  -   Minutes  10  10  10  10  10    METs  1.8  1.9  1.9  2.1  2.2     NuStep   Level  3  3  3  4  4    SPM  85  85  85  85  85   Minutes  10  10  10  10  10    METs  2.8  3.1  3  3.2  3.4     Home Exercise Plan   Plans to continue exercise at  [facility] (comment) Patient using treadmill and recumbent bike at gym.  [facility] ([facility] Patient using treadmill and recumbent bike at gym.  [facility] (comment) Patient using treadmill and recumbent bike at gym.  [facility] (comment) Patient using treadmill and recumbent bike at gym.  [facility] (comment) Patient using treadmill and recumbent bike at gym.   Frequency  Add 2 additional days to program exercise sessions.  Add 2 additional days to program exercise sessions.  Add 2 additional days to program exercise sessions.  Add 2 additional days to program exercise sessions.  Add 2 additional days to program exercise sessions.   Initial Home Exercises Provided  01/07/19  01/07/19  01/07/19  01/07/19  01/07/19   Row Name 06/15/19 0900 06/29/19 1000           Response to Exercise   Blood  Pressure (Admit)  110/62  102/58      Blood Pressure (Exercise)  128/68  138/62      Blood Pressure (Exit)  122/68  110/70      Heart Rate (Admit)  85 bpm  78 bpm      Heart Rate (Exercise)  110 bpm  99 bpm      Heart Rate (Exit)  86 bpm  72 bpm      Rating of Perceived Exertion (Exercise)  12  12      Perceived Dyspnea (Exercise)  0  0      Symptoms  None  None      Comments  Pt's first day back since department closure  -      Duration  Continue with 30 min of aerobic exercise without signs/symptoms of physical distress.  Continue with 30 min of aerobic exercise without signs/symptoms of physical distress.      Intensity  THRR unchanged  THRR unchanged        Progression   Progression  Continue to progress workloads to maintain intensity without signs/symptoms of physical distress.  Continue to progress workloads to  maintain intensity without signs/symptoms of physical distress.      Average METs  2.65  2.9        Resistance Training   Training Prescription  Yes  Yes      Weight  3lbs  4lbs      Reps  10-15  10-15      Time  10 Minutes  10 Minutes        Interval Training   Interval Training  No  No        Recumbant Bike   Level  2  2.5      Minutes  10  15      METs  2.1  2.3        NuStep   Level  7  4      SPM  90  90      Minutes  10  15      METs  3.2  3.5        Home Exercise Plan   Plans to continue exercise at  [employer] (comment)  [employer] [employer] (comment)      Frequency  Add 2 additional days to program exercise sessions.  Add 2 additional days to program exercise sessions.      Initial Home Exercises Provided  01/07/19  01/07/19         Exercise Comments: Exercise Comments    Row Name 01/05/19 1504 01/07/19 1522 01/19/19 1450 02/02/19 1506 02/16/19 1517   Exercise Comments  Reviewed METs with patient.  Reviewed home exercise guidelines, METs, and goals with patient.  Reviewed METs and goals with patient.  METs reviewed with patient.  Reviewed  METs and goals with patient.   [employer] Name 06/15/19 3785 06/29/19 1047         Exercise Comments  Pt's first day of exericse since department closure due to COVID 19. Pt responded well to exercise and is happy to be back exercising. Will continue to monitor.  Reviewed goals with patient.         Exercise Goals and Review:   Exercise Goals Re-Evaluation : Exercise Goals Re-Evaluation    Row Name 01/05/19 1551 01/07/19 1522 01/19/19 1450 02/16/19 1517 02/25/19 1420     Exercise Goal Re-Evaluation   Exercise Goals Review  Able to understand and use rate of perceived exertion (RPE) scale;Increase Physical Activity  Able to understand and use rate of perceived exertion (RPE) scale;Increase Physical Activity;Understanding of Exercise Prescription;Able to check pulse independently;Knowledge and understanding of Target Heart Rate Range (THRR)  Able to understand and use rate of perceived exertion (RPE) scale;Increase Physical Activity;Understanding of Exercise Prescription;Able to check pulse independently;Knowledge and understanding of Target Heart Rate Range (THRR)  Able to understand and use rate of perceived exertion (RPE) scale;Increase Physical Activity;Understanding of Exercise Prescription;Able to check pulse independently;Knowledge and understanding of Target Heart Rate Range (THRR)  -   Comments  Patient able to understand and use RPE scale appropriately. Pt experienced chest tightness with exercise on the treadmill. Symptoms resolved with rest, 12 lead EKG obtained, cardiology office contacted. Pt was cleared to resume exercise, follow-up appointment with cardiology obtained.  Reviewed home exercise guidelines with patient including THRR, RPE scale, and endpoints for exercise. Pt is exercising at the gym riding recumbent bike (RB), walking on treadmill (TM), and participating in the [place] program. Pt also bowls on Monday. Pt has a FitBit to check his heart rate.  Patient continues to exercise  at gym on Tuesdays  and Thursdays: TM 20 mins, bike 30 minutes without angina.  Patient feels like he's making steady progress with exercise. Patient states that he doesn't have chest pain when exercising in the mornings at the gym but still has some chest pain with exercise at cardiac rehab. Questionable whether CP is angina vs heart burn since CR is after lunch. Pt states that he's exercising at the same or higher workloads at the gym  Temporary department closure due to COVID-19.   Expected Outcomes  Patient will exercise at level below which anginal symptoms occur.  Patient will continue to exercise at the gym on Tuesdays and Thursdays: 30 minutes on RB and 20 minutes on TM, in addition to exercise at cardiac rehab to help improve cardiorespiratory fitness.  Continue current exercise routine increasing workloads as tolerated.  Progress workloads as tolerated to help increase threshold for exercise without angina.  -   Row Name 06/29/19 1043             Exercise Goal Re-Evaluation   Exercise Goals Review  Able to understand and use rate of perceived exertion (RPE) scale;Increase Physical Activity;Understanding of Exercise Prescription;Able to check pulse independently;Knowledge and understanding of Target Heart Rate Range (THRR)       Comments  Patient tolerating retrun to exercise in the cardiac rehab program well without symptoms. Pt previously exercised at local gym, riding stationary bike, in addition to exercise at CR. Pt unable to exercise at gym at this time due to closure because of COVID-19 pandemic. 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 Encouraged pt to do stretching exercises at home and hand weights. Pt has 5lb weights at home that he's using to do resistance training. Reviewed THRR, and pt has HR monitor to check pulse.       Expected Outcomes  Patient is exercising without symptoms. Increase workloads as tolerated.           Discharge  Exercise Prescription (Final Exercise Prescription Changes): Exercise Prescription Changes - 06/29/19 1000      Response to Exercise   Blood Pressure (Admit)  102/58    Blood Pressure (Exercise)  138/62    Blood Pressure (Exit)  110/70    Heart Rate (Admit)  78 bpm    Heart Rate (Exercise)  99 bpm    Heart Rate (Exit)  72 bpm    Rating of Perceived Exertion (Exercise)  12    Perceived Dyspnea (Exercise)  0    Symptoms  None    Duration  Continue with 30 min of aerobic exercise without signs/symptoms of physical distress.    Intensity  THRR unchanged      Progression   Progression  Continue to progress workloads to maintain intensity without signs/symptoms of physical distress.    Average METs  2.9      Resistance Training   Training Prescription  Yes    Weight  4lbs    Reps  10-15    Time  10 Minutes      Interval Training   Interval Training  No      Recumbant Bike   Level  2.5    Minutes  15    METs  2.3      NuStep   Level  4    SPM  90    Minutes  15    METs  3.5      Home Exercise Plan   Plans to [facility] at  [facility] ([facility])    Frequency  Add 2 additional days to program exercise sessions.    Initial Home Exercises Provided  01/07/19       Nutrition:  Target Goals: Understanding of nutrition guidelines, daily intake of sodium 1500mg , cholesterol 200mg , calories 30% from fat and 7% or less from saturated fats, daily to have 5 or more servings of fruits and vegetables.  Biometrics:    Nutrition Therapy Plan and Nutrition Goals:   Nutrition Assessments:   Nutrition Goals Re-Evaluation:   Nutrition Goals Discharge (Final Nutrition Goals Re-Evaluation):   Psychosocial: Target Goals: Acknowledge presence or absence of significant depression and/or stress, maximize coping skills, provide positive support system. Participant is able to verbalize types and ability to use techniques and skills needed for reducing stress and  depression.  Initial Review & Psychosocial Screening:   Quality of Life Scores: Quality of Life - 07/01/19 1053      Quality of Life   Select  Quality of Life      Quality of Life Scores   Health/Function Pre  23.7 %    Health/Function Post  22.83 %    Health/Function % Change  -3.67 %    Socioeconomic Pre  27.36 %    Socioeconomic Post  26.43 %    Socioeconomic % Change   -3.4 %    Psych/Spiritual Pre  23.79 %    Psych/Spiritual Post  23.57 %    Psych/Spiritual % Change  -0.92 %    Family Pre  30 %    Family Post  28.5 %    Family % Change  -5 %    GLOBAL Pre  25.26 %    GLOBAL Post  24.44 %    GLOBAL % Change  -3.25 %      Scores of 19 and below usually indicate a poorer quality of life in these areas.  A difference of  2-3 points is a clinically meaningful difference.  A difference of 2-3 points in the total score of the Quality of Life Index has been associated with significant improvement in overall quality of life, self-image, physical symptoms, and general health in studies assessing change in quality of life.  PHQ-9: Recent Review Flowsheet Data    Depression screen [facility] 2/9 06/19/2019 12/29/2018 05/01/2017 02/11/2017   Decreased Interest 0 0 0 0   Down, Depressed, Hopeless 0 0 0 0   PHQ - 2 Score 0 0 0 0     Interpretation of Total Score  Total Score Depression Severity:  1-4 = Minimal depression, 5-9 = Mild depression, 10-14 = Moderate depression, 15-19 = Moderately severe depression, 20-27 = Severe depression   Psychosocial Evaluation and Intervention:   Psychosocial Re-Evaluation: Psychosocial Re-Evaluation    [place] Name 01/15/19 1512 02/05/19 1738 07/02/19 1353         Psychosocial Re-Evaluation   Current issues with  None Identified  None Identified  None Identified     Comments  -  no psychosocial needs identifed, no interventions necessary.   -     Expected Outcomes  -  pt will deomonstrate positive outlook with good coping skills.    pt will  deomonstrate positive outlook with good coping skills.       Interventions  Encouraged to attend Cardiac Rehabilitation for the exercise  Encouraged to attend Cardiac Rehabilitation for the exercise  Encouraged to attend Cardiac Rehabilitation for the exercise     Continue Psychosocial Services   No Follow up required  No Follow up required  No Follow up required        Psychosocial Discharge (Final Psychosocial Re-Evaluation): Psychosocial Re-Evaluation - 07/02/19 1353      Psychosocial Re-Evaluation   Current issues with  None Identified    Expected Outcomes  pt will deomonstrate positive outlook with good coping skills.      Interventions  Encouraged to attend Cardiac Rehabilitation for the exercise    Continue Psychosocial Services   No Follow up required       Vocational Rehabilitation: Provide vocational rehab assistance to qualifying candidates.   Vocational Rehab Evaluation & Intervention:   Education: Education Goals: Education classes will be provided on a weekly basis, covering required topics. Participant will state understanding/return demonstration of topics presented.  Learning Barriers/Preferences:   Education Topics: Hypertension, Hypertension Reduction -Define heart disease and high blood pressure. Discus how high blood pressure affects the body and ways to reduce high blood pressure.   Exercise and Your Heart -Discuss why it is important to exercise, the FITT principles of exercise, normal and abnormal responses to exercise, and how to exercise safely.   Angina -Discuss definition of angina, causes of angina, treatment of angina, and how to decrease risk of having angina.   Cardiac Medications -Review what the following cardiac medications are used for, how they affect the body, and side effects that may occur when taking the medications.  Medications include Aspirin, Beta blockers, calcium channel blockers, ACE Inhibitors, angiotensin receptor blockers,  diuretics, digoxin, and antihyperlipidemics.   Congestive Heart Failure -Discuss the definition of CHF, how to live with CHF, the signs and symptoms of CHF, and how keep track of weight and sodium intake.   Heart Disease and Intimacy -Discus the effect sexual activity has on the heart, how changes occur during intimacy as we age, and safety during sexual activity.   Smoking Cessation / COPD -Discuss different methods to quit smoking, the health benefits of quitting smoking, and the definition of COPD.   Nutrition I: Fats -Discuss the types of cholesterol, what cholesterol does to the heart, and how cholesterol levels can be controlled.   Nutrition II: Labels -Discuss the different components of food labels and how to read food label   Heart Parts/Heart Disease and PAD -Discuss the anatomy of the heart, the pathway of blood circulation through the heart, and these are affected by heart disease.   Stress I: Signs and Symptoms -Discuss the causes of stress, how stress may lead to anxiety and depression, and ways to limit stress.   Stress II: Relaxation -Discuss different types of relaxation techniques to limit stress.   Warning Signs of Stroke / TIA -Discuss definition of a stroke, what the signs and symptoms are of a stroke, and how to identify when someone is having stroke.   Knowledge Questionnaire Score: Knowledge Questionnaire Score - 07/01/19 1046      Knowledge Questionnaire Score   Pre Score  22/24    Post Score  22/24       Core Components/Risk Factors/Patient Goals at Admission:   Core Components/Risk Factors/Patient Goals Review:  Goals and Risk Factor Review    Row Name 01/15/19 1512 02/05/19 1739 06/19/19 1327 07/02/19 1353       Core Components/Risk Factors/Patient Goals Review   Personal Goals Review  Weight Management/Obesity;Lipids;Hypertension;Diabetes;Stress  Weight Management/Obesity;Lipids;Hypertension;Diabetes;Stress  Weight  Management/Obesity;Lipids;Hypertension;Diabetes;Stress  Weight Management/Obesity;Lipids;Hypertension;Diabetes;Stress    Review  Joneen Boers is doing well with exercise at cardiac rehab. [name]'s vital signs and CBG's have been stable.  Joneen Boers has not had any further complaints of chest pain/pressure  Joneen Boers is doing well with exercise at cardiac rehab. [name]'s vital signs and CBG's have been stable. Will continue to monitor for symptoms  Joneen Boers reutrned to exercise last week and has been doing well with exercise. Joneen Boers has been been participating in both phase 2 and virtual cardiac rehab since the program reopened  Joneen Boers has done well with exercise. [name]'s vital signs, CBG's and weights have been stable since he has returned to exercise    Expected Outcomes  pt will continue exercise, nutrition and medication regimen for CAD management.    pt will continue exercise, nutrition and medication regimen for CAD management.    pt will continue exercise, nutrition and medication regimen for CAD management  pt will continue exercise, nutrition and medication regimen for CAD management       Core Components/Risk Factors/Patient Goals at Discharge (Final Review):  Goals and Risk Factor Review - 07/02/19 1353      Core Components/Risk Factors/Patient Goals Review   Personal Goals Review  Weight Management/Obesity;Lipids;Hypertension;Diabetes;Stress    Review  Joneen Boers has done well with exercise. [name]'s vital signs, CBG's and weights have been stable since he has returned to exercise    Expected Outcomes  pt will continue exercise, nutrition and medication regimen for CAD management       ITP Comments: ITP Comments    Row Name 01/15/19 1511 02/05/19 1738 07/02/19 1350       ITP Comments  30 Day ITP Review. Joneen Boers is with good attendance and partcipation in phase 2 cardiac rehab  30 Day ITP Review. Joneen Boers is with good attendance and partcipation in phase 2 cardiac rehab  30 Day ITP Review. Joneen Boers has done  well with exercise and had good attendance since the department reopened.        Comments: See ITP comments.[name], RN,BSN 07/02/2019 1:56 PM

## 2019-07-03 ENCOUNTER — Other Ambulatory Visit: Payer: Self-pay

## 2019-07-03 ENCOUNTER — Encounter (HOSPITAL_COMMUNITY)
Admission: RE | Admit: 2019-07-03 | Discharge: 2019-07-03 | Disposition: A | Discharge status: Home / Self Care | Payer: Medicare Other | Source: Ambulatory Visit | Attending: Cardiology | Admitting: Cardiology

## 2019-07-03 ENCOUNTER — Encounter (HOSPITAL_COMMUNITY): Payer: Medicare Other

## 2019-07-03 DIAGNOSIS — Z951 Presence of aortocoronary bypass graft: Secondary | ICD-10-CM | POA: Diagnosis not present

## 2019-07-03 DIAGNOSIS — Z955 Presence of coronary angioplasty implant and graft: Secondary | ICD-10-CM

## 2019-07-03 DIAGNOSIS — I214 Non-ST elevation (NSTEMI) myocardial infarction: Secondary | ICD-10-CM | POA: Diagnosis not present

## 2019-07-06 ENCOUNTER — Encounter (HOSPITAL_COMMUNITY)
Admission: RE | Admit: 2019-07-06 | Discharge: 2019-07-06 | Disposition: A | Discharge status: Home / Self Care | Payer: Medicare Other | Source: Ambulatory Visit | Attending: Cardiology | Admitting: Cardiology

## 2019-07-06 ENCOUNTER — Encounter (HOSPITAL_COMMUNITY): Payer: Medicare Other

## 2019-07-06 ENCOUNTER — Other Ambulatory Visit: Payer: Self-pay

## 2019-07-06 DIAGNOSIS — Z955 Presence of coronary angioplasty implant and graft: Secondary | ICD-10-CM | POA: Diagnosis not present

## 2019-07-06 DIAGNOSIS — Z951 Presence of aortocoronary bypass graft: Secondary | ICD-10-CM | POA: Diagnosis not present

## 2019-07-06 DIAGNOSIS — I214 Non-ST elevation (NSTEMI) myocardial infarction: Secondary | ICD-10-CM

## 2019-07-08 ENCOUNTER — Encounter (HOSPITAL_COMMUNITY): Payer: Medicare Other

## 2019-07-08 ENCOUNTER — Other Ambulatory Visit: Payer: Self-pay

## 2019-07-08 ENCOUNTER — Encounter (HOSPITAL_COMMUNITY)
Admission: RE | Admit: 2019-07-08 | Discharge: 2019-07-08 | Disposition: A | Discharge status: Home / Self Care | Payer: Medicare Other | Source: Ambulatory Visit | Attending: Cardiology | Admitting: Cardiology

## 2019-07-08 ENCOUNTER — Encounter: Payer: Self-pay | Admitting: Cardiology

## 2019-07-08 VITALS — BP 102/58 | HR 81 | Temp 97.9°F | Ht 69.0 in | Wt 195.3 lb

## 2019-07-08 DIAGNOSIS — I214 Non-ST elevation (NSTEMI) myocardial infarction: Secondary | ICD-10-CM

## 2019-07-08 DIAGNOSIS — Z955 Presence of coronary angioplasty implant and graft: Secondary | ICD-10-CM

## 2019-07-08 DIAGNOSIS — Z951 Presence of aortocoronary bypass graft: Secondary | ICD-10-CM | POA: Diagnosis not present

## 2019-07-08 NOTE — Progress Notes (Signed)
Discharge Progress Report  Patient Details  Name: ZEKIAH CARUTH MRN: [id number] Date of Birth: 25-Jan-1946 Referring Provider:     CARDIAC REHAB PHASE II ORIENTATION from 12/23/2018 in [place]  Referring Provider  Dr. Marlou Porch       Number of Visits: 36  Reason for Discharge:  Patient reached a stable level of exercise. Patient independent in their exercise. Patient has met program and personal goals.  Smoking History:  Social History   Tobacco Use  Smoking Status Never Smoker  Smokeless Tobacco Current User  . Types: Chew    Diagnosis:  S/P NSTEMI 10/04/18 S/P DES OM 10/06/18  ADL UCSD:   Initial Exercise Prescription:   Discharge Exercise Prescription (Final Exercise Prescription Changes): Exercise Prescription Changes - 07/08/19 0855      Response to Exercise   Blood Pressure (Admit)  102/58    Blood Pressure (Exercise)  130/70    Blood Pressure (Exit)  108/64    Heart Rate (Admit)  81 bpm    Heart Rate (Exercise)  101 bpm    Heart Rate (Exit)  81 bpm    Rating of Perceived Exertion (Exercise)  12    Perceived Dyspnea (Exercise)  0    Symptoms  None    Duration  Continue with 30 min of aerobic exercise without signs/symptoms of physical distress.    Intensity  THRR unchanged      Progression   Progression  Continue to progress workloads to maintain intensity without signs/symptoms of physical distress.    Average METs  2.6      Resistance Training   Training Prescription  No   Relaxation day, no weights.     Interval Training   Interval Training  No      Recumbant Bike   Level  2.5    Minutes  15    METs  2.4      NuStep   Level  4    SPM  90    Minutes  15    METs  2.7      Home Exercise Plan   Plans to continue exercise at  [employer] (comment)    Frequency  Add 2 additional days to program exercise sessions.    Initial Home Exercises Provided  01/07/19       Functional Capacity: 6 Minute  Walk    Row Name 07/03/19 0845         6 Minute Walk   Phase  Discharge     Distance  1600 feet     Distance % Change  0 %     Distance Feet Change  0 ft     Walk Time  6 minutes     # of Rest Breaks  0     MPH  3.03     METS  3.35     RPE  13     Perceived Dyspnea   0     VO2 Peak  11.74     Symptoms  No     Resting HR  69 bpm     Resting BP  104/62     Max Ex. HR  107 bpm     Max Ex. BP  140/72     2 Minute Post BP  110/70        Psychological, QOL, Others - Outcomes: PHQ 2/9: Depression screen [facility] 2/9 07/08/2019 06/19/2019 12/29/2018 05/01/2017 02/11/2017  Decreased Interest 0 0 0 0 0  Down, Depressed, Hopeless 0 0 0 0 0  PHQ - 2 Score 0 0 0 0 0    Quality of Life: Quality of Life - 07/01/19 1053      Quality of Life   Select  Quality of Life      Quality of Life Scores   Health/Function Pre  23.7 %    Health/Function Post  22.83 %    Health/Function % Change  -3.67 %    Socioeconomic Pre  27.36 %    Socioeconomic Post  26.43 %    Socioeconomic % Change   -3.4 %    Psych/Spiritual Pre  23.79 %    Psych/Spiritual Post  23.57 %    Psych/Spiritual % Change  -0.92 %    Family Pre  30 %    Family Post  28.5 %    Family % Change  -5 %    GLOBAL Pre  25.26 %    GLOBAL Post  24.44 %    GLOBAL % Change  -3.25 %       Personal Goals: Goals established at orientation with interventions provided to work toward goal.    Personal Goals Discharge: Goals and Risk Factor Review    Row Name 01/15/19 1512 02/05/19 1739 06/19/19 1327 07/02/19 1353       Core Components/Risk Factors/Patient Goals Review   Personal Goals Review  Weight Management/Obesity;Lipids;Hypertension;Diabetes;Stress  Weight Management/Obesity;Lipids;Hypertension;Diabetes;Stress  Weight Management/Obesity;Lipids;Hypertension;Diabetes;Stress  Weight Management/Obesity;Lipids;Hypertension;Diabetes;Stress    Review  [name] is doing well with exercise at cardiac rehab. [name]'s vital signs and CBG's  have been stable. [name] has not had any further complaints of chest pain/pressure  [name] is doing well with exercise at cardiac rehab. [name]'s vital signs and CBG's have been stable. Will continue to monitor for symptoms  [name] reutrned to exercise last week and has been doing well with exercise. [name] has been been participating in both phase 2 and virtual cardiac rehab since the program reopened  [name] has done well with exercise. [name]'s vital signs, CBG's and weights have been stable since he has returned to exercise    Expected Outcomes  pt will continue exercise, nutrition and medication regimen for CAD management.    pt will continue exercise, nutrition and medication regimen for CAD management.    pt will continue exercise, nutrition and medication regimen for CAD management  pt will continue exercise, nutrition and medication regimen for CAD management       Exercise Goals and Review:   Exercise Goals Re-Evaluation: Exercise Goals Re-Evaluation    Row Name 01/19/19 1450 02/16/19 1517 02/25/19 1420 06/29/19 1043 07/08/19 0942     Exercise Goal Re-Evaluation   Exercise Goals Review  Able to understand and use rate of perceived exertion (RPE) scale;Increase Physical Activity;Understanding of Exercise Prescription;Able to check pulse independently;Knowledge and understanding of Target Heart Rate Range (THRR)  Able to understand and use rate of perceived exertion (RPE) scale;Increase Physical Activity;Understanding of Exercise Prescription;Able to check pulse independently;Knowledge and understanding of Target Heart Rate Range (THRR)  -  Able to understand and use rate of perceived exertion (RPE) scale;Increase Physical Activity;Understanding of Exercise Prescription;Able to check pulse independently;Knowledge and understanding of Target Heart Rate Range (THRR)  Able to understand and use rate of perceived exertion (RPE) scale;Increase Physical Activity;Understanding of Exercise  Prescription;Able to check pulse independently;Knowledge and understanding of Target Heart Rate Range (THRR);Increase Strength and Stamina   Comments  Patient continues to exercise at gym on Tuesdays and Thursdays: TM 20   mins, bike 30 minutes without angina.  Patient feels like he's making steady progress with exercise. Patient states that he doesn't have chest pain when exercising in the mornings at the gym but still has some chest pain with exercise at cardiac rehab. Questionable whether CP is angina vs heart burn since CR is after lunch. Pt states that he's exercising at the same or higher workloads at the gym  Temporary department closure due to COVID-19.  Patient tolerating retrun to exercise in the cardiac rehab program well without symptoms. Pt previously exercised at local gym, riding stationary bike, in addition to exercise at CR. Pt unable to exercise at gym at this time due to closure because of COVID-19 pandemic. 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 Encouraged pt to do stretching exercises at home and hand weights. Pt has 5lb weights at home that he's using to do resistance training. Reviewed THRR, and pt has HR monitor to check pulse.  Patient completed the phase 2 cardiac rehab and has progressed well. Pt plans to walk 30 minutes, as tolerated, 5 days/week to continue his exercise. Walking is limited by chronic hip pain. Pt has not c/o of angina with exercise at cardiac rehab. Pt plans to log exercise in the [employer] app through the end of August. Pt's strength increased 13% as measured by grip strength test, and flexibility improved 25% as measured by sit and reach test.   Expected Outcomes  Continue current exercise routine increasing workloads as tolerated.  Progress workloads as tolerated to help increase threshold for exercise without angina.  -  Patient is exercising without symptoms. Increase workloads as tolerated.  Patient will maintain a  regular exercise routine, walking 30 minutes, 5 days/week to maintain cardiorespiratory fitness.      Nutrition & Weight - Outcomes:  Post Biometrics - 07/08/19 0855       Post  Biometrics   Height  5' 9" (1.753 m)    Weight  88.6 kg    Waist Circumference  40 inches    Hip Circumference  42.5 inches    Waist to Hip Ratio  0.94 %    BMI (Calculated)  28.83    Triceps Skinfold  42 mm    % Body Fat  32.4 %    Grip Strength  52 kg    Flexibility  9 in    Single Leg Stand  17.87 seconds       Nutrition:   Nutrition Discharge:   Education Questionnaire Score: Knowledge Questionnaire Score - 07/01/19 1046      Knowledge Questionnaire Score   Pre Score  22/24    Post Score  22/24       Goals reviewed with patient; copy given to patient.Pt graduated from cardiac rehab program on 07/08/19 with completion of 36 exercise sessions in Phase II. Pt maintained good attendance and progressed nicely during his participation in rehab as evidenced by increased MET level.   Medication list reconciled. Repeat  PHQ score-  0.  Pt has made significant lifestyle changes and should be commended for his success. Pt feels he has achieved his goals during cardiac rehab.   Pt plans to continue exercise by walking and using the virtual cardiac rehab APP. Occasional  Intermittent PVC's noted today.Patient asymptomatic. Will fax exercise flow sheets to Dr. Skains office for review with today's ECG tracings. We are proud of [name]'s progress! Maria Whitaker, RN,BSN 07/08/2019 3:30 PM 

## 2019-07-10 ENCOUNTER — Encounter (HOSPITAL_COMMUNITY): Payer: Medicare Other

## 2019-07-16 DIAGNOSIS — Z8546 Personal history of malignant neoplasm of prostate: Secondary | ICD-10-CM | POA: Diagnosis not present

## 2019-07-23 DIAGNOSIS — Z8546 Personal history of malignant neoplasm of prostate: Secondary | ICD-10-CM | POA: Diagnosis not present

## 2019-07-23 DIAGNOSIS — N5231 Erectile dysfunction following radical prostatectomy: Secondary | ICD-10-CM | POA: Diagnosis not present

## 2019-07-23 DIAGNOSIS — N393 Stress incontinence (female) (male): Secondary | ICD-10-CM | POA: Diagnosis not present

## 2019-07-23 DIAGNOSIS — E291 Testicular hypofunction: Secondary | ICD-10-CM | POA: Diagnosis not present

## 2019-08-18 DIAGNOSIS — Z23 Encounter for immunization: Secondary | ICD-10-CM | POA: Diagnosis not present

## 2019-09-04 DIAGNOSIS — Z7984 Long term (current) use of oral hypoglycemic drugs: Secondary | ICD-10-CM | POA: Diagnosis not present

## 2019-09-04 DIAGNOSIS — Z79899 Other long term (current) drug therapy: Secondary | ICD-10-CM | POA: Diagnosis not present

## 2019-09-04 DIAGNOSIS — I2510 Atherosclerotic heart disease of native coronary artery without angina pectoris: Secondary | ICD-10-CM | POA: Diagnosis not present

## 2019-09-04 DIAGNOSIS — E119 Type 2 diabetes mellitus without complications: Secondary | ICD-10-CM | POA: Diagnosis not present

## 2019-09-04 DIAGNOSIS — I10 Essential (primary) hypertension: Secondary | ICD-10-CM | POA: Diagnosis not present

## 2019-09-04 DIAGNOSIS — E7800 Pure hypercholesterolemia, unspecified: Secondary | ICD-10-CM | POA: Diagnosis not present

## 2019-09-11 DIAGNOSIS — Z79899 Other long term (current) drug therapy: Secondary | ICD-10-CM | POA: Diagnosis not present

## 2019-09-11 DIAGNOSIS — E119 Type 2 diabetes mellitus without complications: Secondary | ICD-10-CM | POA: Diagnosis not present

## 2019-09-11 DIAGNOSIS — E7800 Pure hypercholesterolemia, unspecified: Secondary | ICD-10-CM | POA: Diagnosis not present

## 2019-09-23 ENCOUNTER — Other Ambulatory Visit: Payer: Self-pay

## 2019-09-23 ENCOUNTER — Encounter: Payer: Self-pay | Admitting: Cardiology

## 2019-09-23 ENCOUNTER — Ambulatory Visit (INDEPENDENT_AMBULATORY_CARE_PROVIDER_SITE_OTHER): Payer: Medicare Other | Admitting: Cardiology

## 2019-09-23 VITALS — BP 100/52 | HR 70 | Ht 69.0 in | Wt 197.0 lb

## 2019-09-23 DIAGNOSIS — I209 Angina pectoris, unspecified: Secondary | ICD-10-CM | POA: Diagnosis not present

## 2019-09-23 DIAGNOSIS — Z951 Presence of aortocoronary bypass graft: Secondary | ICD-10-CM

## 2019-09-23 DIAGNOSIS — I10 Essential (primary) hypertension: Secondary | ICD-10-CM

## 2019-09-23 DIAGNOSIS — E782 Mixed hyperlipidemia: Secondary | ICD-10-CM

## 2019-09-23 DIAGNOSIS — I25119 Atherosclerotic heart disease of native coronary artery with unspecified angina pectoris: Secondary | ICD-10-CM | POA: Diagnosis not present

## 2019-09-23 MED ORDER — CLOPIDOGREL BISULFATE 75 MG PO TABS: 75.0000 mg | ORAL_TABLET | Freq: Every day | ORAL | 3 refills | Status: DC

## 2019-09-23 NOTE — Progress Notes (Signed)
Cardiology Office Note:    Date:  09/23/2019   ID:  Andrew Williamson, DOB 1946/04/22, MRN [id number]  PCP:  Lujean Amel, MD  Cardiologist:  Candee Furbish, MD  Electrophysiologist:  None   Referring MD: Lujean Amel, MD     History of Present Illness:    Andrew Williamson is a 73 y.o. male here for the follow-up of coronary artery disease status post CABG 12/2016, Dr. Servando Snare, LIMA to LAD SVG to ramus SVG to circumflex, diabetes with hypertension prostate cancer.  After his bypass in October 2019 he went to [place] had non-ST elevation myocardial infarction.  Epigastric discomfort was a symptom.  Hard to distinguish between GI symptoms and cardiac symptoms.  First troponin was normal second was abnormal.  Had heart catheterization which was personally reviewed.  Obtuse marginal branch was stented.  SVG grafts were down.  LIMA was open.  Cardiac rehab.  He did have a nuclear stress test which showed anterolateral wall ischemia consistent with his cardiac cath findings.  Despite aggressive medical management he did progress to non-ST elevation myocardial infarction.  Been having HA after Brilinta, most likely IMDUR 60 from 30. Gym. Starting rehab Jan 20.  He thought maybe his headaches were associated with Brilinta but in looking back at his records his isosorbide was increased after last stent placement.  He does however have sinus drainage, occasional ice pick like headaches behind his eye when coughing.  He used to have a lot of headaches as a child but this seemed to go away.  Widower.  Retired Insurance underwriter.  09/23/2019-here for CAD follow-up. Walk 3 days a week. Doing well. Not back to gym, COVID. When walk feels a little chest fullness, different from prior to stent. Not as sharp. On incline Ease off as flatten out. Back to house gone. Still with HA at times when bend over to pick up newspaper.  He is now due to stop his Brilinta, it has been 1 year.  We will  place him on Plavix monotherapy.  Past Medical History:  Diagnosis Date  . Allergic rhinitis   . CAD (coronary artery disease) 01/07/2017   a - Myoview 12/17:  EF 55, inf-lateral ischemia; intermediate risk // b - LHC 12/05/16: oLM 50, oLAD 40, dLAD 50, RI 25, oLCx 95, mLCx 95, RCA luminal irregs // c - Echo 12/17: EF 55-60, no RWMA, Gr 1 DD // d. S/p CABG 1/18 (L-LAD, S-RI, S-LCx) // NSTEMI 10.2019 -([place], [place]) - LHC: L-LAD ok, S-LCx and S-RI occluded; PCI: DES x 2 to RI branch of LCx; Echo: EF 60-65  . 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 DJD (degenerative joint disease)    hips  . ED (erectile dysfunction)   . GERD (gastroesophageal reflux disease)   . Gynecomastia, male   . History of echocardiogram    Echo 12/17: EF 55-60, normal wall motion, grade 1 diastolic dysfunction  . History of nuclear stress test    Myoview 12/17: EF 55, inf-lateral ischemia; intermediate risk  . HLD (hyperlipidemia)    hx of ? elevated LFTs on statin Rx  . Hypertension   . Obesity   . Prostate cancer [facility])    s/p radical prostatectomy  . Spinal stenosis     Past Surgical History:  Procedure Laterality Date  . CARDIAC CATHETERIZATION N/A 12/05/2016   Procedure: Left Heart Cath and Coronary Angiography;  Surgeon: Jettie Booze, MD;  Location: [facility]  INVASIVE CV LAB;  Service: Cardiovascular;  Laterality: N/A;  . CARDIAC CATHETERIZATION N/A 12/05/2016   Procedure: Intravascular Ultrasound/IVUS;  Surgeon: Jettie Booze, MD;  Location: [facility] CV LAB;  Service: Cardiovascular;  Laterality: N/A;  . COLONOSCOPY  06/2014   FU 10 years  . CORONARY ARTERY BYPASS GRAFT N/A 12/19/2016   Procedure: CORONARY ARTERY BYPASS GRAFTING (CABG) x three , using left internal mammary artery and right leg greater saphenous vein harvested endoscopically; SVG to Ramus, SVG to circ, LIMA to LAD ;  Surgeon: [name], MD;  Location: [place];  Service: Open Heart  Surgery;  Laterality: N/A;  . LAPAROSCOPIC CHOLECYSTECTOMY     2012  . ROBOT ASSISTED LAPAROSCOPIC RADICAL PROSTATECTOMY N/A 08/11/2015   Procedure: ROBOTIC ASSISTED LAPAROSCOPIC RADICAL PROSTATECTOMY;  Surgeon: Irine Seal, MD;  Location: [facility];  Service: Urology;  Laterality: N/A;  . TEE WITHOUT CARDIOVERSION N/A 12/19/2016   Procedure: TRANSESOPHAGEAL ECHOCARDIOGRAM (TEE);  Surgeon: [name], MD;  Location: [place];  Service: Open Heart Surgery;  Laterality: N/A;    Current Medications: Current Meds  Medication Sig  . cetirizine (ZYRTEC) 10 MG tablet Take 10 mg by mouth as needed for allergies.  . CONTOUR NEXT TEST test strip USE TO TEST SUGAR ONCE A DAY  . isosorbide mononitrate (IMDUR) 60 MG 24 hr tablet Take 1 tablet (60 mg total) by mouth daily.  . lansoprazole (PREVACID) 30 MG capsule Take 1 capsule by mouth daily.  Marland Kitchen lisinopril (PRINIVIL,ZESTRIL) 5 MG tablet Take 1 tablet (5 mg total) by mouth daily.  . metFORMIN (GLUCOPHAGE) 1000 MG tablet Take 1,000 mg by mouth 2 (two) times daily with a meal.  . metoprolol tartrate (LOPRESSOR) 50 MG tablet TAKE 1.5 TABLETS (75 MG TOTAL) BY MOUTH 2 (TWO) TIMES DAILY.  . nitroGLYCERIN (NITROSTAT) 0.4 MG SL tablet Place 1 tablet (0.4 mg total) under the tongue every 5 (five) minutes as needed for chest pain.  . rosuvastatin (CRESTOR) 20 MG tablet Take 20 mg by mouth daily.   . [DISCONTINUED] aspirin EC 81 MG tablet Take 1 tablet (81 mg total) by mouth daily.  . [DISCONTINUED] BRILINTA 90 MG TABS tablet Take 1 tablet (90 mg total) by mouth 2 (two) times daily.     Allergies:   Thimerosal   Social History   Socioeconomic History  . Marital status: Widowed    Spouse name: Not on file  . Number of children: Not on file  . Years of education: Not on file  . Highest education level: Not on file  Occupational History  . Not on file  Social Needs  . Financial resource strain: Not on file  . Food insecurity    Worry: Not on file     Inability: Not on file  . Transportation needs    Medical: Not on file    Non-medical: Not on file  Tobacco Use  . Smoking status: Never Smoker  . Smokeless tobacco: Current User    Types: Chew  Substance and Sexual Activity  . Alcohol use: Yes    Comment: rarely  . Drug use: No  . Sexual activity: Not on file  Lifestyle  . Physical activity    Days per week: Not on file    Minutes per session: Not on file  . Stress: Not on file  Relationships  . Social Herbalist on phone: Not on file    Gets together: Not on file    Attends religious service:  Not on file    Active member of club or organization: Not on file    Attends meetings of clubs or organizations: Not on file    Relationship status: Not on file  Other Topics Concern  . 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  ROS:   Please see the history of present illness.    Denies any fevers chills nausea vomiting syncope bleeding all other systems reviewed and are negative.  EKGs/Labs/Other Studies Reviewed:    The following studies were reviewed today: Prior office notes, catheterization lab work EKG  EKG:  EKG is not ordered today.  Prior EKG sinus rhythm  Recent Labs: No results found for requested labs within last 8760 hours.  Recent Lipid Panel No results found for: CHOL, TRIG, HDL, CHOLHDL, VLDL, LDLCALC, LDLDIRECT  Physical Exam:    VS:  BP (!) 100/52   Pulse 70   Ht 5\' 9"  (1.753 m)   Wt 197 lb (89.4 kg)   SpO2 98%   BMI 29.09 kg/m     Wt Readings from Last  3 Encounters:  09/23/19 197 lb (89.4 kg)  07/08/19 195 lb 5.2 oz (88.6 kg)  01/30/19 199 lb (90.3 kg)    GEN: Well nourished, well developed, in no acute distress  HEENT: normal  Neck: no JVD, carotid bruits, or masses Cardiac: RRR; no murmurs, rubs, or gallops,no edema  Respiratory:  clear to auscultation bilaterally, normal work of breathing GI: soft, nontender, nondistended, + BS MS: no deformity or atrophy  Skin: warm and dry, no rash Neuro:  Alert and Oriented x 3, Strength and sensation are intact Psych: euthymic mood, full affect   ASSESSMENT:    1. Angina pectoris ([place])   2. Coronary artery disease involving native coronary artery of native heart with angina pectoris ([place])   3. S/P CABG x 3   4. Mixed hyperlipidemia   5. Essential hypertension    PLAN:    In order of problems listed above:  Prior non-ST elevation myocardial infarction - October 2019.  Stent to the obtuse marginal branch.  Actually 2 stents were placed. - All SVG grafts were closed -LIMA was patent - Currently doing well with aggressive secondary prevention, high intensity statin, isosorbide, beta-blocker.   -Transition over to clopidogrel 75 mg once a day with no aspirin.  Plavix monotherapy.  It has been 1 year.  Coronary artery disease -Continue aggressive secondary prevention efforts. -LDL 28 on Crestor high intensity dose 20 mg.  Excellent.  No myalgias.  Essential hypertension -Currently well controlled no significant changes made.  Excellent control.  Angina -At one point, we tried to decrease his isosorbide from 60 down to 30 because of some headaches he was experiencing when bending over.  He went back up to the 60.  He is still experiencing some of these headaches but they seem to be positional mostly.  Sometimes he does get it when sitting in his recliner.  I did offer him the possibility of transitioning  off of the isosorbide and starting Ranexa 500 mg twice a day.  If he is still having  difficulties with this in the next few months, we could consider this change.  Expense may be an issue however.  Continuing with conditioning efforts.  Diabetes with hypertension -Continue with metformin.  Per primary team.  3-month follow-up, Mickel Baas  Medication Adjustments/Labs and Tests Ordered: Current medicines are reviewed at length with the patient today.  Concerns regarding medicines are outlined above.  No orders of the defined types were placed in this encounter.  Meds ordered this encounter  Medications  . clopidogrel (PLAVIX) 75 MG tablet    Sig: Take 1 tablet (75 mg total) by mouth daily.    Dispense:  90 tablet    Refill:  3    Stop brilinta and aspirin    Patient Instructions  Medication Instructions:  Your physician has recommended you make the following change in your medication:  1.) stop aspirin 2.) stop Brilinta 3.) start clopidogrel (Plavix) 75 mg once a day  If you need a refill on your cardiac medications before your next appointment, please call your pharmacy.   Lab work: none If you have labs (blood work) drawn today and your tests are completely normal, you will receive your results only by: Marland Kitchen MyChart Message (if you have MyChart) OR . A paper copy in the mail If you have any lab test that is abnormal or we need to change your treatment, we will call you to review the results.  Testing/Procedures: none  Follow-Up: At [facility], you and your health needs are our priority.  As part of our continuing mission to provide you with exceptional heart care, we have created designated Provider Care Teams.  These Care Teams include your primary Cardiologist (physician) and Advanced Practice Providers (APPs -  Physician Assistants and Nurse Practitioners) who all work together to provide you with the care you need, when you need it. You will need a follow up appointment in 6 months.  Please call our office 2 months in advance to schedule this appointment.   You may see Cecilie Kicks, NP  Any Other Special Instructions Will Be Listed Below (If Applicable).       Signed, Candee Furbish, MD  09/23/2019 10:37 AM    [facility]

## 2019-09-23 NOTE — Patient Instructions (Signed)
Medication Instructions:  Your physician has recommended you make the following change in your medication:  1.) stop aspirin 2.) stop Brilinta 3.) start clopidogrel (Plavix) 75 mg once a day  If you need a refill on your cardiac medications before your next appointment, please call your pharmacy.   Lab work: none If you have labs (blood work) drawn today and your tests are completely normal, you will receive your results only by: Marland Kitchen MyChart Message (if you have MyChart) OR . A paper copy in the mail If you have any lab test that is abnormal or we need to change your treatment, we will call you to review the results.  Testing/Procedures: none  Follow-Up: At [facility], you and your health needs are our priority.  As part of our continuing mission to provide you with exceptional heart care, we have created designated Provider Care Teams.  These Care Teams include your primary Cardiologist (physician) and Advanced Practice Providers (APPs -  Physician Assistants and Nurse Practitioners) who all work together to provide you with the care you need, when you need it. You will need a follow up appointment in 6 months.  Please call our office 2 months in advance to schedule this appointment.  You may see Cecilie Kicks, NP  Any Other Special Instructions Will Be Listed Below (If Applicable).

## 2019-09-25 DIAGNOSIS — D1801 Hemangioma of skin and subcutaneous tissue: Secondary | ICD-10-CM | POA: Diagnosis not present

## 2019-09-25 DIAGNOSIS — Z85828 Personal history of other malignant neoplasm of skin: Secondary | ICD-10-CM | POA: Diagnosis not present

## 2019-09-25 DIAGNOSIS — L718 Other rosacea: Secondary | ICD-10-CM | POA: Diagnosis not present

## 2019-09-25 DIAGNOSIS — D692 Other nonthrombocytopenic purpura: Secondary | ICD-10-CM | POA: Diagnosis not present

## 2019-09-25 DIAGNOSIS — D225 Melanocytic nevi of trunk: Secondary | ICD-10-CM | POA: Diagnosis not present

## 2019-09-25 DIAGNOSIS — L821 Other seborrheic keratosis: Secondary | ICD-10-CM | POA: Diagnosis not present

## 2019-09-25 DIAGNOSIS — L570 Actinic keratosis: Secondary | ICD-10-CM | POA: Diagnosis not present

## 2019-09-25 DIAGNOSIS — L308 Other specified dermatitis: Secondary | ICD-10-CM | POA: Diagnosis not present

## 2019-09-30 ENCOUNTER — Telehealth (HOSPITAL_COMMUNITY): Payer: Self-pay | Admitting: *Deleted

## 2019-09-30 ENCOUNTER — Telehealth: Payer: Self-pay | Admitting: Cardiology

## 2019-09-30 ENCOUNTER — Encounter (HOSPITAL_COMMUNITY)
Admission: RE | Admit: 2019-09-30 | Discharge: 2019-09-30 | Disposition: A | Discharge status: Home / Self Care | Payer: Medicare Other | Source: Ambulatory Visit | Attending: Cardiology | Admitting: Cardiology

## 2019-09-30 NOTE — Telephone Encounter (Signed)
Nurse states that patient is having chest discomfort during exercise today, denies any other symptoms. Had OV with Dr. Marlou Porch on 09/23/19  Please contact patient.   Vitals today: BP 113/64 HR 57

## 2019-09-30 NOTE — Telephone Encounter (Addendum)
Andrew Williamson reports having chest discomfort while walking this morning and yesterday at home and on inclines in the neighborhood. Patient documented this symptom on the  [facility] APP. Patient denies having chest discomfort currently. Patient says that both times he had eaten something prior to going out to walk. Dr Kingsley Plan office called and notified to contact the patient. Blood pressure documented on the app 113/64. Heart rate 57.[name], RN,BSN 09/30/2019 12:23 PM

## 2019-09-30 NOTE — Telephone Encounter (Signed)
Spoke with patient regarding chest discomfort he reported.  Pt states he only reported it on the rehab app that he uses b/c he had been ignoring the question.  He states he's not having chest pain like when he needed stents but is having some discomfort no different than as described to Dr Marlou Porch during his appt last week.  He feels some "discomfort" during his walk in the mornings - when walking on an incline.  This resolves after about 1 minute of walking on a flat surface, on its own.  No pain or discomfort now.  He has not felt as though he needed SL Ntg as discomfort resolves on it's own so quickly.  Advised I will forward to Dr Marlou Porch for his knowledge.  Pt is taking medications as listed.

## 2019-10-01 MED ORDER — RANOLAZINE ER 500 MG PO TB12: 500.0000 mg | ORAL_TABLET | Freq: Two times a day (BID) | ORAL | 3 refills | Status: DC

## 2019-10-01 NOTE — Telephone Encounter (Signed)
If he is willing, we can try Ranexa 500mg  PO BID. This may help with angina.  Candee Furbish, MD

## 2019-10-01 NOTE — Telephone Encounter (Signed)
I spoke to the patient with Dr Marlou Porch' recommendation.  He will start Ranexa 500 mg bid and call with f/u.

## 2019-10-30 ENCOUNTER — Other Ambulatory Visit: Payer: Self-pay | Admitting: Cardiology

## 2019-11-15 ENCOUNTER — Other Ambulatory Visit: Payer: Self-pay | Admitting: Cardiology

## 2019-11-20 DIAGNOSIS — Z03818 Encounter for observation for suspected exposure to other biological agents ruled out: Secondary | ICD-10-CM | POA: Diagnosis not present

## 2019-11-23 ENCOUNTER — Telehealth (HOSPITAL_COMMUNITY): Payer: Self-pay | Admitting: *Deleted

## 2019-11-23 ENCOUNTER — Encounter (HOSPITAL_COMMUNITY)
Admission: RE | Admit: 2019-11-23 | Discharge: 2019-11-23 | Disposition: A | Discharge status: Home / Self Care | Payer: Self-pay | Source: Ambulatory Visit | Attending: Cardiology | Admitting: Cardiology

## 2019-11-23 DIAGNOSIS — Z955 Presence of coronary angioplasty implant and graft: Secondary | ICD-10-CM

## 2019-11-23 DIAGNOSIS — I214 Non-ST elevation (NSTEMI) myocardial infarction: Secondary | ICD-10-CM

## 2019-11-23 NOTE — Telephone Encounter (Signed)
Spoke with Joneen Boers. Will discharge from the [facility] ([facility] Joneen Boers is exercising on his own weather permitting.[name], RN,BSN 11/23/2019 12:16 PM

## 2020-01-10 ENCOUNTER — Other Ambulatory Visit: Payer: Self-pay | Admitting: Internal Medicine

## 2020-03-15 DIAGNOSIS — Z0001 Encounter for general adult medical examination with abnormal findings: Secondary | ICD-10-CM | POA: Diagnosis not present

## 2020-03-15 DIAGNOSIS — I10 Essential (primary) hypertension: Secondary | ICD-10-CM | POA: Diagnosis not present

## 2020-03-15 DIAGNOSIS — I2510 Atherosclerotic heart disease of native coronary artery without angina pectoris: Secondary | ICD-10-CM | POA: Diagnosis not present

## 2020-03-15 DIAGNOSIS — E1169 Type 2 diabetes mellitus with other specified complication: Secondary | ICD-10-CM | POA: Diagnosis not present

## 2020-03-15 DIAGNOSIS — Z79899 Other long term (current) drug therapy: Secondary | ICD-10-CM | POA: Diagnosis not present

## 2020-03-15 DIAGNOSIS — K219 Gastro-esophageal reflux disease without esophagitis: Secondary | ICD-10-CM | POA: Diagnosis not present

## 2020-03-15 DIAGNOSIS — Z125 Encounter for screening for malignant neoplasm of prostate: Secondary | ICD-10-CM | POA: Diagnosis not present

## 2020-03-15 DIAGNOSIS — E7800 Pure hypercholesterolemia, unspecified: Secondary | ICD-10-CM | POA: Diagnosis not present

## 2020-03-30 ENCOUNTER — Other Ambulatory Visit: Payer: Self-pay

## 2020-03-30 ENCOUNTER — Encounter: Payer: Self-pay | Admitting: Cardiology

## 2020-03-30 ENCOUNTER — Ambulatory Visit (INDEPENDENT_AMBULATORY_CARE_PROVIDER_SITE_OTHER): Payer: Medicare Other | Admitting: Cardiology

## 2020-03-30 VITALS — BP 122/76 | HR 77 | Ht 69.0 in | Wt 199.0 lb

## 2020-03-30 DIAGNOSIS — E119 Type 2 diabetes mellitus without complications: Secondary | ICD-10-CM

## 2020-03-30 DIAGNOSIS — I209 Angina pectoris, unspecified: Secondary | ICD-10-CM | POA: Diagnosis not present

## 2020-03-30 DIAGNOSIS — Z951 Presence of aortocoronary bypass graft: Secondary | ICD-10-CM

## 2020-03-30 DIAGNOSIS — I25119 Atherosclerotic heart disease of native coronary artery with unspecified angina pectoris: Secondary | ICD-10-CM | POA: Diagnosis not present

## 2020-03-30 NOTE — Progress Notes (Signed)
Cardiology Office Note:    Date:  03/30/2020   ID:  [name], DOB 1946/03/09, MRN [id number]  PCP:  Andrew Amel, MD  Cardiologist:  Andrew Furbish, MD  Electrophysiologist:  None   Referring MD: Andrew Amel, MD     History of Present Illness:    Andrew Williamson is a 74 y.o. male here for the follow-up of coronary artery disease status post CABG 2018.  In 2019 had non-ST elevation myocardial infarction at [place].  Obtuse marginal branch was stented.  SVG grafts were down.  LIMA was open.  Widower, retired Insurance underwriter.  Still having almost daily headaches.  Sometimes bending over can get worse.  We are stopping the isosorbide today.  See below for further details  Past Medical History:  Diagnosis Date  . Allergic rhinitis   . CAD (coronary artery disease) 01/07/2017   a - Myoview 12/17:  EF 55, inf-lateral ischemia; intermediate risk // b - LHC 12/05/16: oLM 50, oLAD 40, dLAD 50, RI 25, oLCx 95, mLCx 95, RCA luminal irregs // c - Echo 12/17: EF 55-60, no RWMA, Gr 1 DD // d. S/p CABG 1/18 (L-LAD, S-RI, S-LCx) // NSTEMI 10.2019 -([place], [place]) - LHC: L-LAD ok, S-LCx and S-RI occluded; PCI: DES x 2 to RI branch of LCx; Echo: EF 60-65  . 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 DJD (degenerative joint disease)    hips  . ED (erectile dysfunction)   . GERD (gastroesophageal reflux disease)   . Gynecomastia, male   . History of echocardiogram    Echo 12/17: EF 55-60, normal wall motion, grade 1 diastolic dysfunction  . History of nuclear stress test    Myoview 12/17: EF 55, inf-lateral ischemia; intermediate risk  . HLD (hyperlipidemia)    hx of ? elevated LFTs on statin Rx  . Hypertension   . Obesity   . Prostate cancer [facility])    s/p radical prostatectomy  . Spinal stenosis     Past Surgical History:  Procedure Laterality Date  . CARDIAC CATHETERIZATION N/A 12/05/2016   Procedure: Left Heart Cath and Coronary  Angiography;  Surgeon: Andrew Booze, MD;  Location: [place] [facility];  Service: Cardiovascular;  Laterality: N/A;  . CARDIAC CATHETERIZATION N/A 12/05/2016   Procedure: Intravascular Ultrasound/IVUS;  Surgeon: Andrew Booze, MD;  Location: [place] [facility];  Service: Cardiovascular;  Laterality: N/A;  . COLONOSCOPY  06/2014   FU 10 years  . CORONARY ARTERY BYPASS GRAFT N/A 12/19/2016   Procedure: CORONARY ARTERY BYPASS GRAFTING (CABG) x three , using left internal mammary artery and right leg greater saphenous vein harvested endoscopically; SVG to Ramus, SVG to circ, LIMA to LAD ;  Surgeon: Andrew Isaac, MD;  Location: [place];  Service: Open Heart Surgery;  Laterality: N/A;  . LAPAROSCOPIC CHOLECYSTECTOMY     2012  . ROBOT ASSISTED LAPAROSCOPIC RADICAL PROSTATECTOMY N/A 08/11/2015   Procedure: ROBOTIC ASSISTED LAPAROSCOPIC RADICAL PROSTATECTOMY;  Surgeon: Andrew Seal, MD;  [facility];  Service: Urology;  Laterality: N/A;  . TEE WITHOUT CARDIOVERSION N/A 12/19/2016   Procedure: TRANSESOPHAGEAL ECHOCARDIOGRAM (TEE);  Surgeon: Andrew Isaac, MD;  Location: [place];  Service: Open Heart Surgery;  Laterality: N/A;    Current Medications: Current Meds  Medication Sig  . cetirizine (ZYRTEC) 10 MG tablet Take 10 mg by mouth as needed for allergies.  Marland Kitchen clopidogrel (PLAVIX) 75 MG tablet Take 1 tablet (75 mg total) by  mouth daily.  . CONTOUR NEXT TEST test strip USE TO TEST SUGAR ONCE A DAY  . lansoprazole (PREVACID) 30 MG capsule Take 1 capsule by mouth daily.  Marland Kitchen lisinopril (PRINIVIL,ZESTRIL) 5 MG tablet Take 1 tablet (5 mg total) by mouth daily.  . metFORMIN (GLUCOPHAGE) 1000 MG tablet Take 1,000 mg by mouth 2 (two) times daily with a meal.  . metoprolol tartrate (LOPRESSOR) 50 MG tablet TAKE 1.5 TABLETS (75 MG TOTAL) BY MOUTH 2 (TWO) TIMES DAILY.  . ranolazine (RANEXA) 500 MG 12 hr tablet Take 1 tablet (500 mg total) by mouth 2 (two) times daily.  . rosuvastatin (CRESTOR) 20  MG tablet Take 20 mg by mouth daily.   . [DISCONTINUED] isosorbide mononitrate (IMDUR) 60 MG 24 hr tablet TAKE 1 TABLET BY MOUTH EVERY DAY     Allergies:   Thimerosal   Social History   Socioeconomic History  . Marital status: Widowed    Spouse name: Not on file  . Number of children: Not on file  . Years of education: Not on file  . Highest education level: Not on file  Occupational History  . Not on file  Tobacco Use  . Smoking status: Never Smoker  . Smokeless tobacco: Current User    Types: Chew  Substance and Sexual Activity  . Alcohol use: Yes    Comment: rarely  . Drug use: No  . Sexual activity: Not on file  Other Topics Concern  . 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 Difficulty of Paying Living Expenses:   Food Insecurity:   . Worried About Charity fundraiser in the Last Year:   . Arboriculturist in the Last Year:   Transportation Needs:   . Film/video editor (Medical):   Marland Kitchen Lack of Transportation (Non-Medical):   Physical Activity:   . Days of Exercise per Week:   . Minutes of Exercise per Session:   Stress:   . Feeling of Stress :   Social Connections:   . Frequency of Communication with Friends and Family:   . Frequency of Social Gatherings with Friends and Family:   . Attends Religious Services:   . Active Member of Clubs or Organizations:   . Attends Archivist Meetings:   Marland Kitchen Marital Status:      Family History: The patient's family history includes Heart attack in his maternal uncle; Heart attack (age of onset: 22) in his mother; Heart failure (age of onset: 94) in his father; Peripheral Artery Disease in his father.  ROS:   Please see the history of  present illness.     All other systems reviewed and are negative.  EKGs/Labs/Other Studies Reviewed:      EKG:  EKG is  ordered today.  The ekg ordered today demonstrates sinus rhythm 69 incomplete right bundle branch block  Recent Labs: No results found for requested labs within last 8760 hours.  Recent Lipid Panel No results found for: CHOL, TRIG, HDL, CHOLHDL, VLDL, LDLCALC, LDLDIRECT  Physical Exam:    VS:  BP 122/76   Pulse 77   Ht 5\' 9"  (1.753 m)   Wt 199  lb (90.3 kg)   SpO2 95%   BMI 29.39 kg/m     Wt Readings from Last 3 Encounters:  03/30/20 199 lb (90.3 kg)  09/23/19 197 lb (89.4 kg)  07/08/19 195 lb 5.2 oz (88.6 kg)     GEN:  Well nourished, well developed in no acute distress HEENT: Normal NECK: No JVD; No carotid bruits LYMPHATICS: No lymphadenopathy CARDIAC: RRR, no murmurs, rubs, gallops RESPIRATORY:  Clear to auscultation without rales, wheezing or rhonchi  ABDOMEN: Soft, non-tender, non-distended MUSCULOSKELETAL:  No edema; No deformity  SKIN: Warm and dry NEUROLOGIC:  Alert and oriented x 3 PSYCHIATRIC:  Normal affect   ASSESSMENT:    1. Coronary artery disease involving native coronary artery of native heart with angina pectoris ([place])   2. S/P CABG x 3   3. Angina pectoris ([place])   4. Type 2 diabetes mellitus without complication, without long-term current use of insulin (HCC)    PLAN:    In order of problems listed above:  Coronary artery disease status post CABG -MI [place] 2019.  Stents to obtuse marginal were placed. -All of his vein grafts were down.  His LIMA was patent. -Plavix monotherapy  Hyperlipidemia -LDL 28 on high intensity Crestor 20 mg.  No myalgias.  Continue.  Diabetes with essential hypertension -Medications reviewed no changes made.  Continue with meds.  Angina -Doing very well currently.  Very minimal symptoms when walking uphill in the neighborhood.  Ranexa.  Because of his ongoing headaches, I have decided  to discontinue his isosorbide 60 mg.  Continuing with conditioning efforts.  Medication Adjustments/Labs and Tests Ordered: Current medicines are reviewed at length with the patient today.  Concerns regarding medicines are outlined above.  Orders Placed This Encounter  Procedures  . EKG 12-Lead   No orders of the defined types were placed in this encounter.   Patient Instructions  Medication Instructions:   STOP TAKING ISOSORBIDE MONONITRATE (IMDUR) NOW  *If you need a refill on your cardiac medications before your next appointment, please call your pharmacy*   Follow-Up: At [facility], you and your health needs are our priority.  As part of our continuing mission to provide you with exceptional heart care, we have created designated Provider Care Teams.  These Care Teams include your primary Cardiologist (physician) and Advanced Practice Providers (APPs -  Physician Assistants and Nurse Practitioners) who all work together to provide you with the care you need, when you need it.  We recommend signing up for the patient portal called "MyChart".  Sign up information is provided on this After Visit Summary.  MyChart is used to connect with patients for Virtual Visits (Telemedicine).  Patients are able to view lab/test results, encounter notes, upcoming appointments, etc.  Non-urgent messages can be sent to your provider as well.   To learn more about what you can do with MyChart, go to [email or web address].    Your next appointment:   6 month(s)  The format for your next appointment:   In Person  Provider:   Candee Furbish, MD        Signed, Andrew Furbish, MD  03/30/2020 2:27 PM    [place]

## 2020-03-30 NOTE — Patient Instructions (Signed)
Medication Instructions:   STOP TAKING ISOSORBIDE MONONITRATE (IMDUR) NOW  *If you need a refill on your cardiac medications before your next appointment, please call your pharmacy*   Follow-Up: At [facility], you and your health needs are our priority.  As part of our continuing mission to provide you with exceptional heart care, we have created designated Provider Care Teams.  These Care Teams include your primary Cardiologist (physician) and Advanced Practice Providers (APPs -  Physician Assistants and Nurse Practitioners) who all work together to provide you with the care you need, when you need it.  We recommend signing up for the patient portal called "MyChart".  Sign up information is provided on this After Visit Summary.  MyChart is used to connect with patients for Virtual Visits (Telemedicine).  Patients are able to view lab/test results, encounter notes, upcoming appointments, etc.  Non-urgent messages can be sent to your provider as well.   To learn more about what you can do with MyChart, go to [email or web address].    Your next appointment:   6 month(s)  The format for your next appointment:   In Person  Provider:   Candee Furbish, MD

## 2020-07-28 DIAGNOSIS — H3562 Retinal hemorrhage, left eye: Secondary | ICD-10-CM | POA: Diagnosis not present

## 2020-07-28 DIAGNOSIS — H25013 Cortical age-related cataract, bilateral: Secondary | ICD-10-CM | POA: Diagnosis not present

## 2020-07-28 DIAGNOSIS — E113292 Type 2 diabetes mellitus with mild nonproliferative diabetic retinopathy without macular edema, left eye: Secondary | ICD-10-CM | POA: Diagnosis not present

## 2020-07-28 DIAGNOSIS — H2513 Age-related nuclear cataract, bilateral: Secondary | ICD-10-CM | POA: Diagnosis not present

## 2020-08-05 DIAGNOSIS — N393 Stress incontinence (female) (male): Secondary | ICD-10-CM | POA: Diagnosis not present

## 2020-08-05 DIAGNOSIS — Z8546 Personal history of malignant neoplasm of prostate: Secondary | ICD-10-CM | POA: Diagnosis not present

## 2020-08-05 DIAGNOSIS — N5231 Erectile dysfunction following radical prostatectomy: Secondary | ICD-10-CM | POA: Diagnosis not present

## 2020-09-16 DIAGNOSIS — Z23 Encounter for immunization: Secondary | ICD-10-CM | POA: Diagnosis not present

## 2020-09-16 DIAGNOSIS — Z79899 Other long term (current) drug therapy: Secondary | ICD-10-CM | POA: Diagnosis not present

## 2020-09-16 DIAGNOSIS — I10 Essential (primary) hypertension: Secondary | ICD-10-CM | POA: Diagnosis not present

## 2020-09-16 DIAGNOSIS — N521 Erectile dysfunction due to diseases classified elsewhere: Secondary | ICD-10-CM | POA: Diagnosis not present

## 2020-09-16 DIAGNOSIS — E1169 Type 2 diabetes mellitus with other specified complication: Secondary | ICD-10-CM | POA: Diagnosis not present

## 2020-09-16 DIAGNOSIS — E7800 Pure hypercholesterolemia, unspecified: Secondary | ICD-10-CM | POA: Diagnosis not present

## 2020-09-20 DIAGNOSIS — Z23 Encounter for immunization: Secondary | ICD-10-CM | POA: Diagnosis not present

## 2020-09-22 ENCOUNTER — Other Ambulatory Visit: Payer: Self-pay | Admitting: Cardiology

## 2020-09-27 ENCOUNTER — Other Ambulatory Visit: Payer: Self-pay

## 2020-09-27 ENCOUNTER — Ambulatory Visit (INDEPENDENT_AMBULATORY_CARE_PROVIDER_SITE_OTHER): Payer: Medicare Other | Admitting: Cardiology

## 2020-09-27 ENCOUNTER — Encounter: Payer: Self-pay | Admitting: Cardiology

## 2020-09-27 VITALS — BP 110/50 | HR 54 | Ht 69.0 in | Wt 198.0 lb

## 2020-09-27 DIAGNOSIS — I209 Angina pectoris, unspecified: Secondary | ICD-10-CM

## 2020-09-27 DIAGNOSIS — Z85828 Personal history of other malignant neoplasm of skin: Secondary | ICD-10-CM | POA: Diagnosis not present

## 2020-09-27 DIAGNOSIS — L821 Other seborrheic keratosis: Secondary | ICD-10-CM | POA: Diagnosis not present

## 2020-09-27 DIAGNOSIS — L718 Other rosacea: Secondary | ICD-10-CM | POA: Diagnosis not present

## 2020-09-27 DIAGNOSIS — I10 Essential (primary) hypertension: Secondary | ICD-10-CM

## 2020-09-27 DIAGNOSIS — Z951 Presence of aortocoronary bypass graft: Secondary | ICD-10-CM | POA: Diagnosis not present

## 2020-09-27 DIAGNOSIS — D1801 Hemangioma of skin and subcutaneous tissue: Secondary | ICD-10-CM | POA: Diagnosis not present

## 2020-09-27 DIAGNOSIS — I25119 Atherosclerotic heart disease of native coronary artery with unspecified angina pectoris: Secondary | ICD-10-CM

## 2020-09-27 DIAGNOSIS — D2361 Other benign neoplasm of skin of right upper limb, including shoulder: Secondary | ICD-10-CM | POA: Diagnosis not present

## 2020-09-27 DIAGNOSIS — L853 Xerosis cutis: Secondary | ICD-10-CM | POA: Diagnosis not present

## 2020-09-27 DIAGNOSIS — L570 Actinic keratosis: Secondary | ICD-10-CM | POA: Diagnosis not present

## 2020-09-27 DIAGNOSIS — D692 Other nonthrombocytopenic purpura: Secondary | ICD-10-CM | POA: Diagnosis not present

## 2020-09-27 MED ORDER — METOPROLOL TARTRATE 25 MG PO TABS: 25.0000 mg | ORAL_TABLET | Freq: Two times a day (BID) | ORAL | 6 refills | Status: DC

## 2020-09-27 NOTE — Patient Instructions (Signed)
Medication Instructions:  Please decrease your Metoprolol tartrate to 25 mg twice a day. Continue all other medications as listed.  *If you need a refill on your cardiac medications before your next appointment, please call your pharmacy*  Follow-Up: At [facility], you and your health needs are our priority.  As part of our continuing mission to provide you with exceptional heart care, we have created designated Provider Care Teams.  These Care Teams include your primary Cardiologist (physician) and Advanced Practice Providers (APPs -  Physician Assistants and Nurse Practitioners) who all work together to provide you with the care you need, when you need it.  We recommend signing up for the patient portal called "MyChart".  Sign up information is provided on this After Visit Summary.  MyChart is used to connect with patients for Virtual Visits (Telemedicine).  Patients are able to view lab/test results, encounter notes, upcoming appointments, etc.  Non-urgent messages can be sent to your provider as well.   To learn more about what you can do with MyChart, go to [email or web address].    Your next appointment:   4 week(s)  The format for your next appointment:   In Person  Provider:   Candee Furbish, MD  Thank you for choosing [facility]!!

## 2020-09-27 NOTE — Progress Notes (Signed)
Cardiology Office Note:    Date:  09/27/2020   ID:  Andrew Williamson, DOB 08-Sep-1946, MRN [id number]  PCP:  Lujean Amel, MD  [facility] Cardiologist:  Candee Furbish, MD  [facility] Electrophysiologist:  None   Referring MD: Lujean Amel, MD     History of Present Illness:    Andrew Williamson is a 74 y.o. male here for the follow-up of coronary artery disease post CABG in 2018.  About 1 year after his CABG he had a non-ST elevation MI in [place].  His obtuse marginal branch was stented.  His SVG grafts were down.  LIMA was open.  We stopped isosorbide after having daily headaches.  Widower, retired Education officer, community fairly well recently.  Actually been square dancing.  No anginal symptoms.  Denies any fevers chills nausea vomiting syncope bleeding.  Sometimes he will occasionally miss a dose of his medications.  He also has been battling with some erectile dysfunction recently.  He is on beta-blocker which I will try to adjust.  Past Medical History:  Diagnosis Date  . Allergic rhinitis   . CAD (coronary artery disease) 01/07/2017   a - Myoview 12/17:  EF 55, inf-lateral ischemia; intermediate risk // b - LHC 12/05/16: oLM 50, oLAD 40, dLAD 50, RI 25, oLCx 95, mLCx 95, RCA luminal irregs // c - Echo 12/17: EF 55-60, no RWMA, Gr 1 DD // d. S/p CABG 1/18 (L-LAD, S-RI, S-LCx) // NSTEMI 10.2019 -([place], [place]) - LHC: L-LAD ok, S-LCx and S-RI occluded; PCI: DES x 2 to RI branch of LCx; Echo: EF 60-65  . 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 DJD (degenerative joint disease)    hips  . ED (erectile dysfunction)   . GERD (gastroesophageal reflux disease)   . Gynecomastia, male   . History of echocardiogram    Echo 12/17: EF 55-60, normal wall motion, grade 1 diastolic dysfunction  . History of nuclear stress test    Myoview 12/17: EF 55, inf-lateral ischemia; intermediate risk  . HLD (hyperlipidemia)    hx of ? elevated  LFTs on statin Rx  . Hypertension   . Obesity   . Prostate cancer [facility])    s/p radical prostatectomy  . Spinal stenosis     Past Surgical History:  Procedure Laterality Date  . CARDIAC CATHETERIZATION N/A 12/05/2016   Procedure: Left Heart Cath and Coronary Angiography;  Surgeon: Jettie Booze, MD;  Location: [place] [facility];  Service: Cardiovascular;  Laterality: N/A;  . CARDIAC CATHETERIZATION N/A 12/05/2016   Procedure: Intravascular Ultrasound/IVUS;  Surgeon: Jettie Booze, MD;  Location: [place] [facility];  Service: Cardiovascular;  Laterality: N/A;  . COLONOSCOPY  06/2014   FU 10 years  . CORONARY ARTERY BYPASS GRAFT N/A 12/19/2016   Procedure: CORONARY ARTERY BYPASS GRAFTING (CABG) x three , using left internal mammary artery and right leg greater saphenous vein harvested endoscopically; SVG to Ramus, SVG to circ, LIMA to LAD ;  Surgeon: [name], MD;  Location: [facility];  Service: Open Heart Surgery;  Laterality: N/A;  . LAPAROSCOPIC CHOLECYSTECTOMY     2012  . ROBOT ASSISTED LAPAROSCOPIC RADICAL PROSTATECTOMY N/A 08/11/2015   Procedure: ROBOTIC ASSISTED LAPAROSCOPIC RADICAL PROSTATECTOMY;  Surgeon: Irine Seal, MD;  [facility];  Service: Urology;  Laterality: N/A;  . TEE WITHOUT CARDIOVERSION N/A 12/19/2016   Procedure: TRANSESOPHAGEAL ECHOCARDIOGRAM (TEE);  Surgeon: [name], MD;  [facility]: [facility];  Service: Open Heart Surgery;  Laterality: N/A;    Current Medications: Current Meds  Medication Sig  . cetirizine (ZYRTEC) 10 MG tablet Take 10 mg by mouth as needed for allergies.  Marland Kitchen clopidogrel (PLAVIX) 75 MG tablet TAKE 1 TABLET BY MOUTH EVERY DAY  . CONTOUR NEXT TEST test strip USE TO TEST SUGAR ONCE A DAY  . lansoprazole (PREVACID) 30 MG capsule Take 1 capsule by mouth daily.  Marland Kitchen lisinopril (PRINIVIL,ZESTRIL) 5 MG tablet Take 1 tablet (5 mg total) by mouth daily.  . metFORMIN (GLUCOPHAGE) 1000 MG tablet Take 1,000 mg by mouth 2 (two) times  daily with a meal.  . nitroGLYCERIN (NITROSTAT) 0.4 MG SL tablet PLACE 1 TABLET (0.4 MG TOTAL) UNDER THE TONGUE EVERY 5 (FIVE) MINUTES AS NEEDED FOR CHEST PAIN.  . ranolazine (RANEXA) 500 MG 12 hr tablet Take 1 tablet (500 mg total) by mouth 2 (two) times daily.  . rosuvastatin (CRESTOR) 20 MG tablet Take 20 mg by mouth daily.   . [DISCONTINUED] metoprolol tartrate (LOPRESSOR) 50 MG tablet TAKE 1.5 TABLETS (75 MG TOTAL) BY MOUTH 2 (TWO) TIMES DAILY.     Allergies:   Thimerosal   Social History   Socioeconomic History  . Marital status: Widowed    Spouse name: Not on file  . Number of children: Not on file  . Years of education: Not on file  . Highest education level: Not on file  Occupational History  . Not on file  Tobacco Use  . Smoking status: Never Smoker  . Smokeless tobacco: Current User    Types: Chew  Vaping Use  . Vaping Use: Never used  Substance and Sexual Activity  . Alcohol use: Yes    Comment: rarely  . Drug use: No  . Sexual activity: Not on file  Other Topics Concern  . 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 Difficulty of Paying Living Expenses: Not on file  Food Insecurity:   . Worried About Charity fundraiser in the Last Year: Not on file  . Ran Out of Food in the Last Year: Not on file  Transportation Needs:   . Lack of Transportation (Medical): Not on file  . Lack of Transportation (Non-Medical): Not on file  Physical Activity:   . Days of Exercise per Week: Not on file  . Minutes of Exercise per Session: Not on file  Stress:   . Feeling of Stress : Not on file  Social Connections:   . Frequency of Communication with Friends and Family: Not on file  . Frequency of  Social Gatherings with Friends and Family: Not on file  . Attends Religious Services: Not on file  . Active Member of Clubs or Organizations: Not on file  . Attends Archivist Meetings: Not on file  . Marital Status: Not on file     Family History: The patient's family history includes Heart attack in his maternal uncle; Heart attack (age of onset: 85) in his mother; Heart failure (age of onset: 76) in his father; Peripheral Artery Disease in his father.  ROS:   Please see the history of present illness.  All other systems reviewed and are negative.  EKGs/Labs/Other Studies Reviewed:    Recent Labs: No results found for requested labs within last 8760 hours.  Recent Lipid Panel No results found for: CHOL, TRIG, HDL, CHOLHDL, VLDL, LDLCALC, LDLDIRECT   Risk Assessment/Calculations:       Physical Exam:    VS:  BP (!) 110/50   Pulse (!) 54   Ht 5\' 9"  (1.753 m)   Wt 198 lb (89.8 kg)   SpO2 98%   BMI 29.24 kg/m     Wt Readings from Last 3 Encounters:  09/27/20 198 lb (89.8 kg)  03/30/20 199 lb (90.3 kg)  09/23/19 197 lb (89.4 kg)     GEN:  Well nourished, well developed in no acute distress HEENT: Normal NECK: No JVD; No carotid bruits LYMPHATICS: No lymphadenopathy CARDIAC: RRR, no murmurs, rubs, gallops RESPIRATORY:  Clear to auscultation without rales, wheezing or rhonchi  ABDOMEN: Soft, non-tender, non-distended MUSCULOSKELETAL:  No edema; No deformity  SKIN: Warm and dry NEUROLOGIC:  Alert and oriented x 3 PSYCHIATRIC:  Normal affect   ASSESSMENT:    1. Angina pectoris ([place])   2. Coronary artery disease involving native coronary artery of native heart with angina pectoris ([place])   3. S/P CABG x 3   4. Essential hypertension    PLAN:    In order of problems listed above:  CAD post CABG -LIMA is patent in 2019 cath.  SVG grafts are down.  Continue with Plavix monotherapy.  Stent to the obtuse marginal was placed in 2019. -Continue with  aggressive secondary risk factor prevention.  No change in medical management.  Hyperlipidemia -LDL excellent actually 41 on high intensity Crestor 20 mg a day.  Continue with plaque stabilization continue with current medical management.  No myalgias.  Angina -Seems to be doing fairly well.  We stopped his isosorbide 60 mg because of worsening headaches.  When he fixed his  lenses his headaches improved as well.  Erectile dysfunction -He is on metoprolol 75 twice a day.  I am going to pull this back to 25 twice a day and see how he does over the next couple weeks.  If able, I may try to pull this medication off to see if this helps with his dysfunction.  Blood pressure fairly soft right now.     Medication Adjustments/Labs and Tests Ordered: Current medicines are reviewed at length with the patient today.  Concerns regarding medicines are outlined above.  No orders of the defined types were placed in this encounter.  Meds ordered this encounter  Medications  . metoprolol tartrate (LOPRESSOR) 25 MG tablet    Sig: Take 1 tablet (25 mg total) by mouth 2 (two) times daily.    Dispense:  60 tablet    Refill:  6    Patient Instructions  Medication Instructions:  Please decrease your Metoprolol tartrate to 25 mg twice a day. Continue all other medications as listed.  *If you need a refill on your cardiac medications before your next appointment, please call your pharmacy*  Follow-Up: At [facility], you and your health needs are our priority.  As part of our continuing mission to provide you with exceptional heart care, we have created designated Provider Care Teams.  These Care Teams include your primary Cardiologist (physician) and Advanced Practice Providers (APPs -  Physician Assistants and Nurse Practitioners) who all work together to provide you with the care you need, when you need it.  We recommend signing  up for the patient portal called "MyChart".  Sign up information is  provided on this After Visit Summary.  MyChart is used to connect with patients for Virtual Visits (Telemedicine).  Patients are able to view lab/test results, encounter notes, upcoming appointments, etc.  Non-urgent messages can be sent to your provider as well.   To learn more about what you can do with MyChart, go to [email or web address].    Your next appointment:   4 week(s)  The format for your next appointment:   In Person  Provider:   Candee Furbish, MD  Thank you for choosing [facility]!!        Signed, Candee Furbish, MD  09/27/2020 2:53 PM    [place]

## 2020-10-28 ENCOUNTER — Ambulatory Visit (INDEPENDENT_AMBULATORY_CARE_PROVIDER_SITE_OTHER): Payer: Medicare Other | Admitting: Cardiology

## 2020-10-28 ENCOUNTER — Encounter: Payer: Self-pay | Admitting: Cardiology

## 2020-10-28 ENCOUNTER — Other Ambulatory Visit: Payer: Self-pay

## 2020-10-28 VITALS — BP 110/60 | HR 99 | Ht 69.0 in | Wt 199.0 lb

## 2020-10-28 DIAGNOSIS — I209 Angina pectoris, unspecified: Secondary | ICD-10-CM

## 2020-10-28 DIAGNOSIS — I10 Essential (primary) hypertension: Secondary | ICD-10-CM | POA: Diagnosis not present

## 2020-10-28 DIAGNOSIS — Z951 Presence of aortocoronary bypass graft: Secondary | ICD-10-CM | POA: Diagnosis not present

## 2020-10-28 DIAGNOSIS — I25119 Atherosclerotic heart disease of native coronary artery with unspecified angina pectoris: Secondary | ICD-10-CM

## 2020-10-28 NOTE — Patient Instructions (Signed)

## 2020-10-28 NOTE — Progress Notes (Signed)
Cardiology Office Note:    Date:  10/28/2020   ID:  Andrew Williamson, DOB [birth year], MRN [id number]  PCP:  Andrew Amel, MD  [facility] Cardiologist:  Andrew Furbish, MD  [facility] Electrophysiologist:  None   Referring MD: Andrew Amel, MD     History of Present Illness:    Andrew Williamson is a 74 y.o. male here for the follow-up of CAD CABG 2018. Non-STEMI 2019 at [place].  With obtuse marginal branch stented. Catheterization 2019 showed grafts were down.  LIMA was patent.  Tried isosorbide but had headaches.  Widowed retired Insurance underwriter.  Has been battling with some erectile dysfunction, beta-blocker was adjusted.  Past Medical History:  Diagnosis Date  . Allergic rhinitis   . CAD (coronary artery disease) 01/07/2017   a - Myoview 12/17:  EF 55, inf-lateral ischemia; intermediate risk // b - LHC 12/05/16: oLM 50, oLAD 40, dLAD 50, RI 25, oLCx 95, mLCx 95, RCA luminal irregs // c - Echo 12/17: EF 55-60, no RWMA, Gr 1 DD // d. S/p CABG 1/18 (L-LAD, S-RI, S-LCx) // NSTEMI 10.2019 -([place], [place]) - LHC: L-LAD ok, S-LCx and S-RI occluded; PCI: DES x 2 to RI branch of LCx; Echo: EF 60-65  . 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 DJD (degenerative joint disease)    hips  . ED (erectile dysfunction)   . GERD (gastroesophageal reflux disease)   . Gynecomastia, male   . History of echocardiogram    Echo 12/17: EF 55-60, normal wall motion, grade 1 diastolic dysfunction  . History of nuclear stress test    Myoview 12/17: EF 55, inf-lateral ischemia; intermediate risk  . HLD (hyperlipidemia)    hx of ? elevated LFTs on statin Rx  . Hypertension   . Obesity   . Prostate cancer [facility])    s/p radical prostatectomy  . Spinal stenosis     Past Surgical History:  Procedure Laterality Date  . CARDIAC CATHETERIZATION N/A 12/05/2016   Procedure: Left Heart Cath and Coronary Angiography;  Surgeon: Andrew Booze, MD;   Location: [place] CV LAB;  Service: Cardiovascular;  Laterality: N/A;  . CARDIAC CATHETERIZATION N/A 12/05/2016   Procedure: Intravascular Ultrasound/IVUS;  Surgeon: Andrew Booze, MD;  Location: [place] CV [facility];  Service: Cardiovascular;  Laterality: N/A;  . COLONOSCOPY  06/2014   FU 10 years  . CORONARY ARTERY BYPASS GRAFT N/A 12/19/2016   Procedure: CORONARY ARTERY BYPASS GRAFTING (CABG) x three , using left internal mammary artery and right leg greater saphenous vein harvested endoscopically; SVG to Ramus, SVG to circ, LIMA to LAD ;  Surgeon: Andrew Isaac, MD;  Location: [place];  Service: Open Heart Surgery;  Laterality: N/A;  . LAPAROSCOPIC CHOLECYSTECTOMY     2012  . ROBOT ASSISTED LAPAROSCOPIC RADICAL PROSTATECTOMY N/A 08/11/2015   Procedure: ROBOTIC ASSISTED LAPAROSCOPIC RADICAL PROSTATECTOMY;  Surgeon: Andrew Seal, MD;  [facility];  Service: Urology;  Laterality: N/A;  . TEE WITHOUT CARDIOVERSION N/A 12/19/2016   Procedure: TRANSESOPHAGEAL ECHOCARDIOGRAM (TEE);  Surgeon: Andrew Isaac, MD;  Location: [place];  Service: Open Heart Surgery;  Laterality: N/A;    Current Medications: Current Meds  Medication Sig  . cetirizine (ZYRTEC) 10 MG tablet Take 10 mg by mouth as needed for allergies.  Marland Kitchen clopidogrel (PLAVIX) 75 MG tablet TAKE 1 TABLET BY MOUTH EVERY DAY  . CONTOUR NEXT TEST test strip USE TO TEST SUGAR ONCE A DAY  .  lansoprazole (PREVACID) 30 MG capsule Take 1 capsule by mouth daily.  Marland Kitchen lisinopril (PRINIVIL,ZESTRIL) 5 MG tablet Take 1 tablet (5 mg total) by mouth daily.  . metFORMIN (GLUCOPHAGE) 1000 MG tablet Take 1,000 mg by mouth 2 (two) times daily with a meal.  . metoprolol tartrate (LOPRESSOR) 25 MG tablet Take 1 tablet (25 mg total) by mouth 2 (two) times daily.  . nitroGLYCERIN (NITROSTAT) 0.4 MG SL tablet PLACE 1 TABLET (0.4 MG TOTAL) UNDER THE TONGUE EVERY 5 (FIVE) MINUTES AS NEEDED FOR CHEST PAIN.  . ranolazine (RANEXA) 500 MG 12 hr tablet Take 1  tablet (500 mg total) by mouth 2 (two) times daily.  . rosuvastatin (CRESTOR) 20 MG tablet Take 20 mg by mouth daily.      Allergies:   Thimerosal   Social History   Socioeconomic History  . Marital status: Widowed    Spouse name: Not on file  . Number of children: Not on file  . Years of education: Not on file  . Highest education level: Not on file  Occupational History  . Not on file  Tobacco Use  . Smoking status: Never Smoker  . Smokeless tobacco: Current User    Types: Chew  Vaping Use  . Vaping Use: Never used  Substance and Sexual Activity  . Alcohol use: Yes    Comment: rarely  . Drug use: No  . Sexual activity: Not on file  Other Topics Concern  . 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 Difficulty of Paying Living Expenses: Not on file  Food Insecurity:   . Worried About Charity fundraiser in the Last Year: Not on file  . Ran Out of Food in the Last Year: Not on file  Transportation Needs:   . Lack of Transportation (Medical): Not on file  . Lack of Transportation (Non-Medical): Not on file  Physical Activity:   . Days of Exercise per Week: Not on file  . Minutes of Exercise per Session: Not on file  Stress:   . Feeling of Stress : Not on file  Social Connections:   . Frequency of Communication with Friends and Family: Not on file  . Frequency of Social Gatherings with Friends and Family: Not on file  . Attends Religious Services: Not on file  . Active Member of Clubs or Organizations: Not on file  . Attends Archivist Meetings: Not on file  . Marital Status: Not on file     Family History: The patient's family history includes Heart attack in his maternal uncle;  Heart attack (age of onset: 69) in his mother; Heart failure (age of onset: 19) in his father; Peripheral Artery Disease in his father.  ROS:   Please see the history of present illness.     All other systems reviewed and are negative.  EKGs/Labs/Other Studies Reviewed:    The following studies were reviewed today: Prior cath reviewed  EKG: 03/30/2020-sinus rhythm no changes  Recent Labs: No results found for requested labs within last 8760 hours.  Recent Lipid Panel No results found for: CHOL, TRIG, HDL, CHOLHDL, VLDL, LDLCALC, LDLDIRECT   Risk Assessment/Calculations:  Physical Exam:    VS:  BP 110/60   Pulse 99   Ht 5\' 9"  (1.753 m)   Wt 199 lb (90.3 kg)   SpO2 97%   BMI 29.39 kg/m     Wt Readings from Last 3 Encounters:  10/28/20 199 lb (90.3 kg)  09/27/20 198 lb (89.8 kg)  03/30/20 199 lb (90.3 kg)     GEN:  Well nourished, well developed in no acute distress HEENT: Normal NECK: No JVD; No carotid bruits LYMPHATICS: No lymphadenopathy CARDIAC: RRR, no murmurs, rubs, gallops RESPIRATORY:  Clear to auscultation without rales, wheezing or rhonchi  ABDOMEN: Soft, non-tender, non-distended MUSCULOSKELETAL:  No edema; No deformity  SKIN: Warm and dry NEUROLOGIC:  Alert and oriented x 3 PSYCHIATRIC:  Normal affect   ASSESSMENT:    1. Coronary artery disease involving native coronary artery of native heart with angina pectoris ([place])   2. S/P CABG x 3   3. Essential hypertension    PLAN:    In order of problems listed above:  CAD post CABG -LIMA was patent on 2019 cath.  Obtuse marginal stent placed in 2019. -Aggressive risk factor modification. -Overall doing well.  No anginal symptoms.  Hyperlipidemia -LDL last checked 41 from outside labs with ALT of 11 normal liver function.  Excellent.  Crestor 20 mg a day.  Angina -We had stopped his isosorbide 60 mg because of worsening headaches.  Lenses and glasses felt better.  Overall been doing quite  well.  He is now on lower dose of metoprolol 25 twice daily and feeling well.  He has a watch that checks his pulse and blood pressure.  They have been wonderful.  Erectile dysfunction -Was on metoprolol 75 mg a day.  I pulled back to 25 mg a day to see how he does.  May be able to stop this medication.  Blood pressure has been fairly soft.  HR 68 on avg on watch. Have tried Cialis and Viagra but after prostate surgery no success. Vacuum pump suggested. Dr. Jeffie Pollock.  -Continue with the low-dose metoprolol 25 twice a day.  No other changes made.   Shared Decision Making/Informed Consent        Medication Adjustments/Labs and Tests Ordered: Current medicines are reviewed at length with the patient today.  Concerns regarding medicines are outlined above.  No orders of the defined types were placed in this encounter.  No orders of the defined types were placed in this encounter.   Patient Instructions  Medication Instructions:  The current medical regimen is effective;  continue present plan and medications.  *If you need a refill on your cardiac medications before your next appointment, please call your pharmacy*  Follow-Up: At [facility], you and your health needs are our priority.  As part of our continuing mission to provide you with exceptional heart care, we have created designated Provider Care Teams.  These Care Teams include your primary Cardiologist (physician) and Advanced Practice Providers (APPs -  Physician Assistants and Nurse Practitioners) who all work together to provide you with the care you need, when you need it.  We recommend signing up for the patient portal called "MyChart".  Sign up information is provided on this After Visit Summary.  MyChart is used to connect with patients for Virtual Visits (Telemedicine).  Patients are able to view lab/test results, encounter notes, upcoming appointments, etc.  Non-urgent messages can be sent to your provider as well.   To  learn more about what you  can do with MyChart, go to [email or web address].    Your next appointment:   6 month(s)  The format for your next appointment:   In Person  Provider:   Candee Furbish, MD   Thank you for choosing [facility]!!         Signed, Andrew Furbish, MD  10/28/2020 10:26 AM    [place]

## 2020-11-11 ENCOUNTER — Other Ambulatory Visit: Payer: Self-pay | Admitting: Internal Medicine

## 2020-12-15 ENCOUNTER — Ambulatory Visit
Admission: RE | Admit: 2020-12-15 | Discharge: 2020-12-15 | Disposition: A | Discharge status: Home / Self Care | Payer: Self-pay | Source: Ambulatory Visit | Attending: Cardiology | Admitting: Cardiology

## 2020-12-15 ENCOUNTER — Other Ambulatory Visit: Payer: Self-pay

## 2020-12-15 DIAGNOSIS — I25119 Atherosclerotic heart disease of native coronary artery with unspecified angina pectoris: Secondary | ICD-10-CM

## 2020-12-15 NOTE — Progress Notes (Signed)
Cath films uploaded to [employer] and order placed for linking. Ticket created and message sent to IT to link in Epic.

## 2020-12-23 ENCOUNTER — Other Ambulatory Visit: Payer: Self-pay | Admitting: Cardiology

## 2021-01-26 DIAGNOSIS — E113292 Type 2 diabetes mellitus with mild nonproliferative diabetic retinopathy without macular edema, left eye: Secondary | ICD-10-CM | POA: Diagnosis not present

## 2021-03-18 ENCOUNTER — Other Ambulatory Visit: Payer: Self-pay | Admitting: Cardiology

## 2021-03-21 ENCOUNTER — Other Ambulatory Visit: Payer: Self-pay | Admitting: Cardiology

## 2021-03-21 DIAGNOSIS — Z0001 Encounter for general adult medical examination with abnormal findings: Secondary | ICD-10-CM | POA: Diagnosis not present

## 2021-03-21 DIAGNOSIS — I10 Essential (primary) hypertension: Secondary | ICD-10-CM | POA: Diagnosis not present

## 2021-03-21 DIAGNOSIS — K219 Gastro-esophageal reflux disease without esophagitis: Secondary | ICD-10-CM | POA: Diagnosis not present

## 2021-03-21 DIAGNOSIS — E113292 Type 2 diabetes mellitus with mild nonproliferative diabetic retinopathy without macular edema, left eye: Secondary | ICD-10-CM | POA: Diagnosis not present

## 2021-03-21 DIAGNOSIS — I2510 Atherosclerotic heart disease of native coronary artery without angina pectoris: Secondary | ICD-10-CM | POA: Diagnosis not present

## 2021-03-21 DIAGNOSIS — Z79899 Other long term (current) drug therapy: Secondary | ICD-10-CM | POA: Diagnosis not present

## 2021-03-21 DIAGNOSIS — E7800 Pure hypercholesterolemia, unspecified: Secondary | ICD-10-CM | POA: Diagnosis not present

## 2021-03-29 DIAGNOSIS — K529 Noninfective gastroenteritis and colitis, unspecified: Secondary | ICD-10-CM | POA: Diagnosis not present

## 2021-03-29 DIAGNOSIS — I10 Essential (primary) hypertension: Secondary | ICD-10-CM | POA: Diagnosis not present

## 2021-03-29 DIAGNOSIS — E119 Type 2 diabetes mellitus without complications: Secondary | ICD-10-CM | POA: Diagnosis not present

## 2021-03-29 DIAGNOSIS — R1032 Left lower quadrant pain: Secondary | ICD-10-CM | POA: Diagnosis not present

## 2021-03-29 DIAGNOSIS — R112 Nausea with vomiting, unspecified: Secondary | ICD-10-CM | POA: Diagnosis not present

## 2021-03-29 DIAGNOSIS — R1110 Vomiting, unspecified: Secondary | ICD-10-CM | POA: Diagnosis not present

## 2021-03-29 DIAGNOSIS — Z79899 Other long term (current) drug therapy: Secondary | ICD-10-CM | POA: Diagnosis not present

## 2021-04-14 DIAGNOSIS — I2510 Atherosclerotic heart disease of native coronary artery without angina pectoris: Secondary | ICD-10-CM | POA: Diagnosis not present

## 2021-04-14 DIAGNOSIS — U071 COVID-19: Secondary | ICD-10-CM | POA: Diagnosis not present

## 2021-04-14 DIAGNOSIS — I10 Essential (primary) hypertension: Secondary | ICD-10-CM | POA: Diagnosis not present

## 2021-04-17 ENCOUNTER — Other Ambulatory Visit (HOSPITAL_BASED_OUTPATIENT_CLINIC_OR_DEPARTMENT_OTHER): Payer: Self-pay

## 2021-04-17 MED ORDER — PAXLOVID 20 X 150 MG & 10 X 100MG PO TBPK
ORAL_TABLET | ORAL | 0 refills | Status: DC
  Filled 2021-04-17: qty 20, 5d supply, fill #0

## 2021-06-26 ENCOUNTER — Ambulatory Visit (INDEPENDENT_AMBULATORY_CARE_PROVIDER_SITE_OTHER): Payer: Medicare Other | Admitting: Cardiology

## 2021-06-26 ENCOUNTER — Other Ambulatory Visit: Payer: Self-pay

## 2021-06-26 VITALS — BP 120/64 | HR 83 | Ht 69.0 in | Wt 198.0 lb

## 2021-06-26 DIAGNOSIS — Z951 Presence of aortocoronary bypass graft: Secondary | ICD-10-CM

## 2021-06-26 DIAGNOSIS — I10 Essential (primary) hypertension: Secondary | ICD-10-CM | POA: Diagnosis not present

## 2021-06-26 DIAGNOSIS — I25119 Atherosclerotic heart disease of native coronary artery with unspecified angina pectoris: Secondary | ICD-10-CM | POA: Diagnosis not present

## 2021-06-26 DIAGNOSIS — E782 Mixed hyperlipidemia: Secondary | ICD-10-CM | POA: Diagnosis not present

## 2021-06-26 NOTE — Patient Instructions (Signed)
Medication Instructions:  Please discontinue your Metoprolol. Continue all other medications aslisted.  *If you need a refill on your cardiac medications before your next appointment, please call your pharmacy*  Follow-Up: At [facility], you and your health needs are our priority.  As part of our continuing mission to provide you with exceptional heart care, we have created designated Provider Care Teams.  These Care Teams include your primary Cardiologist (physician) and Advanced Practice Providers (APPs -  Physician Assistants and Nurse Practitioners) who all work together to provide you with the care you need, when you need it.  We recommend signing up for the patient portal called "MyChart".  Sign up information is provided on this After Visit Summary.  MyChart is used to connect with patients for Virtual Visits (Telemedicine).  Patients are able to view lab/test results, encounter notes, upcoming appointments, etc.  Non-urgent messages can be sent to your provider as well.   To learn more about what you can do with MyChart, go to [email or web address].    Your next appointment:   1 year(s)  The format for your next appointment:   In Person  Provider:   Candee Furbish, MD   Thank you for choosing [facility]!!

## 2021-06-26 NOTE — Progress Notes (Signed)
Cardiology Office Note:    Date:  06/26/2021   ID:  Andrew Williamson, DOB [birth year], MRN [id number]  PCP:  Lujean Amel, MD   [facility] Providers Cardiologist:  Candee Furbish, MD     Referring MD: Lujean Amel, MD    History of Present Illness:    Andrew Williamson is a 75 y.o. male here for the follow up of CAD post CABG 2018  Had NSTEMI 2019 in [place]. OM stented SVG Grafts were down LIMA patent.   Isosorbide HA. Still having light HA.   Less energy. No CP, no SOB.   Widowed retired Insurance underwriter.  Past Medical History:  Diagnosis Date   Allergic rhinitis    CAD (coronary artery disease) 01/07/2017   a - Myoview 12/17:  EF 55, inf-lateral ischemia; intermediate risk // b - LHC 12/05/16: oLM 50, oLAD 40, dLAD 50, RI 25, oLCx 95, mLCx 95, RCA luminal irregs // c - Echo 12/17: EF 55-60, no RWMA, Gr 1 DD // d. S/p CABG 1/18 (L-LAD, S-RI, S-LCx) // NSTEMI 10.2019 -([place], [place]) - LHC: L-LAD ok, S-LCx and S-RI occluded; PCI: DES x 2 to RI branch of LCx; Echo: EF 60-65   Diabetes mellitus without complication ([place])    with microalbuminuria   DJD (degenerative joint disease)    hips   ED (erectile dysfunction)    GERD (gastroesophageal reflux disease)    Gynecomastia, male    History of echocardiogram    Echo 12/17: EF 55-60, normal wall motion, grade 1 diastolic dysfunction   History of nuclear stress test    Myoview 12/17: EF 55, inf-lateral ischemia; intermediate risk   HLD (hyperlipidemia)    hx of ? elevated LFTs on statin Rx   Hypertension    Obesity    Prostate cancer [facility])    s/p radical prostatectomy   Spinal stenosis     Past Surgical History:  Procedure Laterality Date   CARDIAC CATHETERIZATION N/A 12/05/2016   Procedure: Left Heart Cath and Coronary Angiography;  Surgeon: Jettie Booze, MD;  Location: [place] [facility];  Service: Cardiovascular;  Laterality: N/A;   CARDIAC CATHETERIZATION N/A 12/05/2016   Procedure:  Intravascular Ultrasound/IVUS;  Surgeon: Jettie Booze, MD;  Location: [place] [facility];  Service: Cardiovascular;  Laterality: N/A;   COLONOSCOPY  06/2014   FU 10 years   CORONARY ARTERY BYPASS GRAFT N/A 12/19/2016   Procedure: CORONARY ARTERY BYPASS GRAFTING (CABG) x three , using left internal mammary artery and right leg greater saphenous vein harvested endoscopically; SVG to Ramus, SVG to circ, LIMA to LAD ;  Surgeon: [name], MD;  Location: [place];  Service: Open Heart Surgery;  Laterality: N/A;   LAPAROSCOPIC CHOLECYSTECTOMY     2012   ROBOT ASSISTED LAPAROSCOPIC RADICAL PROSTATECTOMY N/A 08/11/2015   Procedure: ROBOTIC ASSISTED LAPAROSCOPIC RADICAL PROSTATECTOMY;  Surgeon: Irine Seal, MD;  [facility];  Service: Urology;  Laterality: N/A;   TEE WITHOUT CARDIOVERSION N/A 12/19/2016   Procedure: TRANSESOPHAGEAL ECHOCARDIOGRAM (TEE);  Surgeon: [name], MD;  Location: [place];  Service: Open Heart Surgery;  Laterality: N/A;    Current Medications: Current Meds  Medication Sig   cetirizine (ZYRTEC) 10 MG tablet Take 10 mg by mouth as needed for allergies.   clopidogrel (PLAVIX) 75 MG tablet TAKE 1 TABLET BY MOUTH EVERY DAY   CONTOUR NEXT TEST test strip USE TO TEST SUGAR ONCE A DAY   lansoprazole (PREVACID) 30 MG capsule  Take 1 capsule by mouth daily.   lisinopril (PRINIVIL,ZESTRIL) 5 MG tablet Take 1 tablet (5 mg total) by mouth daily.   metFORMIN (GLUCOPHAGE) 1000 MG tablet Take 1,000 mg by mouth 2 (two) times daily with a meal.   Nirmatrelvir & Ritonavir (PAXLOVID) 20 x 150 MG & 10 x 100MG  TBPK Take 2 tablets (1 nirmatrelvir and 1 ritonavir) by mouth twice daily for 5 days   nitroGLYCERIN (NITROSTAT) 0.4 MG SL tablet PLACE 1 TABLET (0.4 MG TOTAL) UNDER THE TONGUE EVERY 5 (FIVE) MINUTES AS NEEDED FOR CHEST PAIN.   ranolazine (RANEXA) 500 MG 12 hr tablet TAKE 1 TABLET BY MOUTH TWICE A DAY   rosuvastatin (CRESTOR) 20 MG tablet Take 20 mg by mouth daily.     [DISCONTINUED] metoprolol tartrate (LOPRESSOR) 25 MG tablet TAKE 1 TABLET BY MOUTH TWICE A DAY     Allergies:   Thimerosal   Social History   Socioeconomic History   Marital status: Widowed    Spouse name: Not on file   Number of children: Not on file   Years of education: Not on file   Highest education level: Not on file  Occupational History   Not on file  Tobacco Use   Smoking status: Never   Smokeless tobacco: Current    Types: Chew  Vaping Use   Vaping Use: Never used  Substance and Sexual Activity   Alcohol use: Yes    Comment: rarely   Drug use: No   Sexual activity: Not on file  Other Topics Concern   Not on file  Social History Narrative   Widower wife died at 31 of breast CA   Children: 2 step daughters   Retired - Insurance underwriter   Native of [place] 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  ROS:   Please see the history of present illness.     All other systems reviewed and are negative.  EKGs/Labs/Other Studies Reviewed:    The following studies were reviewed today: Cath 2019 at [facility] reviewed in Chart. OM stent.   EKG:  Today - SR 83 bpm Prior NSR  Recent Labs: No results found for requested labs within last 8760 hours.  Recent Lipid Panel No results found for: CHOL, TRIG, HDL, CHOLHDL, VLDL, LDLCALC, LDLDIRECT   Risk Assessment/Calculations:          Physical Exam:    VS:  BP 120/64 (BP Location: Left Arm, Patient Position:  Sitting, Cuff Size: Normal)   Pulse 83   Ht 5\' 9"  (1.753 m)   Wt 198 lb (89.8 kg)   SpO2 98%   BMI 29.24 kg/m     Wt Readings from Last 3 Encounters:  06/26/21 198 lb (89.8 kg)  10/28/20 199 lb (90.3 kg)  09/27/20 198 lb (89.8 kg)     GEN:  Well nourished, well developed in no acute distress HEENT: Normal NECK: No JVD; No carotid bruits LYMPHATICS: No lymphadenopathy  CARDIAC: RRR, no murmurs, rubs, gallops RESPIRATORY:  Clear to auscultation without rales, wheezing or rhonchi  ABDOMEN: Soft, non-tender, non-distended MUSCULOSKELETAL:  No edema; No deformity  SKIN: Warm and dry NEUROLOGIC:  Alert and oriented x 3 PSYCHIATRIC:  Normal affect   ASSESSMENT:    1. Coronary artery disease involving native coronary artery of native heart with angina pectoris ([place])   2. S/P CABG x 3   3. Essential hypertension   4. Mixed hyperlipidemia    PLAN:    In order of problems listed above:  CAD post CABG -- SVG grafts are down, LIMA is patent --OM stent in 2019 in setting of MI. No angina since.  -- Plavix - bruising.   Hyperlipidemia --Continue with medical management with Crestor 20mg  PO QD. No myalgias.  Angina --Has not had any chest pain since 2019 stent --HA with long acting nitrates.  --Metoprolol 25mg  BID.  We will stop it. See if this helps with energy. Has not been walking. His friend has a new border collie. He will be walking it.   ED --now on lower dose metoprolol  COVID in 5/22 --took Paxlovid. Sinus congestion. No HA.        Medication Adjustments/Labs and Tests Ordered: Current medicines are reviewed at length with the patient today.  Concerns regarding medicines are outlined above.  Orders Placed This Encounter  Procedures   EKG 12-Lead    No orders of the defined types were placed in this encounter.   Patient Instructions  Medication Instructions:  Please discontinue your Metoprolol. Continue all other medications aslisted.  *If you need a  refill on your cardiac medications before your next appointment, please call your pharmacy*  Follow-Up: At [facility], you and your health needs are our priority.  As part of our continuing mission to provide you with exceptional heart care, we have created designated Provider Care Teams.  These Care Teams include your primary Cardiologist (physician) and Advanced Practice Providers (APPs -  Physician Assistants and Nurse Practitioners) who all work together to provide you with the care you need, when you need it.  We recommend signing up for the patient portal called "MyChart".  Sign up information is provided on this After Visit Summary.  MyChart is used to connect with patients for Virtual Visits (Telemedicine).  Patients are able to view lab/test results, encounter notes, upcoming appointments, etc.  Non-urgent messages can be sent to your provider as well.   To learn more about what you can do with MyChart, go to [email or web address].    Your next appointment:   1 year(s)  The format for your next appointment:   In Person  Provider:   Candee Furbish, MD   Thank you for choosing [facility]!!     Signed, Candee Furbish, MD  06/26/2021 10:09 AM    [place]

## 2021-07-31 DIAGNOSIS — Z8546 Personal history of malignant neoplasm of prostate: Secondary | ICD-10-CM | POA: Diagnosis not present

## 2021-08-02 DIAGNOSIS — H3562 Retinal hemorrhage, left eye: Secondary | ICD-10-CM | POA: Diagnosis not present

## 2021-08-02 DIAGNOSIS — H43812 Vitreous degeneration, left eye: Secondary | ICD-10-CM | POA: Diagnosis not present

## 2021-08-02 DIAGNOSIS — H2513 Age-related nuclear cataract, bilateral: Secondary | ICD-10-CM | POA: Diagnosis not present

## 2021-08-02 DIAGNOSIS — H25013 Cortical age-related cataract, bilateral: Secondary | ICD-10-CM | POA: Diagnosis not present

## 2021-08-07 DIAGNOSIS — Z8546 Personal history of malignant neoplasm of prostate: Secondary | ICD-10-CM | POA: Diagnosis not present

## 2021-08-07 DIAGNOSIS — N393 Stress incontinence (female) (male): Secondary | ICD-10-CM | POA: Diagnosis not present

## 2021-08-07 DIAGNOSIS — N5231 Erectile dysfunction following radical prostatectomy: Secondary | ICD-10-CM | POA: Diagnosis not present

## 2021-08-31 DIAGNOSIS — Z23 Encounter for immunization: Secondary | ICD-10-CM | POA: Diagnosis not present

## 2021-09-20 DIAGNOSIS — E7800 Pure hypercholesterolemia, unspecified: Secondary | ICD-10-CM | POA: Diagnosis not present

## 2021-09-20 DIAGNOSIS — K219 Gastro-esophageal reflux disease without esophagitis: Secondary | ICD-10-CM | POA: Diagnosis not present

## 2021-09-20 DIAGNOSIS — E1169 Type 2 diabetes mellitus with other specified complication: Secondary | ICD-10-CM | POA: Diagnosis not present

## 2021-09-20 DIAGNOSIS — I10 Essential (primary) hypertension: Secondary | ICD-10-CM | POA: Diagnosis not present

## 2021-09-20 DIAGNOSIS — E113292 Type 2 diabetes mellitus with mild nonproliferative diabetic retinopathy without macular edema, left eye: Secondary | ICD-10-CM | POA: Diagnosis not present

## 2021-09-20 DIAGNOSIS — I2510 Atherosclerotic heart disease of native coronary artery without angina pectoris: Secondary | ICD-10-CM | POA: Diagnosis not present

## 2021-09-20 DIAGNOSIS — Z79899 Other long term (current) drug therapy: Secondary | ICD-10-CM | POA: Diagnosis not present

## 2021-09-22 DIAGNOSIS — Z23 Encounter for immunization: Secondary | ICD-10-CM | POA: Diagnosis not present

## 2021-09-27 DIAGNOSIS — L570 Actinic keratosis: Secondary | ICD-10-CM | POA: Diagnosis not present

## 2021-09-27 DIAGNOSIS — L718 Other rosacea: Secondary | ICD-10-CM | POA: Diagnosis not present

## 2021-09-27 DIAGNOSIS — D692 Other nonthrombocytopenic purpura: Secondary | ICD-10-CM | POA: Diagnosis not present

## 2021-09-27 DIAGNOSIS — L853 Xerosis cutis: Secondary | ICD-10-CM | POA: Diagnosis not present

## 2021-09-27 DIAGNOSIS — D2271 Melanocytic nevi of right lower limb, including hip: Secondary | ICD-10-CM | POA: Diagnosis not present

## 2021-09-27 DIAGNOSIS — D1801 Hemangioma of skin and subcutaneous tissue: Secondary | ICD-10-CM | POA: Diagnosis not present

## 2021-09-27 DIAGNOSIS — L821 Other seborrheic keratosis: Secondary | ICD-10-CM | POA: Diagnosis not present

## 2021-09-27 DIAGNOSIS — L723 Sebaceous cyst: Secondary | ICD-10-CM | POA: Diagnosis not present

## 2021-09-27 DIAGNOSIS — L814 Other melanin hyperpigmentation: Secondary | ICD-10-CM | POA: Diagnosis not present

## 2021-12-01 ENCOUNTER — Other Ambulatory Visit: Payer: Self-pay | Admitting: Cardiology

## 2022-02-21 DIAGNOSIS — L578 Other skin changes due to chronic exposure to nonionizing radiation: Secondary | ICD-10-CM | POA: Diagnosis not present

## 2022-02-21 DIAGNOSIS — L300 Nummular dermatitis: Secondary | ICD-10-CM | POA: Diagnosis not present

## 2022-03-05 DIAGNOSIS — U071 COVID-19: Secondary | ICD-10-CM | POA: Diagnosis not present

## 2022-04-24 DIAGNOSIS — D649 Anemia, unspecified: Secondary | ICD-10-CM | POA: Diagnosis not present

## 2022-04-24 DIAGNOSIS — N521 Erectile dysfunction due to diseases classified elsewhere: Secondary | ICD-10-CM | POA: Diagnosis not present

## 2022-04-24 DIAGNOSIS — E113292 Type 2 diabetes mellitus with mild nonproliferative diabetic retinopathy without macular edema, left eye: Secondary | ICD-10-CM | POA: Diagnosis not present

## 2022-04-24 DIAGNOSIS — Z0000 Encounter for general adult medical examination without abnormal findings: Secondary | ICD-10-CM | POA: Diagnosis not present

## 2022-04-24 DIAGNOSIS — I10 Essential (primary) hypertension: Secondary | ICD-10-CM | POA: Diagnosis not present

## 2022-04-24 DIAGNOSIS — Z79899 Other long term (current) drug therapy: Secondary | ICD-10-CM | POA: Diagnosis not present

## 2022-04-24 DIAGNOSIS — K219 Gastro-esophageal reflux disease without esophagitis: Secondary | ICD-10-CM | POA: Diagnosis not present

## 2022-04-24 DIAGNOSIS — I25119 Atherosclerotic heart disease of native coronary artery with unspecified angina pectoris: Secondary | ICD-10-CM | POA: Diagnosis not present

## 2022-04-24 DIAGNOSIS — E1169 Type 2 diabetes mellitus with other specified complication: Secondary | ICD-10-CM | POA: Diagnosis not present

## 2022-04-24 DIAGNOSIS — E7800 Pure hypercholesterolemia, unspecified: Secondary | ICD-10-CM | POA: Diagnosis not present

## 2022-05-02 ENCOUNTER — Other Ambulatory Visit (HOSPITAL_BASED_OUTPATIENT_CLINIC_OR_DEPARTMENT_OTHER): Payer: Self-pay

## 2022-05-02 MED ORDER — ACCU-CHEK FASTCLIX LANCETS MISC
3 refills | Status: AC
  Filled 2022-05-02: qty 102, 90d supply, fill #0

## 2022-08-01 DIAGNOSIS — Z8546 Personal history of malignant neoplasm of prostate: Secondary | ICD-10-CM | POA: Diagnosis not present

## 2022-08-08 DIAGNOSIS — Z8546 Personal history of malignant neoplasm of prostate: Secondary | ICD-10-CM | POA: Diagnosis not present

## 2022-08-08 DIAGNOSIS — N5231 Erectile dysfunction following radical prostatectomy: Secondary | ICD-10-CM | POA: Diagnosis not present

## 2022-08-08 DIAGNOSIS — N393 Stress incontinence (female) (male): Secondary | ICD-10-CM | POA: Diagnosis not present

## 2022-08-09 DIAGNOSIS — H43812 Vitreous degeneration, left eye: Secondary | ICD-10-CM | POA: Diagnosis not present

## 2022-08-09 DIAGNOSIS — E119 Type 2 diabetes mellitus without complications: Secondary | ICD-10-CM | POA: Diagnosis not present

## 2022-08-09 DIAGNOSIS — H2513 Age-related nuclear cataract, bilateral: Secondary | ICD-10-CM | POA: Diagnosis not present

## 2022-08-25 ENCOUNTER — Other Ambulatory Visit: Payer: Self-pay | Admitting: Cardiology

## 2022-08-31 ENCOUNTER — Other Ambulatory Visit: Payer: Self-pay | Admitting: Cardiology

## 2022-09-03 DIAGNOSIS — Z23 Encounter for immunization: Secondary | ICD-10-CM | POA: Diagnosis not present

## 2022-09-10 DIAGNOSIS — Z23 Encounter for immunization: Secondary | ICD-10-CM | POA: Diagnosis not present

## 2022-09-22 ENCOUNTER — Other Ambulatory Visit: Payer: Self-pay | Admitting: Cardiology

## 2022-10-09 DIAGNOSIS — C44712 Basal cell carcinoma of skin of right lower limb, including hip: Secondary | ICD-10-CM | POA: Diagnosis not present

## 2022-10-09 DIAGNOSIS — D692 Other nonthrombocytopenic purpura: Secondary | ICD-10-CM | POA: Diagnosis not present

## 2022-10-09 DIAGNOSIS — L821 Other seborrheic keratosis: Secondary | ICD-10-CM | POA: Diagnosis not present

## 2022-10-09 DIAGNOSIS — D1801 Hemangioma of skin and subcutaneous tissue: Secondary | ICD-10-CM | POA: Diagnosis not present

## 2022-10-09 DIAGNOSIS — L570 Actinic keratosis: Secondary | ICD-10-CM | POA: Diagnosis not present

## 2022-10-09 DIAGNOSIS — D485 Neoplasm of uncertain behavior of skin: Secondary | ICD-10-CM | POA: Diagnosis not present

## 2022-10-09 DIAGNOSIS — L918 Other hypertrophic disorders of the skin: Secondary | ICD-10-CM | POA: Diagnosis not present

## 2022-10-09 DIAGNOSIS — L814 Other melanin hyperpigmentation: Secondary | ICD-10-CM | POA: Diagnosis not present

## 2022-10-09 DIAGNOSIS — L720 Epidermal cyst: Secondary | ICD-10-CM | POA: Diagnosis not present

## 2022-10-25 DIAGNOSIS — E1169 Type 2 diabetes mellitus with other specified complication: Secondary | ICD-10-CM | POA: Diagnosis not present

## 2022-10-25 DIAGNOSIS — Z79899 Other long term (current) drug therapy: Secondary | ICD-10-CM | POA: Diagnosis not present

## 2022-10-25 DIAGNOSIS — E611 Iron deficiency: Secondary | ICD-10-CM | POA: Diagnosis not present

## 2022-10-25 DIAGNOSIS — E7800 Pure hypercholesterolemia, unspecified: Secondary | ICD-10-CM | POA: Diagnosis not present

## 2022-10-30 DIAGNOSIS — C44712 Basal cell carcinoma of skin of right lower limb, including hip: Secondary | ICD-10-CM | POA: Diagnosis not present

## 2022-11-12 ENCOUNTER — Ambulatory Visit
Admission: EM | Admit: 2022-11-12 | Discharge: 2022-11-12 | Disposition: A | Discharge status: Home / Self Care | Payer: Medicare Other | Attending: Urgent Care | Admitting: Urgent Care

## 2022-11-12 DIAGNOSIS — W19XXXA Unspecified fall, initial encounter: Secondary | ICD-10-CM

## 2022-11-12 DIAGNOSIS — S0181XA Laceration without foreign body of other part of head, initial encounter: Secondary | ICD-10-CM

## 2022-11-12 DIAGNOSIS — S50811A Abrasion of right forearm, initial encounter: Secondary | ICD-10-CM | POA: Diagnosis not present

## 2022-11-12 MED ORDER — BACITRACIN ZINC 500 UNIT/GM EX OINT: 1.0000 | TOPICAL_OINTMENT | Freq: Two times a day (BID) | CUTANEOUS | 0 refills | Status: AC

## 2022-11-12 NOTE — Discharge Instructions (Addendum)
WOUND CARE Please return in 10 days to have your stitches/staples removed or sooner if you have concerns.  Keep area clean and dry for 24 hours. Do not remove bandage, if applied.  After 24 hours, remove bandage and wash wound gently with mild soap and warm water. Reapply a new bandage after cleaning wound, if directed.  Continue daily cleansing with soap and water until stitches/staples are removed.  Do not apply any ointments or creams to the wound while stitches/staples are in place, as this may cause delayed healing.  Notify the office if you experience any of the following signs of infection: Swelling, redness, pus drainage, streaking, fever >101.0 F  Notify the office if you experience excessive bleeding that does not stop after 15-20 minutes of constant, firm pressure.   Forearm abrasion Change your dressing 2-3 times daily. Every time you change your dressing, clean the wound gently with warm water and Dial antibacterial soap. Pat the wound dry, let it breathe for roughly an hour before covering it back up. When you reapply a dressing, apply Bacitracin ointment to the wound, then cover with non-stick/non-adherent gauze.  After 4-7 days, the wound should scab over nicely and then you don't have to continue doing dressings.

## 2022-11-12 NOTE — ED Provider Notes (Signed)
[facility]RGENT [facility]  Note:  This document was prepared using Systems analyst and may include unintentional dictation errors.  MRN: [id number] DOB: 05-01-1946  Subjective:   Andrew Williamson is a 76 y.o. male presenting for suffering right face laceration and right forearm abrasion today.  Patient was bowling and excellently tripped and fell landing directly onto the bowling ball causing a laceration on his chin and the abrasion on his forearm.  Tdap is up-to-date to the best of his knowledge.  Patient did clean the wound and came to our clinic.  No current facility-administered medications for this encounter.  Current Outpatient Medications:    Accu-Chek FastClix Lancets MISC, Use as directed once daily, Disp: 102 each, Rfl: 3   cetirizine (ZYRTEC) 10 MG tablet, Take 10 mg by mouth as needed for allergies., Disp: , Rfl:    clopidogrel (PLAVIX) 75 MG tablet, TAKE 1 TABLET BY MOUTH EVERY DAY, Disp: 90 tablet, Rfl: 0   CONTOUR NEXT TEST test strip, USE TO TEST SUGAR ONCE A DAY, Disp: , Rfl:    lansoprazole (PREVACID) 30 MG capsule, Take 1 capsule by mouth daily., Disp: , Rfl: 5   lisinopril (PRINIVIL,ZESTRIL) 5 MG tablet, Take 1 tablet (5 mg total) by mouth daily., Disp: 30 tablet, Rfl: 1   metFORMIN (GLUCOPHAGE) 1000 MG tablet, Take 1,000 mg by mouth 2 (two) times daily with a meal., Disp: , Rfl:    Nirmatrelvir & Ritonavir (PAXLOVID) 20 x 150 MG & 10 x '100MG'$  TBPK, Take 2 tablets (1 nirmatrelvir and 1 ritonavir) by mouth twice daily for 5 days, Disp: 20 tablet, Rfl: 0   nitroGLYCERIN (NITROSTAT) 0.4 MG SL tablet, PLACE 1 TABLET (0.4 MG TOTAL) UNDER THE TONGUE EVERY 5 (FIVE) MINUTES AS NEEDED FOR CHEST PAIN., Disp: 25 tablet, Rfl: 11   ranolazine (RANEXA) 500 MG 12 hr tablet, Take 1 tablet (500 mg total) by mouth 2 (two) times daily. Please call our office to schedule an overdue appointment with Dr. Marlou Porch for future refills. [phone]. Thank you. 2nd  attempt, Disp: 30 tablet, Rfl: 0   rosuvastatin (CRESTOR) 20 MG tablet, Take 20 mg by mouth daily. , Disp: , Rfl:    Allergies  Allergen Reactions   Thimerosal (Thiomersal) Other (See Comments)    Red eyes     Past Medical History:  Diagnosis Date   Allergic rhinitis    CAD (coronary artery disease) 01/07/2017   a - Myoview 12/17:  EF 55, inf-lateral ischemia; intermediate risk // b - LHC 12/05/16: oLM 50, oLAD 40, dLAD 50, RI 25, oLCx 95, mLCx 95, RCA luminal irregs // c - Echo 12/17: EF 55-60, no RWMA, Gr 1 DD // d. S/p CABG 1/18 (L-LAD, S-RI, S-LCx) // NSTEMI 10.2019 -([place], [place]) - LHC: L-LAD ok, S-LCx and S-RI occluded; PCI: DES x 2 to RI branch of LCx; Echo: EF 60-65   Diabetes mellitus without complication ([place])    with microalbuminuria   DJD (degenerative joint disease)    hips   ED (erectile dysfunction)    GERD (gastroesophageal reflux disease)    Gynecomastia, male    History of echocardiogram    Echo 12/17: EF 55-60, normal wall motion, grade 1 diastolic dysfunction   History of nuclear stress test    Myoview 12/17: EF 55, inf-lateral ischemia; intermediate risk   HLD (hyperlipidemia)    hx of ? elevated LFTs on statin Rx   Hypertension    Obesity    Prostate  cancer [facility])    s/p radical prostatectomy   Spinal stenosis      Past Surgical History:  Procedure Laterality Date   CARDIAC CATHETERIZATION N/A 12/05/2016   Procedure: Left Heart Cath and Coronary Angiography;  Surgeon: Jettie Booze, MD;  Location: [facility];  Service: Cardiovascular;  Laterality: N/A;   CARDIAC CATHETERIZATION N/A 12/05/2016   Procedure: Intravascular Ultrasound/IVUS;  Surgeon: Jettie Booze, MD;  Location: [facility];  Service: Cardiovascular;  Laterality: N/A;   COLONOSCOPY  06/2014   FU 10 years   CORONARY ARTERY BYPASS GRAFT N/A 12/19/2016   Procedure: CORONARY ARTERY BYPASS GRAFTING (CABG) x three , using left internal mammary artery and right leg  greater saphenous vein harvested endoscopically; SVG to Ramus, SVG to circ, LIMA to LAD ;  Surgeon: [name], MD;  Location: [facility];  Service: Open Heart Surgery;  Laterality: N/A;   LAPAROSCOPIC CHOLECYSTECTOMY     2012   ROBOT ASSISTED LAPAROSCOPIC RADICAL PROSTATECTOMY N/A 08/11/2015   Procedure: ROBOTIC ASSISTED LAPAROSCOPIC RADICAL PROSTATECTOMY;  Surgeon: Irine Seal, MD;  [facility];  Service: Urology;  Laterality: N/A;   TEE WITHOUT CARDIOVERSION N/A 12/19/2016   Procedure: TRANSESOPHAGEAL ECHOCARDIOGRAM (TEE);  Surgeon: [name], MD;  Location: [facility];  Service: Open Heart Surgery;  Laterality: N/A;    Family History  Problem Relation Age of Onset   Heart attack Mother 71   Heart failure Father 85       cardiac arrest during surgery >> CHF   Peripheral Artery Disease Father    Heart attack Maternal Uncle     Social History   Tobacco Use   Smoking status: Never   Smokeless tobacco: Former    Types: Nurse, children's Use: Never used  Substance Use Topics   Alcohol use: Yes    Comment: rarely   Drug use: No    ROS   Objective:   Vitals: BP (!) 163/86 (BP Location: Left Arm)   Pulse 97   Temp 98.4 F (36.9 C) (Oral)   Resp 15   SpO2 96%   Physical Exam Constitutional:      General: He is not in acute distress.    Appearance: Normal appearance. He is well-developed and normal weight. He is not ill-appearing, toxic-appearing or diaphoretic.  HENT:     Head: Normocephalic and atraumatic.      Right Ear: Tympanic membrane, ear canal and external ear normal. No drainage, swelling or tenderness. No middle ear effusion. There is no impacted cerumen. Tympanic membrane is not erythematous or bulging.     Left Ear: Tympanic membrane, ear canal and external ear normal. No drainage, swelling or tenderness.  No middle ear effusion. There is no impacted cerumen. Tympanic membrane is not erythematous or bulging.     Nose: Nose normal. No  congestion or rhinorrhea.     Mouth/Throat:     Mouth: Mucous membranes are moist.     Pharynx: Oropharynx is clear. No oropharyngeal exudate or posterior oropharyngeal erythema.  Eyes:     General: No scleral icterus.       Right eye: No discharge.        Left eye: No discharge.     Extraocular Movements: Extraocular movements intact.     Conjunctiva/sclera: Conjunctivae normal.  Cardiovascular:     Rate and Rhythm: Normal rate.  Pulmonary:     Effort: Pulmonary effort is normal.  Musculoskeletal:  Cervical back: Normal range of motion and neck supple. No rigidity. No muscular tenderness.  Skin:      Neurological:     General: No focal deficit present.     Mental Status: He is alert and oriented to person, place, and time.     Cranial Nerves: No cranial nerve deficit.     Motor: No weakness.     Coordination: Coordination normal.     Gait: Gait normal.     Deep Tendon Reflexes: Reflexes normal.  Psychiatric:        Mood and Affect: Mood normal.        Behavior: Behavior normal.        Thought Content: Thought content normal.        Judgment: Judgment normal.     PROCEDURE NOTE: laceration repair Verbal consent obtained from patient.  Local anesthesia with 10cc Lidocaine 2% with epinephrine.  Wound explored for tendon, ligament damage. Wound scrubbed with soap and water and rinsed. Wound closed with #4 4-0 Ethilon (simple interrupted) sutures.  Wound cleansed and dressed.  Dressing applied to the right forearm abrasion.  Assessment and Plan :   PDMP not reviewed this encounter.  1. Chin laceration, initial encounter   2. Accidental fall, initial encounter   3. Abrasion of right forearm, initial encounter     Laceration repaired successfully. Wound care reviewed. Recommended Tylenol and/or ibuprofen for pain control. Return-to-clinic precautions discussed, patient verbalized understanding. Otherwise, follow up in 10 days for suture removal. Counseled patient on  potential for adverse effects with medications prescribed/recommended today, ER and return-to-clinic precautions discussed, patient verbalized understanding.    Jaynee Eagles, [place] 11/12/22 [phone]

## 2022-11-12 NOTE — ED Triage Notes (Signed)
Pt reports he tripped/fell ~30 min PTA onto bowling bowl-laceration below right side chin, abrasion to right forearm-no LOC-NAD-steady gait

## 2022-11-13 ENCOUNTER — Ambulatory Visit: Payer: Medicare Other | Attending: Cardiology | Admitting: Cardiology

## 2022-11-13 ENCOUNTER — Encounter: Payer: Self-pay | Admitting: Cardiology

## 2022-11-13 VITALS — BP 122/62 | HR 94 | Ht 69.0 in | Wt 197.0 lb

## 2022-11-13 DIAGNOSIS — E782 Mixed hyperlipidemia: Secondary | ICD-10-CM | POA: Diagnosis not present

## 2022-11-13 DIAGNOSIS — Z951 Presence of aortocoronary bypass graft: Secondary | ICD-10-CM | POA: Insufficient documentation

## 2022-11-13 DIAGNOSIS — I25119 Atherosclerotic heart disease of native coronary artery with unspecified angina pectoris: Secondary | ICD-10-CM | POA: Diagnosis not present

## 2022-11-13 NOTE — Progress Notes (Signed)
Cardiology Office Note:    Date:  11/13/2022   ID:  Andrew Williamson, DOB 03-09-1946, MRN [id number]  PCP:  Lujean Amel, MD   [facility] Providers Cardiologist:  Candee Furbish, MD     Referring MD: Lujean Amel, MD    History of Present Illness:    Andrew Williamson is a 76 y.o. male here for the follow up of CAD post CABG 2018  Had NSTEMI 2019 in [place]. OM stented SVG Grafts were down LIMA patent.   Isosorbide HA. Still having light HA.   Less energy. No CP, no SOB.   Widowed retired Insurance underwriter.  Past Medical History:  Diagnosis Date   Allergic rhinitis    CAD (coronary artery disease) 01/07/2017   a - Myoview 12/17:  EF 55, inf-lateral ischemia; intermediate risk // b - LHC 12/05/16: oLM 50, oLAD 40, dLAD 50, RI 25, oLCx 95, mLCx 95, RCA luminal irregs // c - Echo 12/17: EF 55-60, no RWMA, Gr 1 DD // d. S/p CABG 1/18 (L-LAD, S-RI, S-LCx) // NSTEMI 10.2019 -([place], [place]) - LHC: L-LAD ok, S-LCx and S-RI occluded; PCI: DES x 2 to RI branch of LCx; Echo: EF 60-65   Diabetes mellitus without complication ([place])    with microalbuminuria   DJD (degenerative joint disease)    hips   ED (erectile dysfunction)    GERD (gastroesophageal reflux disease)    Gynecomastia, male    History of echocardiogram    Echo 12/17: EF 55-60, normal wall motion, grade 1 diastolic dysfunction   History of nuclear stress test    Myoview 12/17: EF 55, inf-lateral ischemia; intermediate risk   HLD (hyperlipidemia)    hx of ? elevated LFTs on statin Rx   Hypertension    Obesity    Prostate cancer [facility]    s/p radical prostatectomy   Spinal stenosis     Past Surgical History:  Procedure Laterality Date   CARDIAC CATHETERIZATION N/A 12/05/2016   Procedure: Left Heart Cath and Coronary Angiography;  Surgeon: Jettie Booze, MD;  Location: [facility];  Service: Cardiovascular;  Laterality: N/A;   CARDIAC CATHETERIZATION N/A 12/05/2016   Procedure:  Intravascular Ultrasound/IVUS;  Surgeon: Jettie Booze, MD;  Location: [place] [facility];  Service: Cardiovascular;  Laterality: N/A;   COLONOSCOPY  06/2014   FU 10 years   CORONARY ARTERY BYPASS GRAFT N/A 12/19/2016   Procedure: CORONARY ARTERY BYPASS GRAFTING (CABG) x three , using left internal mammary artery and right leg greater saphenous vein harvested endoscopically; SVG to Ramus, SVG to circ, LIMA to LAD ;  Surgeon: [name], MD;  Location: [facility];  Service: Open Heart Surgery;  Laterality: N/A;   LAPAROSCOPIC CHOLECYSTECTOMY     2012   ROBOT ASSISTED LAPAROSCOPIC RADICAL PROSTATECTOMY N/A 08/11/2015   Procedure: ROBOTIC ASSISTED LAPAROSCOPIC RADICAL PROSTATECTOMY;  Surgeon: Irine Seal, MD;  [facility];  Service: Urology;  Laterality: N/A;   TEE WITHOUT CARDIOVERSION N/A 12/19/2016   Procedure: TRANSESOPHAGEAL ECHOCARDIOGRAM (TEE);  Surgeon: [name], MD;  Location: [facility];  Service: Open Heart Surgery;  Laterality: N/A;    Current Medications: Current Meds  Medication Sig   Accu-Chek FastClix Lancets MISC Use as directed once daily   bacitracin ointment Apply 1 Application topically 2 (two) times daily.   clopidogrel (PLAVIX) 75 MG tablet TAKE 1 TABLET BY MOUTH EVERY DAY   CONTOUR NEXT TEST test strip USE TO TEST SUGAR ONCE A DAY  lansoprazole (PREVACID) 30 MG capsule Take 1 capsule by mouth daily.   lisinopril (PRINIVIL,ZESTRIL) 5 MG tablet Take 1 tablet (5 mg total) by mouth daily.   metFORMIN (GLUCOPHAGE) 1000 MG tablet Take 1,000 mg by mouth 2 (two) times daily with a meal.   Multiple Vitamin (MULTIVITAMIN) capsule Take 1 capsule by mouth daily.   ranolazine (RANEXA) 500 MG 12 hr tablet Take 1 tablet (500 mg total) by mouth 2 (two) times daily. Please call our office to schedule an overdue appointment with Dr. Marlou Porch for future refills. [phone]. Thank you. 2nd attempt   rosuvastatin (CRESTOR) 20 MG tablet Take 20 mg by mouth daily.       Allergies:   Thimerosal (thiomersal)   Social History   Socioeconomic History   Marital status: Widowed    Spouse name: Not on file   Number of children: Not on file   Years of education: Not on file   Highest education level: Not on file  Occupational History   Not on file  Tobacco Use   Smoking status: Never   Smokeless tobacco: Former    Types: Nurse, children's Use: Never used  Substance and Sexual Activity   Alcohol use: Yes    Comment: rarely   Drug use: No   Sexual activity: Not on file  Other Topics Concern   Not on file  Social History Narrative   Widower wife died at 20 of breast CA   Children: 2 step daughters   Retired - Insurance underwriter   Native of [place] and 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  ROS:   Please see the history of present illness.     All other systems reviewed and are negative.  EKGs/Labs/Other Studies Reviewed:    The following studies were reviewed today: Cath 2019 at [facility] reviewed in Chart. OM stent.   EKG:  Today - SR 91 no other changes, priorSR 83 bpm Prior NSR  Recent Labs: No results found for requested labs within last 365 days.  Recent Lipid Panel No results found for: "CHOL", "TRIG", "HDL", "CHOLHDL", "VLDL", "LDLCALC", "LDLDIRECT"   Risk Assessment/Calculations:          Physical Exam:    VS:  BP 122/62   Pulse 94   Ht '5\' 9"'$  (1.753 m)   Wt 197 lb (89.4 kg)   SpO2 97%   BMI 29.09  kg/m     Wt Readings from Last 3 Encounters:  11/13/22 197 lb (89.4 kg)  06/26/21 198 lb (89.8 kg)  10/28/20 199 lb (90.3 kg)     GEN:  Well nourished, well developed in no acute distress HEENT: Normal NECK: No JVD; No carotid bruits LYMPHATICS: No lymphadenopathy CARDIAC: RRR, no murmurs, rubs, gallops RESPIRATORY:  Clear to auscultation without rales, wheezing or rhonchi  ABDOMEN: Soft, non-tender, non-distended MUSCULOSKELETAL:  No edema; No deformity  SKIN: Warm and dry NEUROLOGIC:  Alert and  oriented x 3 PSYCHIATRIC:  Normal affect   ASSESSMENT:    1. Coronary artery disease involving native coronary artery of native heart with angina pectoris ([place])   2. S/P CABG x 3   3. Mixed hyperlipidemia    PLAN:    In order of problems listed above:  CAD post CABG -- Both SVG grafts are down, LIMA is patent 2019 cath in [place].  --OM stent in 2019 in setting of MI. Dr. Juanda Chance. No angina since.  -- Plavix - bruising. No ASA  Hyperlipidemia --Continue with medical management with Crestor '20mg'$  PO QD. No myalgias. LDL 04/2022 outside labs 50  Angina --Has not had any chest pain since 2019 stent --HA with long acting nitrates.  --Metoprolol '25mg'$  BID stopped. Still somewhat decreased energy. Has not been walking as much. His friend has a new border collie. He is walking it.  --OK with stopping or trying to stop Ranexa. He will message if no symptoms.   ED --now off metoprolol  COVID in 5/22 --took Paxlovid. Sinus congestion. No HA.   CKD 3a - Creat 1.3 outside labs. Avoid NSAIDS       Medication Adjustments/Labs and Tests Ordered: Current medicines are reviewed at length with the patient today.  Concerns regarding medicines are outlined above.  Orders Placed This Encounter  Procedures   EKG 12-Lead    No orders of the defined types were placed in this encounter.    Patient Instructions  Medication Instructions:  Your physician recommends that you  continue on your current medications as directed. Please refer to the Current Medication list given to you today.  ** Continue Ranolazine as discussed with Dr Marlou Porch and send a MyChart message or call if needed.  *If you need a refill on your cardiac medications before your next appointment, please call your pharmacy*   Lab Work: None ordered.  If you have labs (blood work) drawn today and your tests are completely normal, you will receive your results only by: [place] (if you have MyChart) OR A paper copy in the mail If you have any lab test that is abnormal or we need to change your treatment, we will call you to review the results.   Testing/Procedures: None ordered.    Follow-Up: At [facility], you and your health needs are our priority.  As part of our continuing mission to provide you with exceptional heart care, we have created designated Provider Care Teams.  These Care Teams include your primary Cardiologist (physician) and Advanced Practice Providers (APPs -  Physician Assistants and Nurse Practitioners) who all work together to provide you with the care you need, when you need it.  We recommend signing up for the patient portal called "MyChart".  Sign up information is provided on this After Visit Summary.  MyChart is used to connect with patients for Virtual Visits (Telemedicine).  Patients are able to view lab/test results, encounter notes, upcoming appointments, etc.  Non-urgent messages can be sent to your provider as well.   To learn more about what you can do with MyChart, go to [email or web address].    Your next appointment:   12 months with Dr Marlou Porch  Important Information About Sugar         Signed, Candee Furbish, MD  11/13/2022 10:14 AM    [place]

## 2022-11-13 NOTE — Patient Instructions (Signed)
Medication Instructions:  Your physician recommends that you continue on your current medications as directed. Please refer to the Current Medication list given to you today.  ** Continue Ranolazine as discussed with Dr Marlou Porch and send a MyChart message or call if needed.  *If you need a refill on your cardiac medications before your next appointment, please call your pharmacy*   Lab Work: None ordered.  If you have labs (blood work) drawn today and your tests are completely normal, you will receive your results only by: [place] (if you have MyChart) OR A paper copy in the mail If you have any lab test that is abnormal or we need to change your treatment, we will call you to review the results.   Testing/Procedures: None ordered.    Follow-Up: At [facility], you and your health needs are our priority.  As part of our continuing mission to provide you with exceptional heart care, we have created designated Provider Care Teams.  These Care Teams include your primary Cardiologist (physician) and Advanced Practice Providers (APPs -  Physician Assistants and Nurse Practitioners) who all work together to provide you with the care you need, when you need it.  We recommend signing up for the patient portal called "MyChart".  Sign up information is provided on this After Visit Summary.  MyChart is used to connect with patients for Virtual Visits (Telemedicine).  Patients are able to view lab/test results, encounter notes, upcoming appointments, etc.  Non-urgent messages can be sent to your provider as well.   To learn more about what you can do with MyChart, go to [email or web address].    Your next appointment:   12 months with Dr Marlou Porch  Important Information About Sugar

## 2022-11-15 ENCOUNTER — Encounter: Payer: Self-pay | Admitting: Cardiology

## 2022-11-16 DIAGNOSIS — U071 COVID-19: Secondary | ICD-10-CM | POA: Diagnosis not present

## 2022-11-16 DIAGNOSIS — N189 Chronic kidney disease, unspecified: Secondary | ICD-10-CM | POA: Diagnosis not present

## 2022-11-16 DIAGNOSIS — J3489 Other specified disorders of nose and nasal sinuses: Secondary | ICD-10-CM | POA: Diagnosis not present

## 2022-11-22 ENCOUNTER — Ambulatory Visit
Admission: EM | Admit: 2022-11-22 | Discharge: 2022-11-22 | Disposition: A | Discharge status: Home / Self Care | Payer: Medicare Other | Attending: Internal Medicine | Admitting: Internal Medicine

## 2022-11-22 DIAGNOSIS — S0181XA Laceration without foreign body of other part of head, initial encounter: Secondary | ICD-10-CM | POA: Diagnosis not present

## 2022-12-19 ENCOUNTER — Other Ambulatory Visit: Payer: Self-pay | Admitting: Cardiology

## 2023-05-15 DIAGNOSIS — E611 Iron deficiency: Secondary | ICD-10-CM | POA: Diagnosis not present

## 2023-05-15 DIAGNOSIS — Z79899 Other long term (current) drug therapy: Secondary | ICD-10-CM | POA: Diagnosis not present

## 2023-05-15 DIAGNOSIS — R251 Tremor, unspecified: Secondary | ICD-10-CM | POA: Diagnosis not present

## 2023-05-15 DIAGNOSIS — I10 Essential (primary) hypertension: Secondary | ICD-10-CM | POA: Diagnosis not present

## 2023-05-15 DIAGNOSIS — E113292 Type 2 diabetes mellitus with mild nonproliferative diabetic retinopathy without macular edema, left eye: Secondary | ICD-10-CM | POA: Diagnosis not present

## 2023-05-15 DIAGNOSIS — K219 Gastro-esophageal reflux disease without esophagitis: Secondary | ICD-10-CM | POA: Diagnosis not present

## 2023-05-15 DIAGNOSIS — Z0001 Encounter for general adult medical examination with abnormal findings: Secondary | ICD-10-CM | POA: Diagnosis not present

## 2023-05-15 DIAGNOSIS — E1169 Type 2 diabetes mellitus with other specified complication: Secondary | ICD-10-CM | POA: Diagnosis not present

## 2023-05-15 DIAGNOSIS — E7800 Pure hypercholesterolemia, unspecified: Secondary | ICD-10-CM | POA: Diagnosis not present

## 2023-05-15 DIAGNOSIS — I25119 Atherosclerotic heart disease of native coronary artery with unspecified angina pectoris: Secondary | ICD-10-CM | POA: Diagnosis not present

## 2023-07-23 DIAGNOSIS — Z8546 Personal history of malignant neoplasm of prostate: Secondary | ICD-10-CM | POA: Diagnosis not present

## 2023-07-29 DIAGNOSIS — N5231 Erectile dysfunction following radical prostatectomy: Secondary | ICD-10-CM | POA: Diagnosis not present

## 2023-07-29 DIAGNOSIS — N393 Stress incontinence (female) (male): Secondary | ICD-10-CM | POA: Diagnosis not present

## 2023-07-29 DIAGNOSIS — Z8546 Personal history of malignant neoplasm of prostate: Secondary | ICD-10-CM | POA: Diagnosis not present

## 2023-09-13 DIAGNOSIS — Z23 Encounter for immunization: Secondary | ICD-10-CM | POA: Diagnosis not present

## 2023-09-17 DIAGNOSIS — H2513 Age-related nuclear cataract, bilateral: Secondary | ICD-10-CM | POA: Diagnosis not present

## 2023-09-17 DIAGNOSIS — H25012 Cortical age-related cataract, left eye: Secondary | ICD-10-CM | POA: Diagnosis not present

## 2023-09-17 DIAGNOSIS — E119 Type 2 diabetes mellitus without complications: Secondary | ICD-10-CM | POA: Diagnosis not present

## 2023-09-17 DIAGNOSIS — H43812 Vitreous degeneration, left eye: Secondary | ICD-10-CM | POA: Diagnosis not present

## 2023-11-11 NOTE — Progress Notes (Unsigned)
Cardiology Office Note:  .   Date:  11/12/2023  ID:  Andrew Williamson, DOB 1946/02/09, MRN [id number] PCP: Darrow Bussing, MD  [facility] Providers Cardiologist:  Donato Schultz, MD {  History of Present Illness: .   Andrew Williamson is a 77 y.o. male with a past medical history of CAD status post CABG in 2018 (I talked to him on the phone after his CABG and was scrubbed on his surgery).  Had an NSTEMI in 2019 in [place] and his OM was stented, SVG grafts are down and LIMA was patent.  Started on isosorbide and unfortunately obtain a headache.  Still having a light headache.  When he was seen last year he was having trouble with less energy but no chest pain or shortness of breath.  He is a widowed retired Special educational needs teacher.  Today, he has a history of CABG in 2018 and stent placement in 2019, presents for a routine follow-up. He reports no current chest pain or shortness of breath and has not needed to use nitroglycerin. The patient's exercise tolerance has improved since the stent placement. He is currently on Plavix and Lisinopril. The patient also mentions being prediabetic and acknowledges not adhering strictly to his diet. He has enjoyed spending time with his male companion whom he used to square dance with.   Reports no shortness of breath nor dyspnea on exertion. Reports no chest pain, pressure, or tightness. No edema, orthopnea, PND. Reports no palpitations.    Discussed the use of AI scribe software for clinical note transcription with the patient, who gave verbal consent to proceed.  ROS: Pertinent ROS in HPI  Studies Reviewed: Marland Kitchen   EKG Interpretation Date/Time:  Tuesday November 12 2023 10:45:27 EST Ventricular Rate:  80 PR Interval:  182 QRS Duration:  90 QT Interval:  374 QTC Calculation: 431 R Axis:   52  Text Interpretation: Normal sinus rhythm Normal ECG When compared with ECG of 05-Jan-2019 15:30, No significant change was found Confirmed by Jari Favre  [phone]) on 11/12/2023 11:00:21 AM    Reviewed scanned in cardiac cath report from 02/17/2019      Physical Exam:   VS:  BP 136/80   Pulse 80   Ht 5\' 9"  (1.753 m)   Wt 194 lb 9.6 oz (88.3 kg)   SpO2 100%   BMI 28.74 kg/m    Wt Readings from Last 3 Encounters:  11/12/23 194 lb 9.6 oz (88.3 kg)  11/13/22 197 lb (89.4 kg)  06/26/21 198 lb (89.8 kg)    GEN: Well nourished, well developed in no acute distress NECK: No JVD; No carotid bruits CARDIAC: RRR, no murmurs, rubs, gallops RESPIRATORY:  Clear to auscultation without rales, wheezing or rhonchi  ABDOMEN: Soft, non-tender, non-distended EXTREMITIES:  No edema; No deformity   ASSESSMENT AND PLAN: .    Hyperlipidemia -Recent LDL 45, at goal   -Continue Crestor 20 mg daily  Coronary Artery Disease s/p CABG History of CABG in 2018 with subsequent vein graft failure and stent placement in 2019. No current symptoms of chest pain or shortness of breath. Stable on Plavix 75mg  and Lisinopril 5mg  daily. -Continue current medications. -Encouraged to keep nitroglycerin on hand despite not needing it recently. -Continue regular exercise as tolerated.  Pre-diabetes Last A1C 6.6, indicating borderline diabetes. -Encouraged to maintain a healthy diet and regular exercise. -Follow up with primary care for regular monitoring of A1C.  General Health Maintenance -Continue regular follow-ups with cardiology annually or as  needed. -Plan to follow up with primary care for routine health maintenance and monitoring of pre-diabetes.   Dispo: He can follow-up in a year with me or Dr. Anne Fu  Signed, Sharlene Dory, PA-C

## 2023-11-12 ENCOUNTER — Encounter: Payer: Self-pay | Admitting: Physician Assistant

## 2023-11-12 ENCOUNTER — Ambulatory Visit: Payer: Medicare Other | Attending: Physician Assistant | Admitting: Physician Assistant

## 2023-11-12 VITALS — BP 136/80 | HR 80 | Ht 69.0 in | Wt 194.6 lb

## 2023-11-12 DIAGNOSIS — I2510 Atherosclerotic heart disease of native coronary artery without angina pectoris: Secondary | ICD-10-CM | POA: Diagnosis not present

## 2023-11-12 DIAGNOSIS — I25119 Atherosclerotic heart disease of native coronary artery with unspecified angina pectoris: Secondary | ICD-10-CM | POA: Diagnosis not present

## 2023-11-12 DIAGNOSIS — Z951 Presence of aortocoronary bypass graft: Secondary | ICD-10-CM | POA: Insufficient documentation

## 2023-11-12 DIAGNOSIS — I209 Angina pectoris, unspecified: Secondary | ICD-10-CM | POA: Insufficient documentation

## 2023-11-12 DIAGNOSIS — E782 Mixed hyperlipidemia: Secondary | ICD-10-CM | POA: Insufficient documentation

## 2023-11-12 MED ORDER — NITROGLYCERIN 0.4 MG SL SUBL: 0.4000 mg | SUBLINGUAL_TABLET | SUBLINGUAL | 3 refills | Status: AC | PRN

## 2023-11-12 NOTE — Patient Instructions (Signed)
 Medication Instructions:  Your physician recommends that you continue on your current medications as directed. Please refer to the Current Medication list given to you today.  *If you need a refill on your cardiac medications before your next appointment, please call your pharmacy*   Lab Work: None ordered  If you have labs (blood work) drawn today and your tests are completely normal, you will receive your results only by: MyChart Message (if you have MyChart) OR A paper copy in the mail If you have any lab test that is abnormal or we need to change your treatment, we will call you to review the results.   Testing/Procedures: None ordered   Follow-Up: At [facility], you and your health needs are our priority.  As part of our continuing mission to provide you with exceptional heart care, we have created designated Provider Care Teams.  These Care Teams include your primary Cardiologist (physician) and Advanced Practice Providers (APPs -  Physician Assistants and Nurse Practitioners) who all work together to provide you with the care you need, when you need it.  We recommend signing up for the patient portal called "MyChart".  Sign up information is provided on this After Visit Summary.  MyChart is used to connect with patients for Virtual Visits (Telemedicine).  Patients are able to view lab/test results, encounter notes, upcoming appointments, etc.  Non-urgent messages can be sent to your provider as well.   To learn more about what you can do with MyChart, go to [email or web address].    Your next appointment:   12 month(s)  Provider:   Donato Schultz, MD     Other Instructions

## 2023-11-19 DIAGNOSIS — Z8546 Personal history of malignant neoplasm of prostate: Secondary | ICD-10-CM | POA: Diagnosis not present

## 2023-11-19 DIAGNOSIS — E1169 Type 2 diabetes mellitus with other specified complication: Secondary | ICD-10-CM | POA: Diagnosis not present

## 2023-11-19 DIAGNOSIS — E7800 Pure hypercholesterolemia, unspecified: Secondary | ICD-10-CM | POA: Diagnosis not present

## 2023-11-19 DIAGNOSIS — Z79899 Other long term (current) drug therapy: Secondary | ICD-10-CM | POA: Diagnosis not present

## 2023-11-26 DIAGNOSIS — Z85828 Personal history of other malignant neoplasm of skin: Secondary | ICD-10-CM | POA: Diagnosis not present

## 2023-11-26 DIAGNOSIS — D692 Other nonthrombocytopenic purpura: Secondary | ICD-10-CM | POA: Diagnosis not present

## 2023-11-26 DIAGNOSIS — D1801 Hemangioma of skin and subcutaneous tissue: Secondary | ICD-10-CM | POA: Diagnosis not present

## 2023-11-26 DIAGNOSIS — L821 Other seborrheic keratosis: Secondary | ICD-10-CM | POA: Diagnosis not present

## 2023-11-26 DIAGNOSIS — L570 Actinic keratosis: Secondary | ICD-10-CM | POA: Diagnosis not present

## 2023-11-26 DIAGNOSIS — D224 Melanocytic nevi of scalp and neck: Secondary | ICD-10-CM | POA: Diagnosis not present

## 2023-11-26 DIAGNOSIS — L300 Nummular dermatitis: Secondary | ICD-10-CM | POA: Diagnosis not present

## 2023-11-26 DIAGNOSIS — L720 Epidermal cyst: Secondary | ICD-10-CM | POA: Diagnosis not present

## 2023-11-26 DIAGNOSIS — L814 Other melanin hyperpigmentation: Secondary | ICD-10-CM | POA: Diagnosis not present

## 2024-01-02 ENCOUNTER — Other Ambulatory Visit: Payer: Self-pay | Admitting: Cardiology

## 2024-01-07 DIAGNOSIS — U071 COVID-19: Secondary | ICD-10-CM | POA: Diagnosis not present

## 2024-02-25 DIAGNOSIS — E113292 Type 2 diabetes mellitus with mild nonproliferative diabetic retinopathy without macular edema, left eye: Secondary | ICD-10-CM | POA: Diagnosis not present

## 2024-05-12 DIAGNOSIS — R251 Tremor, unspecified: Secondary | ICD-10-CM | POA: Diagnosis not present

## 2024-05-12 DIAGNOSIS — Z1211 Encounter for screening for malignant neoplasm of colon: Secondary | ICD-10-CM | POA: Diagnosis not present

## 2024-05-12 DIAGNOSIS — Z79899 Other long term (current) drug therapy: Secondary | ICD-10-CM | POA: Diagnosis not present

## 2024-05-12 DIAGNOSIS — E7800 Pure hypercholesterolemia, unspecified: Secondary | ICD-10-CM | POA: Diagnosis not present

## 2024-05-12 DIAGNOSIS — Z8546 Personal history of malignant neoplasm of prostate: Secondary | ICD-10-CM | POA: Diagnosis not present

## 2024-05-12 DIAGNOSIS — K219 Gastro-esophageal reflux disease without esophagitis: Secondary | ICD-10-CM | POA: Diagnosis not present

## 2024-05-12 DIAGNOSIS — Z0000 Encounter for general adult medical examination without abnormal findings: Secondary | ICD-10-CM | POA: Diagnosis not present

## 2024-05-12 DIAGNOSIS — E113292 Type 2 diabetes mellitus with mild nonproliferative diabetic retinopathy without macular edema, left eye: Secondary | ICD-10-CM | POA: Diagnosis not present

## 2024-05-12 DIAGNOSIS — I10 Essential (primary) hypertension: Secondary | ICD-10-CM | POA: Diagnosis not present

## 2024-05-12 DIAGNOSIS — E611 Iron deficiency: Secondary | ICD-10-CM | POA: Diagnosis not present

## 2024-05-12 DIAGNOSIS — I25119 Atherosclerotic heart disease of native coronary artery with unspecified angina pectoris: Secondary | ICD-10-CM | POA: Diagnosis not present

## 2024-05-26 DIAGNOSIS — E611 Iron deficiency: Secondary | ICD-10-CM | POA: Diagnosis not present

## 2024-05-26 DIAGNOSIS — E113292 Type 2 diabetes mellitus with mild nonproliferative diabetic retinopathy without macular edema, left eye: Secondary | ICD-10-CM | POA: Diagnosis not present

## 2024-05-26 DIAGNOSIS — E7800 Pure hypercholesterolemia, unspecified: Secondary | ICD-10-CM | POA: Diagnosis not present

## 2024-05-26 DIAGNOSIS — Z79899 Other long term (current) drug therapy: Secondary | ICD-10-CM | POA: Diagnosis not present

## 2024-05-26 DIAGNOSIS — Z8546 Personal history of malignant neoplasm of prostate: Secondary | ICD-10-CM | POA: Diagnosis not present

## 2024-06-01 DIAGNOSIS — J019 Acute sinusitis, unspecified: Secondary | ICD-10-CM | POA: Diagnosis not present

## 2024-06-01 DIAGNOSIS — R051 Acute cough: Secondary | ICD-10-CM | POA: Diagnosis not present

## 2024-06-04 ENCOUNTER — Telehealth: Payer: Self-pay

## 2024-06-04 DIAGNOSIS — Z1211 Encounter for screening for malignant neoplasm of colon: Secondary | ICD-10-CM | POA: Diagnosis not present

## 2024-06-04 DIAGNOSIS — I2510 Atherosclerotic heart disease of native coronary artery without angina pectoris: Secondary | ICD-10-CM | POA: Diagnosis not present

## 2024-06-04 DIAGNOSIS — I252 Old myocardial infarction: Secondary | ICD-10-CM | POA: Diagnosis not present

## 2024-06-04 DIAGNOSIS — Z7901 Long term (current) use of anticoagulants: Secondary | ICD-10-CM | POA: Diagnosis not present

## 2024-06-04 NOTE — Telephone Encounter (Signed)
   Pre-operative Risk Assessment    Patient Name: Andrew Williamson  DOB: 1946-05-05 MRN: [id number]   Date of last office visit: 11/12/23 ORREN FABRY, PA-C Date of next office visit: NONE   Request for Surgical Clearance    Procedure:  COLONOSCOPY  Date of Surgery:  Clearance 07/07/24                                Surgeon:  DR JERRELL SOL Surgeon's Group or Practice Name:  [facility] Phone number:  [phone] Fax number:  [phone]   Type of Clearance Requested:   - Medical  - Pharmacy:  Hold Clopidogrel  (Plavix ) 5 DAYS BEFORE THE PROCEDURE   Type of Anesthesia:  PROPOFOL    Additional requests/questions:    Signed, Lucie DELENA Ku   06/04/2024, 3:00 PM

## 2024-06-05 ENCOUNTER — Telehealth: Payer: Self-pay

## 2024-06-05 ENCOUNTER — Other Ambulatory Visit: Payer: Self-pay

## 2024-06-05 NOTE — Telephone Encounter (Signed)
 Patient has been scheduled

## 2024-06-05 NOTE — Telephone Encounter (Signed)
   Name: Andrew Williamson  DOB: 1946/09/14  MRN: [id number]  Primary Cardiologist: Oneil Parchment, MD   Preoperative team, please contact this patient and set up a phone call appointment for further preoperative risk assessment. Please obtain consent and complete medication review. Last seen by Orren Fabry on 11/12/2023  Thank you for your help.  I confirm that guidance regarding antiplatelet and oral anticoagulation therapy has been completed and, if necessary, noted below.  Per office protocol, if patient is without any new symptoms or concerns at the time of their virtual visit, he may hold Plavix  for 5 days prior to procedure. Please resume Plavix  as soon as possible postprocedure, at the discretion of the surgeon.    I also confirmed the patient resides in the state of [place] . As per [facility] Medical Board telemedicine laws, the patient must reside in the state in which the provider is licensed.   Lamarr Satterfield, NP 06/05/2024, 12:14 PM [facility]

## 2024-06-05 NOTE — Telephone Encounter (Signed)
 Patient has been scheduled med rec and consent done     Patient Consent for Virtual Visit         Andrew Williamson has provided verbal consent on 06/05/2024 for a virtual visit (video or telephone).   CONSENT FOR VIRTUAL VISIT FOR:  Andrew Williamson  By participating in this virtual visit I agree to the following:  I hereby voluntarily request, consent and authorize [facility] and its employed or contracted physicians, physician assistants, nurse practitioners or other licensed health care professionals (the Practitioner), to provide me with telemedicine health care services (the "Services) as deemed necessary by the treating Practitioner. I acknowledge and consent to receive the Services by the Practitioner via telemedicine. I understand that the telemedicine visit will involve communicating with the Practitioner through live audiovisual communication technology and the disclosure of certain medical information by electronic transmission. I acknowledge that I have been given the opportunity to request an in-person assessment or other available alternative prior to the telemedicine visit and am voluntarily participating in the telemedicine visit.  I understand that I have the right to withhold or withdraw my consent to the use of telemedicine in the course of my care at any time, without affecting my right to future care or treatment, and that the Practitioner or I may terminate the telemedicine visit at any time. I understand that I have the right to inspect all information obtained and/or recorded in the course of the telemedicine visit and may receive copies of available information for a reasonable fee.  I understand that some of the potential risks of receiving the Services via telemedicine include:  Delay or interruption in medical evaluation due to technological equipment failure or disruption; Information transmitted may not be sufficient (e.g. poor resolution of images) to allow  for appropriate medical decision making by the Practitioner; and/or  In rare instances, security protocols could fail, causing a breach of personal health information.  Furthermore, I acknowledge that it is my responsibility to provide information about my medical history, conditions and care that is complete and accurate to the best of my ability. I acknowledge that Practitioner's advice, recommendations, and/or decision may be based on factors not within their control, such as incomplete or inaccurate data provided by me or distortions of diagnostic images or specimens that may result from electronic transmissions. I understand that the practice of medicine is not an exact science and that Practitioner makes no warranties or guarantees regarding treatment outcomes. I acknowledge that a copy of this consent can be made available to me via my patient portal [facility] MyChart), or I can request a printed copy by calling the office of [facility].    I understand that my insurance will be billed for this visit.   I have read or had this consent read to me. I understand the contents of this consent, which adequately explains the benefits and risks of the Services being provided via telemedicine.  I have been provided ample opportunity to ask questions regarding this consent and the Services and have had my questions answered to my satisfaction. I give my informed consent for the services to be provided through the use of telemedicine in my medical care

## 2024-06-10 ENCOUNTER — Ambulatory Visit: Attending: Cardiology | Admitting: Emergency Medicine

## 2024-06-10 DIAGNOSIS — Z01810 Encounter for preprocedural cardiovascular examination: Secondary | ICD-10-CM | POA: Diagnosis not present

## 2024-06-10 NOTE — Progress Notes (Signed)
 Virtual Visit via Telephone Note   Because of AH BOTT co-morbid illnesses, he is at least at moderate risk for complications without adequate follow up.  This format is felt to be most appropriate for this patient at this time.  Due to technical limitations with video connection (technology), today's appointment will be conducted as an audio only telehealth visit, and BOSS DANIELSEN verbally agreed to proceed in this manner.   All issues noted in this document were discussed and addressed.  No physical exam could be performed with this format.  Evaluation Performed:  Preoperative cardiovascular risk assessment _____________   Date:  06/10/2024   Patient ID:  Andrew Williamson, DOB 1946/10/17, MRN [id number] Patient Location:  Home Provider location:   Office  Primary Care Provider:  Regino Slater, MD Primary Cardiologist:  Oneil Parchment, MD  Chief Complaint / Patient Profile   78 y.o. y/o male with a h/o coronary disease s/p CABG in 2018, NSTEMI in 2019 s/p DES to OM, prediabetes, hyperlipidemia who is pending colonoscopy on 07/07/2024 by [facility] and presents today for telephonic preoperative cardiovascular risk assessment.  History of Present Illness    JAHEIM Williamson is a 78 y.o. male who presents via audio/video conferencing for a telehealth visit today.  Pt was last seen in cardiology clinic on 11/12/2023 by [place], PA.  At that time RICCI DIROCCO was doing well.  The patient is now pending procedure as outlined above. Since his last visit, he denies chest pain, shortness of breath, lower extremity edema, fatigue, palpitations, melena, hematuria, hemoptysis, diaphoresis, weakness, presyncope, syncope, orthopnea, and PND.  Today patient is doing well overall.  He is without acute cardiovascular concerns or complaints at this time.  He denies any chest pain or shortness of breath today.  He stays relatively active as he walks often, cleans the house, gets groceries, and goes  bowling every Monday without exertional symptoms or limitation.  Overall he is able to complete greater than 4 METS without symptoms.  Past Medical History    Past Medical History:  Diagnosis Date   Allergic rhinitis    CAD (coronary artery disease) 01/07/2017   a - Myoview  12/17:  EF 55, inf-lateral ischemia; intermediate risk // b - LHC 12/05/16: oLM 50, oLAD 40, dLAD 50, RI 25, oLCx 95, mLCx 95, RCA luminal irregs // c - Echo 12/17: EF 55-60, no RWMA, Gr 1 DD // d. S/p CABG 1/18 (L-LAD, S-RI, S-LCx) // NSTEMI 10.2019 -([place], [place]) - LHC: L-LAD ok, S-LCx and S-RI occluded; PCI: DES x 2 to RI branch of LCx; Echo: EF 60-65   Diabetes mellitus without complication (HCC)    with microalbuminuria   DJD (degenerative joint disease)    hips   ED (erectile dysfunction)    GERD (gastroesophageal reflux disease)    Gynecomastia, male    History of echocardiogram    Echo 12/17: EF 55-60, normal wall motion, grade 1 diastolic dysfunction   History of nuclear stress test    Myoview  12/17: EF 55, inf-lateral ischemia; intermediate risk   HLD (hyperlipidemia)    hx of ? elevated LFTs on statin Rx   Hypertension    Obesity    Prostate cancer [facility])    s/p radical prostatectomy   Spinal stenosis    Past Surgical History:  Procedure Laterality Date   CARDIAC CATHETERIZATION N/A 12/05/2016   Procedure: Left Heart Cath and Coronary Angiography;  Surgeon: Candyce GORMAN Reek, MD;  Location[facility] INVASIVE  CV LAB;  Service: Cardiovascular;  Laterality: N/A;   CARDIAC CATHETERIZATION N/A 12/05/2016   Procedure: Intravascular Ultrasound/IVUS;  Surgeon: Candyce GORMAN Reek, MD;  Location: [facility] INVASIVE CV LAB;  Service: Cardiovascular;  Laterality: N/A;   COLONOSCOPY  06/2014   FU 10 years   CORONARY ARTERY BYPASS GRAFT N/A 12/19/2016   Procedure: CORONARY ARTERY BYPASS GRAFTING (CABG) x three , using left internal mammary artery and right leg greater saphenous vein harvested endoscopically; SVG to  Ramus, SVG to circ, LIMA to LAD ;  Surgeon: Dallas KATHEE Jude, MD;  Location: [facility] OR;  Service: Open Heart Surgery;  Laterality: N/A;   LAPAROSCOPIC CHOLECYSTECTOMY     2012   ROBOT ASSISTED LAPAROSCOPIC RADICAL PROSTATECTOMY N/A 08/11/2015   Procedure: ROBOTIC ASSISTED LAPAROSCOPIC RADICAL PROSTATECTOMY;  Surgeon: Norleen Seltzer, MD;  Location: [facility] ORS;  Service: Urology;  Laterality: N/A;   TEE WITHOUT CARDIOVERSION N/A 12/19/2016   Procedure: TRANSESOPHAGEAL ECHOCARDIOGRAM (TEE);  Surgeon: Dallas KATHEE Jude, MD;  Location[facility] OR;  Service: Open Heart Surgery;  Laterality: N/A;    Allergies  Allergies  Allergen Reactions   Thimerosal (Thiomersal) Other (See Comments)    Red eyes     Home Medications    Prior to Admission medications   Medication Sig Start Date End Date Taking? Authorizing Provider  Accu-Chek FastClix Lancets MISC Use as directed once daily 05/02/22   Koirala, Dibas, MD  amoxicillin-clavulanate (AUGMENTIN) 875-125 MG tablet Take 1 tablet by mouth 2 (two) times daily. 06/01/24   [provider]  AREXVY 120 MCG/0.5ML injection Inject 0.5 mLs into the muscle once. 09/13/23   [provider]  bacitracin  ointment Apply 1 Application topically 2 (two) times daily. 11/12/22   Christopher Savannah, PA-C  clopidogrel  (PLAVIX ) 75 MG tablet TAKE 1 TABLET BY MOUTH EVERY DAY 01/02/24   Jeffrie Oneil BROCKS, MD  lansoprazole (PREVACID) 30 MG capsule Take 1 capsule by mouth daily. 04/23/18   [provider]  lisinopril  (PRINIVIL ,ZESTRIL ) 5 MG tablet Take 1 tablet (5 mg total) by mouth daily. 12/25/16   Lucien Orren SAILOR, PA-C  metFORMIN  (GLUCOPHAGE ) 1000 MG tablet Take 1,000 mg by mouth 2 (two) times daily with a meal.    [provider]  Multiple Vitamin (MULTIVITAMIN) capsule Take 1 capsule by mouth daily.    [provider]  nitroGLYCERIN  (NITROSTAT ) 0.4 MG SL tablet Place 1 tablet (0.4 mg total) under the tongue every 5 (five) minutes as needed for chest pain. 11/12/23  02/10/24  Lucien Orren SAILOR, PA-C  rosuvastatin (CRESTOR) 20 MG tablet Take 20 mg by mouth daily.     [provider]    Physical Exam    Vital Signs:  KINGSTON SHAWGO does not have vital signs available for review today.  Given telephonic nature of communication, physical exam is limited. AAOx3. NAD. Normal affect.  Speech and respirations are unlabored.  Accessory Clinical Findings    None  Assessment & Plan    1.  Preoperative Cardiovascular Risk Assessment: According to the Revised Cardiac Risk Index (RCRI), his Perioperative Risk of Major Cardiac Event is (%): 0.9. His Functional Capacity in METs is: 6.36 according to the Duke Activity Status Index (DASI). Therefore, based on ACC/AHA guidelines, patient would be at acceptable risk for the planned procedure without further cardiovascular testing.   The patient was advised that if he develops new symptoms prior to surgery to contact our office to arrange for a follow-up visit, and he verbalized understanding.  He may hold Plavix   for 5 days prior to procedure. Please resume Plavix  as soon as possible postprocedure, at the discretion of the surgeon.    A copy of this note will be routed to requesting surgeon.  Time:   Today, I have spent 6 minutes with the patient with telehealth technology discussing medical history, symptoms, and management plan.     Lum LITTIE Louis, NP  06/10/2024, 2:29 PM

## 2024-07-07 DIAGNOSIS — D122 Benign neoplasm of ascending colon: Secondary | ICD-10-CM | POA: Diagnosis not present

## 2024-07-07 DIAGNOSIS — K5730 Diverticulosis of large intestine without perforation or abscess without bleeding: Secondary | ICD-10-CM | POA: Diagnosis not present

## 2024-07-07 DIAGNOSIS — Z1211 Encounter for screening for malignant neoplasm of colon: Secondary | ICD-10-CM | POA: Diagnosis not present

## 2024-07-07 DIAGNOSIS — D124 Benign neoplasm of descending colon: Secondary | ICD-10-CM | POA: Diagnosis not present

## 2024-07-09 DIAGNOSIS — D122 Benign neoplasm of ascending colon: Secondary | ICD-10-CM | POA: Diagnosis not present

## 2024-07-09 DIAGNOSIS — D124 Benign neoplasm of descending colon: Secondary | ICD-10-CM | POA: Diagnosis not present

## 2024-08-11 DIAGNOSIS — M549 Dorsalgia, unspecified: Secondary | ICD-10-CM | POA: Diagnosis not present

## 2024-08-11 DIAGNOSIS — Z23 Encounter for immunization: Secondary | ICD-10-CM | POA: Diagnosis not present

## 2024-08-11 DIAGNOSIS — M791 Myalgia, unspecified site: Secondary | ICD-10-CM | POA: Diagnosis not present

## 2024-09-08 DIAGNOSIS — M5431 Sciatica, right side: Secondary | ICD-10-CM | POA: Diagnosis not present

## 2024-09-08 DIAGNOSIS — M21371 Foot drop, right foot: Secondary | ICD-10-CM | POA: Diagnosis not present

## 2024-09-09 ENCOUNTER — Other Ambulatory Visit: Payer: Self-pay | Admitting: Family Medicine

## 2024-09-09 DIAGNOSIS — M5431 Sciatica, right side: Secondary | ICD-10-CM

## 2024-09-09 DIAGNOSIS — M21371 Foot drop, right foot: Secondary | ICD-10-CM

## 2024-09-22 ENCOUNTER — Ambulatory Visit
Admission: RE | Admit: 2024-09-22 | Discharge: 2024-09-22 | Disposition: A | Discharge status: Home / Self Care | Source: Ambulatory Visit | Attending: Family Medicine | Admitting: Family Medicine

## 2024-09-22 DIAGNOSIS — M47816 Spondylosis without myelopathy or radiculopathy, lumbar region: Secondary | ICD-10-CM | POA: Diagnosis not present

## 2024-09-22 DIAGNOSIS — M21371 Foot drop, right foot: Secondary | ICD-10-CM

## 2024-09-22 DIAGNOSIS — M5431 Sciatica, right side: Secondary | ICD-10-CM

## 2024-09-22 DIAGNOSIS — M5127 Other intervertebral disc displacement, lumbosacral region: Secondary | ICD-10-CM | POA: Diagnosis not present

## 2024-09-30 DIAGNOSIS — M21371 Foot drop, right foot: Secondary | ICD-10-CM | POA: Diagnosis not present

## 2024-09-30 DIAGNOSIS — M5116 Intervertebral disc disorders with radiculopathy, lumbar region: Secondary | ICD-10-CM | POA: Diagnosis not present

## 2024-09-30 DIAGNOSIS — M545 Low back pain, unspecified: Secondary | ICD-10-CM | POA: Diagnosis not present

## 2024-10-06 DIAGNOSIS — M545 Low back pain, unspecified: Secondary | ICD-10-CM | POA: Diagnosis not present

## 2024-10-08 DIAGNOSIS — M5416 Radiculopathy, lumbar region: Secondary | ICD-10-CM | POA: Diagnosis not present

## 2024-10-14 DIAGNOSIS — M545 Low back pain, unspecified: Secondary | ICD-10-CM | POA: Diagnosis not present

## 2024-10-20 DIAGNOSIS — M545 Low back pain, unspecified: Secondary | ICD-10-CM | POA: Diagnosis not present

## 2024-10-27 DIAGNOSIS — M545 Low back pain, unspecified: Secondary | ICD-10-CM | POA: Diagnosis not present

## 2024-11-03 DIAGNOSIS — H25013 Cortical age-related cataract, bilateral: Secondary | ICD-10-CM | POA: Diagnosis not present

## 2024-11-03 DIAGNOSIS — E119 Type 2 diabetes mellitus without complications: Secondary | ICD-10-CM | POA: Diagnosis not present

## 2024-11-03 DIAGNOSIS — H2513 Age-related nuclear cataract, bilateral: Secondary | ICD-10-CM | POA: Diagnosis not present

## 2024-11-03 DIAGNOSIS — H43812 Vitreous degeneration, left eye: Secondary | ICD-10-CM | POA: Diagnosis not present

## 2024-11-10 DIAGNOSIS — M545 Low back pain, unspecified: Secondary | ICD-10-CM | POA: Diagnosis not present

## 2024-11-17 DIAGNOSIS — M545 Low back pain, unspecified: Secondary | ICD-10-CM | POA: Diagnosis not present

## 2024-12-22 ENCOUNTER — Ambulatory Visit: Admitting: Physician Assistant

## 2024-12-23 ENCOUNTER — Other Ambulatory Visit: Payer: Self-pay | Admitting: Cardiology

## 2024-12-23 NOTE — Telephone Encounter (Signed)
 In accordance with refill protocols, please review and address the following requirements before this medication refill can be authorized:  Labs

## 2024-12-28 NOTE — Progress Notes (Unsigned)
 " Cardiology Office Note:  .   Date:  12/29/2024  ID:  JOSHVA LABRECK, DOB 1946-10-19, MRN [id number] PCP: Regino Slater, MD  [facility] Providers Cardiologist:  Oneil Parchment, MD {  History of Present Illness: .   Andrew Williamson is a 79 y.o. male with a past medical history of CAD status post CABG in 2018 (I talked to him on the phone after his CABG and was scrubbed on his surgery).  Had an NSTEMI in 2019 in [place] and his OM was stented, SVG grafts are down and LIMA was patent.  Started on isosorbide  and unfortunately obtain a headache.  Still having a light headache.  When he was seen last year he was having trouble with less energy but no chest pain or shortness of breath.  He is a widowed retired special educational needs teacher.  I saw him 12/25, he has a history of CABG in 2018 and stent placement in 2019, presents for a routine follow-up. He reports no current chest pain or shortness of breath and has not needed to use nitroglycerin . The patient's exercise tolerance has improved since the stent placement. He is currently on Plavix  and Lisinopril . The patient also mentions being prediabetic and acknowledges not adhering strictly to his diet. He has enjoyed spending time with his male companion whom he used to square dance with.   Reports no shortness of breath nor dyspnea on exertion. Reports no chest pain, pressure, or tightness. No edema, orthopnea, PND. Reports no palpitations.   Discussed the use of AI scribe software for clinical note transcription with the patient, who gave verbal consent to proceed.  Today, he  presents coronary artery disease who presents for a routine cardiovascular follow-up.  He feels well from a cardiac standpoint with no chest pain or shortness of breath. He has CAD treated with prior CABG and later stent placement for graft failure. He occasionally checks his heart rhythm with a monitor and has not had abnormal readings.  He takes Plavix  75 mg, lisinopril   5 mg, metformin  1000 mg twice daily, and Crestor 20 mg, usually in the morning. He sometimes forgets doses, especially when socializing.  He has prediabetes and is working to lower his A1c. Recent values were 6.9, with repeat results of 6.7 and 6.6 at this visit.  He has chronic back pain that limits activity and has recently reduced walking. He resumed bowling 3 weeks ago. He notes swelling in his right leg.  He has elevated LDL that is well controlled on Crestor, with a recent LDL of 37, improved from 45 last summer.   Reports no shortness of breath nor dyspnea on exertion. Reports no chest pain, pressure, or tightness. No edema, orthopnea, PND. Reports no palpitations.   Discussed the use of AI scribe software for clinical note transcription with the patient, who gave verbal consent to proceed.  ROS: Pertinent ROS in HPI  Studies Reviewed: SABRA   EKG Interpretation Date/Time:  Tuesday December 29 2024 15:14:10 EST Ventricular Rate:  87 PR Interval:  178 QRS Duration:  88 QT Interval:  358 QTC Calculation: 430 R Axis:   6  Text Interpretation: Normal sinus rhythm Normal ECG When compared with ECG of 12-Nov-2023 10:45, No significant change was found Confirmed by Lucien Blanc [phone]) on 12/29/2024 3:31:51 PM    Reviewed scanned in cardiac cath report from 02/17/2019 Summary of the report: Loss of 2 of his 3 bypasses both saphenous vein grafts were occluded at the aorta anastomosis site.  Intact LIMA to LAD.  Underwent successful stent implantation of the ostium and mid to distal portion of the ramus intermedius branch of the circumflex coronary artery.  EF was 69%.      Physical Exam:   VS:  BP (!) 114/50   Pulse 87   Ht 5' 9 (1.753 m)   Wt 194 lb (88 kg)   SpO2 97%   BMI 28.65 kg/m    Wt Readings from Last 3 Encounters:  12/29/24 194 lb (88 kg)  11/12/23 194 lb 9.6 oz (88.3 kg)  11/13/22 197 lb (89.4 kg)    GEN: Well nourished, well developed in no acute distress NECK: No  JVD; No carotid bruits CARDIAC: RRR, no murmurs, rubs, gallops RESPIRATORY:  Clear to auscultation without rales, wheezing or rhonchi  ABDOMEN: Soft, non-tender, non-distended EXTREMITIES:  No edema; No deformity   ASSESSMENT AND PLAN: .    Coronary artery disease with history of CABG and stents, currently stable Coronary artery disease stable, no recent angina or dyspnea. EKG shows sinus rhythm, heart rate in 80s, no significant arrhythmias. Previous CABG and stents without current issues. Occasional skipped beats not concerning unless frequent. - Continue Plavix  75 mg, lisinopril  5 mg, metformin  1000 mg twice daily. - Take Crestor (rosuvastatin) at night for better absorption and to mitigate fatigue. - Use personal EKG device for monitoring skipped beats. - Regular follow-up with family doctor for routine blood work.  Mixed hyperlipidemia Well-controlled with LDL at 37 mg/dL. Discussed benefits of taking Crestor at night for improved absorption and reduced fatigue. - Take Crestor (rosuvastatin) at night with evening dose of metformin . - Continue monitoring lipid levels with routine blood work.  Dispo: He can follow-up in a year with me or Dr. Jeffrie  Signed, Orren LOISE Fabry, PA-C   "

## 2024-12-29 ENCOUNTER — Encounter: Payer: Self-pay | Admitting: Physician Assistant

## 2024-12-29 ENCOUNTER — Ambulatory Visit: Admitting: Physician Assistant

## 2024-12-29 VITALS — BP 114/50 | HR 87 | Ht 69.0 in | Wt 194.0 lb

## 2024-12-29 DIAGNOSIS — I209 Angina pectoris, unspecified: Secondary | ICD-10-CM | POA: Insufficient documentation

## 2024-12-29 DIAGNOSIS — E782 Mixed hyperlipidemia: Secondary | ICD-10-CM | POA: Diagnosis not present

## 2024-12-29 DIAGNOSIS — Z951 Presence of aortocoronary bypass graft: Secondary | ICD-10-CM | POA: Insufficient documentation

## 2024-12-29 DIAGNOSIS — Z01810 Encounter for preprocedural cardiovascular examination: Secondary | ICD-10-CM | POA: Diagnosis not present

## 2024-12-29 DIAGNOSIS — I25119 Atherosclerotic heart disease of native coronary artery with unspecified angina pectoris: Secondary | ICD-10-CM | POA: Insufficient documentation

## 2024-12-29 NOTE — Patient Instructions (Signed)
 Medication Instructions:  Your physician recommends that you continue on your current medications as directed. Please refer to the Current Medication list given to you today.  *If you need a refill on your cardiac medications before your next appointment, please call your pharmacy*  Lab Work: None ordered  If you have labs (blood work) drawn today and your tests are completely normal, you will receive your results only by: MyChart Message (if you have MyChart) OR A paper copy in the mail If you have any lab test that is abnormal or we need to change your treatment, we will call you to review the results.  Testing/Procedures: None ordered  Follow-Up: At [facility], you and your health needs are our priority.  As part of our continuing mission to provide you with exceptional heart care, our providers are all part of one team.  This team includes your primary Cardiologist (physician) and Advanced Practice Providers or APPs (Physician Assistants and Nurse Practitioners) who all work together to provide you with the care you need, when you need it.  Your next appointment:   1 year(s)  Provider:   Dorothye Gathers, MD    We recommend signing up for the patient portal called "MyChart".  Sign up information is provided on this After Visit Summary.  MyChart is used to connect with patients for Virtual Visits (Telemedicine).  Patients are able to view lab/test results, encounter notes, upcoming appointments, etc.  Non-urgent messages can be sent to your provider as well.   To learn more about what you can do with MyChart, go to [email or web address].   Other Instructions
